# Patient Record
Sex: Male | Born: 1975 | Race: Black or African American | Hispanic: No | Marital: Single | Smoking: Former smoker
Health system: Southern US, Community
[De-identification: ages and names within clinical notes are randomized; demographics above are authoritative.]

## PROBLEM LIST (undated history)

## (undated) DIAGNOSIS — E119 Type 2 diabetes mellitus without complications: Secondary | ICD-10-CM

## (undated) DIAGNOSIS — F419 Anxiety disorder, unspecified: Secondary | ICD-10-CM

## (undated) DIAGNOSIS — I10 Essential (primary) hypertension: Secondary | ICD-10-CM

## (undated) DIAGNOSIS — G629 Polyneuropathy, unspecified: Secondary | ICD-10-CM

## (undated) DIAGNOSIS — G8929 Other chronic pain: Secondary | ICD-10-CM

## (undated) DIAGNOSIS — M1990 Unspecified osteoarthritis, unspecified site: Secondary | ICD-10-CM

## (undated) DIAGNOSIS — K219 Gastro-esophageal reflux disease without esophagitis: Secondary | ICD-10-CM

## (undated) DIAGNOSIS — C61 Malignant neoplasm of prostate: Secondary | ICD-10-CM

## (undated) DIAGNOSIS — G4730 Sleep apnea, unspecified: Secondary | ICD-10-CM

## (undated) DIAGNOSIS — G459 Transient cerebral ischemic attack, unspecified: Secondary | ICD-10-CM

## (undated) DIAGNOSIS — Z973 Presence of spectacles and contact lenses: Secondary | ICD-10-CM

## (undated) DIAGNOSIS — IMO0001 Reserved for inherently not codable concepts without codable children: Secondary | ICD-10-CM

## (undated) HISTORY — PX: OTHER SURGICAL HISTORY: SHX169

## (undated) HISTORY — DX: Type 2 diabetes mellitus without complications: E11.9

## (undated) HISTORY — PX: ELBOW FRACTURE SURGERY: SHX616

## (undated) HISTORY — PX: GASTRIC FUNDOPLICATION: SHX226

---

## 1999-12-22 ENCOUNTER — Encounter: Payer: Self-pay | Admitting: Emergency Medicine

## 1999-12-22 ENCOUNTER — Emergency Department (HOSPITAL_COMMUNITY): Admission: EM | Admit: 1999-12-22 | Discharge: 1999-12-22 | Payer: Self-pay | Admitting: *Deleted

## 2016-04-12 ENCOUNTER — Emergency Department (HOSPITAL_COMMUNITY)
Admission: EM | Admit: 2016-04-12 | Discharge: 2016-04-12 | Disposition: A | Discharge status: Home / Self Care | Payer: BLUE CROSS/BLUE SHIELD | Attending: Emergency Medicine | Admitting: Emergency Medicine

## 2016-04-12 ENCOUNTER — Encounter (HOSPITAL_COMMUNITY): Payer: Self-pay

## 2016-04-12 ENCOUNTER — Emergency Department (HOSPITAL_COMMUNITY): Payer: BLUE CROSS/BLUE SHIELD

## 2016-04-12 DIAGNOSIS — Z79899 Other long term (current) drug therapy: Secondary | ICD-10-CM | POA: Insufficient documentation

## 2016-04-12 DIAGNOSIS — M5432 Sciatica, left side: Secondary | ICD-10-CM | POA: Diagnosis not present

## 2016-04-12 DIAGNOSIS — F17200 Nicotine dependence, unspecified, uncomplicated: Secondary | ICD-10-CM | POA: Insufficient documentation

## 2016-04-12 DIAGNOSIS — M25571 Pain in right ankle and joints of right foot: Secondary | ICD-10-CM

## 2016-04-12 HISTORY — DX: Reserved for inherently not codable concepts without codable children: IMO0001

## 2016-04-12 HISTORY — DX: Gastro-esophageal reflux disease without esophagitis: K21.9

## 2016-04-12 MED ORDER — CYCLOBENZAPRINE HCL 10 MG PO TABS: 10.0000 mg | ORAL_TABLET | Freq: Two times a day (BID) | ORAL | Status: DC | PRN

## 2016-04-12 MED ORDER — TRAMADOL HCL 50 MG PO TABS: 50.0000 mg | ORAL_TABLET | Freq: Four times a day (QID) | ORAL | Status: DC | PRN

## 2016-04-12 NOTE — ED Provider Notes (Signed)
CSN: [id number]     Arrival date & time 04/12/16  1949 History   First MD Initiated Contact with Patient 04/12/16 2019     Chief Complaint  Patient presents with  . Ankle Pain     (Consider location/radiation/quality/duration/timing/severity/associated sxs/prior Treatment) HPI Mark Anthony is a 40 y.o. male who presents to the ED with right ankle pain that has been chronic for years. Patient reports that his doctor is at the [facility] hospital. Patient injured his ankle and after having an MRI reports that they dx a ruptured achilles tendon. He has had problems off and on but for the past 3 weeks his symptoms have increased. As a result he has been putting most of his weight on his left slide and now he is having lower back pain that radiates to the left buttock. He has been taking aleve without relief. Patient denies loss of control of bladder or bowels.    Past Medical History  Diagnosis Date  . Reflux    Past Surgical History  Procedure Laterality Date  . Elbow fracture surgery     History reviewed. No pertinent family history. Social History  Substance Use Topics  . Smoking status: Current Every Day Smoker  . Smokeless tobacco: None  . Alcohol Use: Yes     Comment: occasional    Review of Systems Negative except as stated in HPI   Allergies  Vicodin  Home Medications   Prior to Admission medications   Medication Sig Start Date End Date Taking? Authorizing Provider  naproxen sodium (ALEVE) 220 MG tablet Take 220 mg by mouth 2 (two) times daily as needed (pain).   Yes Historical Provider, MD  cyclobenzaprine (FLEXERIL) 10 MG tablet Take 1 tablet (10 mg total) by mouth 2 (two) times daily as needed for muscle spasms. 04/12/16   Hope Bunnie Pion, NP  traMADol (ULTRAM) 50 MG tablet Take 1 tablet (50 mg total) by mouth every 6 (six) hours as needed. 04/12/16   Hope Bunnie Pion, NP   Pulse 86  Temp(Src) 98 F (36.7 C) (Oral)  Ht 6\' 1"  (1.854 m)  Wt 124.739 kg  BMI 36.29 kg/m2  SpO2  99% Physical Exam  Constitutional: He is oriented to person, place, and time. He appears well-developed and well-nourished. No distress.  HENT:  Head: Normocephalic and atraumatic.  Eyes: EOM are normal.  Neck: Normal range of motion. Neck supple.  Cardiovascular: Normal rate and regular rhythm.   Pulmonary/Chest: Effort normal and breath sounds normal.  Abdominal: Soft. Bowel sounds are normal. There is no tenderness.  Musculoskeletal: Normal range of motion. He exhibits no edema.       Right ankle: He exhibits no swelling, no ecchymosis, no deformity, no laceration and normal pulse. Tenderness.       Lumbar back: He exhibits tenderness and spasm. He exhibits normal range of motion, no deformity and normal pulse.       Back:  Pedal pulses 2+, adequate circulation, patient can planter flex and dorsiflex without difficulty. Pain with straight leg raises on the left.   Neurological: He is alert and oriented to person, place, and time. He has normal strength. No sensory deficit. Gait normal.  Reflex Scores:      Bicep reflexes are 2+ on the right side and 2+ on the left side.      Brachioradialis reflexes are 2+ on the right side and 2+ on the left side.      Patellar reflexes are 2+ on the right side  and 2+ on the left side. Skin: Skin is warm and dry.  Psychiatric: He has a normal mood and affect. His behavior is normal.  Nursing note and vitals reviewed.   ED Course  Procedures (including critical care time) Labs Review Labs Reviewed - No data to display  Imaging Review Dg Ankle Complete Right  04/12/2016  CLINICAL DATA:  RIGHT ANKLE PAIN, PATIENT STATES " HE HAD A AN OLD INJURY TO HIS RIGHT ANKLE FROM YEARS AGO, NO RECENT INJURY BUT HIS ANKLE HAS BEEN HURTING A LOT IN THE LAST COUPLE MONTHS" EXAM: RIGHT ANKLE - COMPLETE 3+ VIEW COMPARISON:  None. FINDINGS: There is no evidence of fracture, dislocation, or joint effusion. There is no evidence of arthropathy or other focal bone  abnormality. Soft tissues are unremarkable. IMPRESSION: Negative. Electronically Signed   By: Lajean Manes M.D.   On: 04/12/2016 20:16     MDM  40 y.o. male with chronic ankle pain and new onset low back pain stable for d/c without focal neuro deficits. Will treat for muscle spasm and inflammation and patient given ortho referral. Discussed with the patient and all questioned fully answered. He will return if any problems arise.   Final diagnoses:  Sciatica, left  Ankle pain, right       [facility], NP 04/13/16 0105  Daleen Bo, MD 04/13/16 [phone]

## 2016-04-12 NOTE — ED Notes (Signed)
Pt to radiology ambulating heel to toe without change in gait- he will return to [facility] after radiology for eval

## 2016-04-12 NOTE — ED Notes (Signed)
Pt reports r ankle pain from injury in mid 2000s, followed by [facility] now with continued ankle pain as well as back pain, hip pain and bilateral pain to legs. He reports he has never been seen by an orthopaeic physician

## 2016-04-12 NOTE — Discharge Instructions (Signed)
Continue your Aleve and follow up with Dr. Aline Brochure.  Do not take the medications we give you while driving or working because they will make you sleepy.

## 2016-04-12 NOTE — ED Notes (Signed)
C/o right ankle pain X3 weeks. Patient also states left leg pain, and left lower back pain. Patient denies new injury to eight ankle, and denies injury to left leg.

## 2020-01-19 ENCOUNTER — Other Ambulatory Visit (HOSPITAL_COMMUNITY): Payer: Self-pay | Admitting: Orthopedic Surgery

## 2020-01-25 ENCOUNTER — Other Ambulatory Visit: Payer: Self-pay

## 2020-01-25 ENCOUNTER — Encounter (HOSPITAL_BASED_OUTPATIENT_CLINIC_OR_DEPARTMENT_OTHER): Payer: Self-pay | Admitting: Orthopedic Surgery

## 2020-01-30 ENCOUNTER — Other Ambulatory Visit: Payer: Self-pay

## 2020-01-30 ENCOUNTER — Other Ambulatory Visit (HOSPITAL_COMMUNITY)
Admission: RE | Admit: 2020-01-30 | Discharge: 2020-01-30 | Disposition: A | Discharge status: Home / Self Care | Payer: No Typology Code available for payment source | Source: Ambulatory Visit | Attending: Orthopedic Surgery | Admitting: Orthopedic Surgery

## 2020-01-30 DIAGNOSIS — Z20822 Contact with and (suspected) exposure to covid-19: Secondary | ICD-10-CM | POA: Insufficient documentation

## 2020-01-30 DIAGNOSIS — Z01812 Encounter for preprocedural laboratory examination: Secondary | ICD-10-CM | POA: Insufficient documentation

## 2020-01-30 LAB — SARS CORONAVIRUS 2 (TAT 6-24 HRS): SARS Coronavirus 2: NEGATIVE

## 2020-02-02 ENCOUNTER — Encounter (HOSPITAL_BASED_OUTPATIENT_CLINIC_OR_DEPARTMENT_OTHER)
Admission: RE | Disposition: A | Discharge status: Home / Self Care | Payer: Self-pay | Source: Home / Self Care | Attending: Orthopedic Surgery

## 2020-02-02 ENCOUNTER — Ambulatory Visit (HOSPITAL_BASED_OUTPATIENT_CLINIC_OR_DEPARTMENT_OTHER): Payer: No Typology Code available for payment source | Admitting: Certified Registered"

## 2020-02-02 ENCOUNTER — Ambulatory Visit (HOSPITAL_BASED_OUTPATIENT_CLINIC_OR_DEPARTMENT_OTHER)
Admission: RE | Admit: 2020-02-02 | Discharge: 2020-02-02 | Disposition: A | Discharge status: Home / Self Care | Payer: No Typology Code available for payment source | Attending: Orthopedic Surgery | Admitting: Orthopedic Surgery

## 2020-02-02 ENCOUNTER — Other Ambulatory Visit: Payer: Self-pay

## 2020-02-02 ENCOUNTER — Encounter (HOSPITAL_BASED_OUTPATIENT_CLINIC_OR_DEPARTMENT_OTHER): Payer: Self-pay | Admitting: Orthopedic Surgery

## 2020-02-02 DIAGNOSIS — E669 Obesity, unspecified: Secondary | ICD-10-CM | POA: Insufficient documentation

## 2020-02-02 DIAGNOSIS — F17200 Nicotine dependence, unspecified, uncomplicated: Secondary | ICD-10-CM | POA: Insufficient documentation

## 2020-02-02 DIAGNOSIS — F17210 Nicotine dependence, cigarettes, uncomplicated: Secondary | ICD-10-CM | POA: Diagnosis not present

## 2020-02-02 DIAGNOSIS — Z6837 Body mass index (BMI) 37.0-37.9, adult: Secondary | ICD-10-CM | POA: Diagnosis not present

## 2020-02-02 DIAGNOSIS — X58XXXA Exposure to other specified factors, initial encounter: Secondary | ICD-10-CM | POA: Diagnosis not present

## 2020-02-02 DIAGNOSIS — K219 Gastro-esophageal reflux disease without esophagitis: Secondary | ICD-10-CM | POA: Diagnosis not present

## 2020-02-02 DIAGNOSIS — I10 Essential (primary) hypertension: Secondary | ICD-10-CM | POA: Diagnosis not present

## 2020-02-02 DIAGNOSIS — M79661 Pain in right lower leg: Secondary | ICD-10-CM | POA: Diagnosis not present

## 2020-02-02 DIAGNOSIS — Z885 Allergy status to narcotic agent status: Secondary | ICD-10-CM | POA: Diagnosis not present

## 2020-02-02 DIAGNOSIS — G8929 Other chronic pain: Secondary | ICD-10-CM | POA: Insufficient documentation

## 2020-02-02 DIAGNOSIS — S86011A Strain of right Achilles tendon, initial encounter: Secondary | ICD-10-CM | POA: Insufficient documentation

## 2020-02-02 DIAGNOSIS — Y939 Activity, unspecified: Secondary | ICD-10-CM | POA: Insufficient documentation

## 2020-02-02 DIAGNOSIS — Z8673 Personal history of transient ischemic attack (TIA), and cerebral infarction without residual deficits: Secondary | ICD-10-CM | POA: Insufficient documentation

## 2020-02-02 HISTORY — PX: ACHILLES TENDON SURGERY: SHX542

## 2020-02-02 HISTORY — DX: Transient cerebral ischemic attack, unspecified: G45.9

## 2020-02-02 HISTORY — DX: Essential (primary) hypertension: I10

## 2020-02-02 SURGERY — REPAIR, TENDON, ACHILLES
Anesthesia: Regional | Site: Foot | Laterality: Right

## 2020-02-02 MED ORDER — DEXAMETHASONE SODIUM PHOSPHATE 4 MG/ML IJ SOLN
INTRAMUSCULAR | Status: DC | PRN
  Administered 2020-02-02: 5 mg via PERINEURAL
  Administered 2020-02-02: 10 mg via PERINEURAL

## 2020-02-02 MED ORDER — ACETAMINOPHEN 500 MG PO TABS
ORAL_TABLET | ORAL | Status: AC
  Filled 2020-02-02: qty 2

## 2020-02-02 MED ORDER — PROPOFOL 500 MG/50ML IV EMUL
INTRAVENOUS | Status: DC | PRN
  Administered 2020-02-02: 25 ug/kg/min via INTRAVENOUS

## 2020-02-02 MED ORDER — ACETAMINOPHEN 500 MG PO TABS
1000.0000 mg | ORAL_TABLET | Freq: Once | ORAL | Status: AC
  Administered 2020-02-02: 1000 mg via ORAL

## 2020-02-02 MED ORDER — RIVAROXABAN 10 MG PO TABS: 10.0000 mg | ORAL_TABLET | Freq: Every day | ORAL | 0 refills | Status: DC

## 2020-02-02 MED ORDER — CIPROFLOXACIN IN D5W 400 MG/200ML IV SOLN
INTRAVENOUS | Status: AC
  Filled 2020-02-02: qty 200

## 2020-02-02 MED ORDER — HYDROMORPHONE HCL 1 MG/ML IJ SOLN: 0.2500 mg | INTRAMUSCULAR | Status: DC | PRN

## 2020-02-02 MED ORDER — OXYCODONE HCL 5 MG/5ML PO SOLN: 5.0000 mg | Freq: Once | ORAL | Status: DC | PRN

## 2020-02-02 MED ORDER — FENTANYL CITRATE (PF) 100 MCG/2ML IJ SOLN
INTRAMUSCULAR | Status: AC
  Filled 2020-02-02: qty 2

## 2020-02-02 MED ORDER — OXYCODONE HCL 5 MG PO TABS: 5.0000 mg | ORAL_TABLET | Freq: Four times a day (QID) | ORAL | 0 refills | Status: AC | PRN

## 2020-02-02 MED ORDER — FENTANYL CITRATE (PF) 100 MCG/2ML IJ SOLN
50.0000 ug | INTRAMUSCULAR | Status: DC | PRN
  Administered 2020-02-02: 50 ug via INTRAVENOUS

## 2020-02-02 MED ORDER — EPHEDRINE 5 MG/ML INJ
INTRAVENOUS | Status: AC
  Filled 2020-02-02: qty 30

## 2020-02-02 MED ORDER — PROPOFOL 10 MG/ML IV BOLUS
INTRAVENOUS | Status: DC | PRN
  Administered 2020-02-02: 200 mg via INTRAVENOUS

## 2020-02-02 MED ORDER — OXYCODONE HCL 5 MG PO TABS: 5.0000 mg | ORAL_TABLET | Freq: Once | ORAL | Status: DC | PRN

## 2020-02-02 MED ORDER — SODIUM CHLORIDE 0.9 % IV SOLN: INTRAVENOUS | Status: DC

## 2020-02-02 MED ORDER — PROMETHAZINE HCL 25 MG/ML IJ SOLN: 6.2500 mg | INTRAMUSCULAR | Status: DC | PRN

## 2020-02-02 MED ORDER — DEXAMETHASONE SODIUM PHOSPHATE 10 MG/ML IJ SOLN
INTRAMUSCULAR | Status: AC
  Filled 2020-02-02: qty 1

## 2020-02-02 MED ORDER — LACTATED RINGERS IV SOLN: INTRAVENOUS | Status: DC

## 2020-02-02 MED ORDER — ONDANSETRON HCL 4 MG PO TABS: 4.0000 mg | ORAL_TABLET | Freq: Three times a day (TID) | ORAL | 1 refills | Status: DC | PRN

## 2020-02-02 MED ORDER — CEFAZOLIN SODIUM-DEXTROSE 2-4 GM/100ML-% IV SOLN
2.0000 g | INTRAVENOUS | Status: AC
  Administered 2020-02-02: 2 g via INTRAVENOUS

## 2020-02-02 MED ORDER — GLYCOPYRROLATE 0.2 MG/ML IJ SOLN
INTRAMUSCULAR | Status: DC | PRN
  Administered 2020-02-02: .1 mg via INTRAVENOUS

## 2020-02-02 MED ORDER — MIDAZOLAM HCL 2 MG/2ML IJ SOLN
INTRAMUSCULAR | Status: AC
  Filled 2020-02-02: qty 2

## 2020-02-02 MED ORDER — SUCCINYLCHOLINE CHLORIDE 20 MG/ML IJ SOLN
INTRAMUSCULAR | Status: DC | PRN
  Administered 2020-02-02: 140 mg via INTRAVENOUS

## 2020-02-02 MED ORDER — VANCOMYCIN HCL 500 MG IV SOLR
INTRAVENOUS | Status: DC | PRN
  Administered 2020-02-02: 500 mg via TOPICAL

## 2020-02-02 MED ORDER — FENTANYL CITRATE (PF) 100 MCG/2ML IJ SOLN
INTRAMUSCULAR | Status: DC | PRN
  Administered 2020-02-02: 100 ug via INTRAVENOUS

## 2020-02-02 MED ORDER — MIDAZOLAM HCL 2 MG/2ML IJ SOLN
1.0000 mg | INTRAMUSCULAR | Status: DC | PRN
  Administered 2020-02-02: 2 mg via INTRAVENOUS

## 2020-02-02 MED ORDER — KETOROLAC TROMETHAMINE 30 MG/ML IJ SOLN: 30.0000 mg | Freq: Once | INTRAMUSCULAR | Status: DC | PRN

## 2020-02-02 MED ORDER — CEFAZOLIN SODIUM-DEXTROSE 2-4 GM/100ML-% IV SOLN
INTRAVENOUS | Status: AC
  Filled 2020-02-02: qty 100

## 2020-02-02 MED ORDER — GLYCOPYRROLATE PF 0.2 MG/ML IJ SOSY
PREFILLED_SYRINGE | INTRAMUSCULAR | Status: AC
  Filled 2020-02-02: qty 1

## 2020-02-02 MED ORDER — LIDOCAINE 2% (20 MG/ML) 5 ML SYRINGE
INTRAMUSCULAR | Status: DC | PRN
  Administered 2020-02-02: 20 mg via INTRAVENOUS

## 2020-02-02 MED ORDER — ONDANSETRON HCL 4 MG/2ML IJ SOLN
INTRAMUSCULAR | Status: DC | PRN
  Administered 2020-02-02: 4 mg via INTRAVENOUS

## 2020-02-02 MED ORDER — ROPIVACAINE HCL 5 MG/ML IJ SOLN
INTRAMUSCULAR | Status: DC | PRN
  Administered 2020-02-02: 40 mL via PERINEURAL

## 2020-02-02 MED ORDER — 0.9 % SODIUM CHLORIDE (POUR BTL) OPTIME
TOPICAL | Status: DC | PRN
  Administered 2020-02-02: 120 mL

## 2020-02-02 SURGICAL SUPPLY — 99 items
APL PRP STRL LF DISP 70% ISPRP (MISCELLANEOUS) ×2
BANDAGE ESMARK 6X9 LF (GAUZE/BANDAGES/DRESSINGS) ×2 IMPLANT
BLADE AVERAGE 25MMX9MM (BLADE)
BLADE AVERAGE 25X9 (BLADE) IMPLANT
BLADE MICRO SAGITTAL (BLADE) IMPLANT
BLADE SURG 15 STRL LF DISP TIS (BLADE) ×4 IMPLANT
BLADE SURG 15 STRL SS (BLADE) ×8
BNDG CMPR 9X6 STRL LF SNTH (GAUZE/BANDAGES/DRESSINGS) ×2
BNDG COHESIVE 4X5 TAN STRL (GAUZE/BANDAGES/DRESSINGS) ×4 IMPLANT
BNDG COHESIVE 6X5 TAN STRL LF (GAUZE/BANDAGES/DRESSINGS) ×4 IMPLANT
BNDG ELASTIC 4X5.8 VLCR STR LF (GAUZE/BANDAGES/DRESSINGS) IMPLANT
BNDG ESMARK 6X9 LF (GAUZE/BANDAGES/DRESSINGS) ×4
BOOT STEPPER DURA LG (SOFTGOODS) IMPLANT
BOOT STEPPER DURA MED (SOFTGOODS) IMPLANT
BOOT STEPPER DURA XLG (SOFTGOODS) IMPLANT
CANISTER SUCT 1200ML W/VALVE (MISCELLANEOUS) ×3 IMPLANT
CHLORAPREP W/TINT 26 (MISCELLANEOUS) ×4 IMPLANT
COVER BACK TABLE 60X90IN (DRAPES) ×4 IMPLANT
COVER MAYO STAND STRL (DRAPES) ×3 IMPLANT
COVER WAND RF STERILE (DRAPES) IMPLANT
CUFF TOURN SGL QUICK 34 (TOURNIQUET CUFF)
CUFF TOURN SGL QUICK 42 (TOURNIQUET CUFF) ×3 IMPLANT
CUFF TRNQT CYL 34X4.125X (TOURNIQUET CUFF) ×1 IMPLANT
DECANTER SPIKE VIAL GLASS SM (MISCELLANEOUS) IMPLANT
DRAPE EXTREMITY T 121X128X90 (DISPOSABLE) ×4 IMPLANT
DRAPE OEC MINIVIEW 54X84 (DRAPES) IMPLANT
DRAPE U-SHAPE 47X51 STRL (DRAPES) ×4 IMPLANT
DRSG MEPITEL 4X7.2 (GAUZE/BANDAGES/DRESSINGS) ×4 IMPLANT
DRSG PAD ABDOMINAL 8X10 ST (GAUZE/BANDAGES/DRESSINGS) ×8 IMPLANT
ELECT REM PT RETURN 9FT ADLT (ELECTROSURGICAL) ×4
ELECTRODE REM PT RTRN 9FT ADLT (ELECTROSURGICAL) ×2 IMPLANT
GAUZE SPONGE 4X4 12PLY STRL (GAUZE/BANDAGES/DRESSINGS) ×4 IMPLANT
GLOVE BIO SURGEON STRL SZ8 (GLOVE) ×4 IMPLANT
GLOVE BIOGEL PI IND STRL 6.5 (GLOVE) ×3 IMPLANT
GLOVE BIOGEL PI IND STRL 7.0 (GLOVE) ×2 IMPLANT
GLOVE BIOGEL PI IND STRL 8 (GLOVE) ×4 IMPLANT
GLOVE BIOGEL PI INDICATOR 6.5 (GLOVE) ×6
GLOVE BIOGEL PI INDICATOR 7.0 (GLOVE) ×4
GLOVE BIOGEL PI INDICATOR 8 (GLOVE) ×4
GLOVE ECLIPSE 6.5 STRL STRAW (GLOVE) ×9 IMPLANT
GLOVE ECLIPSE 8.0 STRL XLNG CF (GLOVE) ×4 IMPLANT
GLOVE SURG SS PI 6.5 STRL IVOR (GLOVE) ×3 IMPLANT
GOWN STRL REUS W/ TWL LRG LVL3 (GOWN DISPOSABLE) ×4 IMPLANT
GOWN STRL REUS W/ TWL XL LVL3 (GOWN DISPOSABLE) ×4 IMPLANT
GOWN STRL REUS W/TWL LRG LVL3 (GOWN DISPOSABLE) ×12
GOWN STRL REUS W/TWL XL LVL3 (GOWN DISPOSABLE) ×8
KIT BIO-TENODESIS 3X8 DISP (MISCELLANEOUS)
KIT INSRT BABSR STRL DISP BTN (MISCELLANEOUS) IMPLANT
NDL HYPO 25X1 1.5 SAFETY (NEEDLE) IMPLANT
NDL SAFETY ECLIPSE 18X1.5 (NEEDLE) IMPLANT
NDL SUT 6 .5 CRC .975X.05 MAYO (NEEDLE) IMPLANT
NEEDLE HYPO 18GX1.5 SHARP (NEEDLE)
NEEDLE HYPO 22GX1.5 SAFETY (NEEDLE) IMPLANT
NEEDLE HYPO 25X1 1.5 SAFETY (NEEDLE) IMPLANT
NEEDLE MAYO TAPER (NEEDLE)
NS IRRIG 1000ML POUR BTL (IV SOLUTION) ×4 IMPLANT
PACK BASIN DAY SURGERY FS (CUSTOM PROCEDURE TRAY) ×4 IMPLANT
PAD CAST 4YDX4 CTTN HI CHSV (CAST SUPPLIES) ×2 IMPLANT
PADDING CAST ABS 4INX4YD NS (CAST SUPPLIES)
PADDING CAST ABS COTTON 4X4 ST (CAST SUPPLIES) IMPLANT
PADDING CAST COTTON 4X4 STRL (CAST SUPPLIES) ×4
PADDING CAST COTTON 6X4 STRL (CAST SUPPLIES) ×4 IMPLANT
PASSER SUT SWANSON 36MM LOOP (INSTRUMENTS) IMPLANT
PENCIL SMOKE EVACUATOR (MISCELLANEOUS) ×4 IMPLANT
RETRIEVER SUT HEWSON (MISCELLANEOUS) IMPLANT
SANITIZER HAND PURELL 535ML FO (MISCELLANEOUS) ×4 IMPLANT
SHEET MEDIUM DRAPE 40X70 STRL (DRAPES) ×4 IMPLANT
SLEEVE SCD COMPRESS KNEE MED (MISCELLANEOUS) ×4 IMPLANT
SPLINT FAST PLASTER 5X30 (CAST SUPPLIES) ×40
SPLINT PLASTER CAST FAST 5X30 (CAST SUPPLIES) ×40 IMPLANT
SPONGE LAP 18X18 RF (DISPOSABLE) ×4 IMPLANT
STAPLER VISISTAT 35W (STAPLE) IMPLANT
STOCKINETTE 6  STRL (DRAPES) ×4
STOCKINETTE 6 STRL (DRAPES) ×2 IMPLANT
SUCTION FRAZIER HANDLE 10FR (MISCELLANEOUS) ×4
SUCTION TUBE FRAZIER 10FR DISP (MISCELLANEOUS) ×2 IMPLANT
SUT ETHIBOND 0 MO6 C/R (SUTURE) IMPLANT
SUT ETHIBOND 2 OS 4 DA (SUTURE) IMPLANT
SUT ETHIBOND 3-0 V-5 (SUTURE) IMPLANT
SUT ETHILON 3 0 PS 1 (SUTURE) ×7 IMPLANT
SUT FIBERWIRE #2 38 T-5 BLUE (SUTURE)
SUT MERSILENE 2.0 SH NDLE (SUTURE) IMPLANT
SUT MNCRL AB 3-0 PS2 18 (SUTURE) ×7 IMPLANT
SUT VIC AB 0 CT1 27 (SUTURE)
SUT VIC AB 0 CT1 27XBRD ANBCTR (SUTURE) IMPLANT
SUT VIC AB 0 SH 27 (SUTURE) ×6 IMPLANT
SUT VIC AB 1 CT1 27 (SUTURE) ×8
SUT VIC AB 1 CT1 27XBRD ANBCTR (SUTURE) ×2 IMPLANT
SUT VIC AB 2-0 SH 18 (SUTURE) IMPLANT
SUT VIC AB 2-0 SH 27 (SUTURE)
SUT VIC AB 2-0 SH 27XBRD (SUTURE) IMPLANT
SUTURE FIBERWR #2 38 T-5 BLUE (SUTURE) IMPLANT
SYR BULB 3OZ (MISCELLANEOUS) ×4 IMPLANT
SYR CONTROL 10ML LL (SYRINGE) IMPLANT
TOWEL GREEN STERILE FF (TOWEL DISPOSABLE) ×8 IMPLANT
TUBE CONNECTING 20'X1/4 (TUBING) ×1
TUBE CONNECTING 20X1/4 (TUBING) ×3 IMPLANT
UNDERPAD 30X36 HEAVY ABSORB (UNDERPADS AND DIAPERS) ×4 IMPLANT
YANKAUER SUCT BULB TIP NO VENT (SUCTIONS) IMPLANT

## 2020-02-02 NOTE — Discharge Instructions (Addendum)
Wylene Simmer, MD [facility]  Please read the following information regarding your care after surgery.  Medications  You only need a prescription for the narcotic pain medicine (ex. oxycodone, Percocet, Norco).  All of the other medicines listed below are available over the counter. X Aleve 2 pills twice a day for the first 3 days after surgery. X acetominophen (Tylenol) 650 mg every 4-6 hours as you need for minor to moderate pain X oxycodone as prescribed for severe pain  Narcotic pain medicine (ex. oxycodone, Percocet, Vicodin) will cause constipation.  To prevent this problem, take the following medicines while you are taking any pain medicine. X docusate sodium (Colace) 100 mg twice a day X senna (Senokot) 2 tablets twice a day  X To help prevent blood clots, take Xarelto daily for 12 days, and then take a baby aspirin (81 mg) twice a day for the following month.  You should also get up every hour while you are awake to move around.    Weight Bearing ? Bear weight when you are able on your operated leg or foot. ? Bear weight only on your operated foot in the post-op shoe. X Do not bear any weight on the operated leg or foot.  Cast / Splint / Dressing X Keep your splint, cast or dressing clean and dry.  Dont put anything (coat hanger, pencil, etc) down inside of it.  If it gets damp, use a hair dryer on the cool setting to dry it.  If it gets soaked, call the office to schedule an appointment for a cast change. ? Remove your dressing 3 days after surgery and cover the incisions with dry dressings.    After your dressing, cast or splint is removed; you may shower, but do not soak or scrub the wound.  Allow the water to run over it, and then gently pat it dry.  Swelling It is normal for you to have swelling where you had surgery.  To reduce swelling and pain, keep your toes above your nose for at least 3 days after surgery.  It may be necessary to keep your foot or leg elevated for  several weeks.  If it hurts, it should be elevated.  Follow Up Call my office at [phone] when you are discharged from the hospital or surgery center to schedule an appointment to be seen two weeks after surgery.  Call my office at [phone] if you develop a fever >101.5 F, nausea, vomiting, bleeding from the surgical site or severe pain.     Post Anesthesia Home Care Instructions  Activity: Get plenty of rest for the remainder of the day. A responsible individual must stay with you for 24 hours following the procedure.  For the next 24 hours, DO NOT: -Drive a car -Paediatric nurse -Drink alcoholic beverages -Take any medication unless instructed by your physician -Make any legal decisions or sign important papers.  Meals: Start with liquid foods such as gelatin or soup. Progress to regular foods as tolerated. Avoid greasy, spicy, heavy foods. If nausea and/or vomiting occur, drink only clear liquids until the nausea and/or vomiting subsides. Call your physician if vomiting continues.  Special Instructions/Symptoms: Your throat may feel dry or sore from the anesthesia or the breathing tube placed in your throat during surgery. If this causes discomfort, gargle with warm salt water. The discomfort should disappear within 24 hours.  Call your surgeon if you experience:   1.  Fever over 101.0. 2.  Inability to urinate. 3.  Nausea and/or vomiting. 4.  Extreme swelling or bruising at the surgical site. 5.  Continued bleeding from the incision. 6.  Increased pain, redness or drainage from the incision. 7.  Problems related to your pain medication. 8.  Any problems and/or concerns   Regional Anesthesia Blocks  1. Numbness or the inability to move the "blocked" extremity may last from 3-48 hours after placement. The length of time depends on the medication injected and your individual response to the medication. If the numbness is not going away after 48 hours, call your  surgeon.  2. The extremity that is blocked will need to be protected until the numbness is gone and the  Strength has returned. Because you cannot feel it, you will need to take extra care to avoid injury. Because it may be weak, you may have difficulty moving it or using it. You may not know what position it is in without looking at it while the block is in effect.  3. For blocks in the legs and feet, returning to weight bearing and walking needs to be done carefully. You will need to wait until the numbness is entirely gone and the strength has returned. You should be able to move your leg and foot normally before you try and bear weight or walk. You will need someone to be with you when you first try to ensure you do not fall and possibly risk injury.  4. Bruising and tenderness at the needle site are common side effects and will resolve in a few days.  5. Persistent numbness or new problems with movement should be communicated to the surgeon or the [place] [phone] [place] [phone]).     *May have Tylenol at 4pm

## 2020-02-02 NOTE — Progress Notes (Signed)
Assisted Dr. Doroteo Glassman with right, ultrasound guided, popliteal, adductor canal block. Side rails up, monitors on throughout procedure. See vital signs in flow sheet. Tolerated Procedure well.

## 2020-02-02 NOTE — H&P (Signed)
Mark Anthony is an 44 y.o. male.   Chief Complaint: Right posterior ankle pain HPI: The patient is a 44 year old male with past medical history significant for smoking.  He injured his Achilles tendon on the right many years ago.  MRI shows a chronic rupture with degenerative changes in the tendon.  He has failed nonoperative treatment to date including activity modification, oral anti-inflammatories, shoewear modification and therapy.  He presents now for Achilles tendon debridement and reconstruction with possible transfer of the FHL tendon to the calcaneus.  Past Medical History:  Diagnosis Date  . Hypertension    no meds at this time.  . Reflux   . TIA (transient ischemic attack)    no meds following    Past Surgical History:  Procedure Laterality Date  . ELBOW FRACTURE SURGERY      History reviewed. No pertinent family history. Social History:  reports that he has been smoking cigarettes. He has been smoking about 0.25 packs per day. He has never used smokeless tobacco. He reports current alcohol use. He reports that he does not use drugs.  Allergies:  Allergies  Allergen Reactions  . Vicodin [Hydrocodone-Acetaminophen] Nausea And Vomiting    No medications prior to admission.    No results found for this or any previous visit (from the past 48 hour(s)). No results found.  Review of Systems no recent fever, chills, nausea, vomiting or changes in his appetite  Blood pressure 119/78, pulse 69, temperature 97.7 F (36.5 C), temperature source Tympanic, resp. rate 16, height 6\' 1"  (1.854 m), weight 128.1 kg, SpO2 100 %. Physical Exam  Well-nourished well-developed male in no apparent distress.  Alert and oriented.  Normal mood and affect.  Gait is antalgic to the right.  The right Achilles is tender to palpation in the proximal tendinous portion.  5 out of 5 strength in plantarflexion.  Heel cord is tight.  No lymphadenopathy.  Sensibility to light touch is normal throughout the  foot.  Pulses are palpable in the foot.  Assessment/Plan Chronic right Achilles tendon rupture -to the operating room today for surgical treatment.  The risks and benefits of the alternative treatment options have been discussed in detail.  The patient wishes to proceed with surgery and specifically understands risks of bleeding, infection, nerve damage, blood clots, need for additional surgery, amputation and death.   Wylene Simmer, MD 02/03/2020, 12:11 PM

## 2020-02-02 NOTE — Anesthesia Procedure Notes (Signed)
Anesthesia Regional Block: Adductor canal block   Pre-Anesthetic Checklist: ,, timeout performed, Correct Patient, Correct Site, Correct Laterality, Correct Procedure, Correct Position, site marked, Risks and benefits discussed,  Surgical consent,  Pre-op evaluation,  At surgeon's request and post-op pain management  Laterality: Right  Prep: Maximum Sterile Barrier Precautions used, chloraprep       Needles:  Injection technique: Single-shot  Needle Type: Echogenic Stimulator Needle     Needle Length: 9cm  Needle Gauge: 22     Additional Needles:   Procedures:,,,, ultrasound used (permanent image in chart),,,,  Narrative:  Start time: 02/02/2020 10:55 AM End time: 02/02/2020 11:00 AM Injection made incrementally with aspirations every 5 mL.  Performed by: Personally  Anesthesiologist: Pervis Hocking, DO  Additional Notes: Monitors applied. No increased pain on injection. No increased resistance to injection. Injection made in 5cc increments. Good needle visualization. Patient tolerated procedure well.

## 2020-02-02 NOTE — Anesthesia Postprocedure Evaluation (Signed)
Anesthesia Post Note  Patient: Mark Anthony  Procedure(s) Performed: Right Achilles tendon debridement and reconstruction (Right Foot)     Patient location during evaluation: PACU Anesthesia Type: Regional and General Level of consciousness: awake and alert, oriented and patient cooperative Pain management: pain level controlled Vital Signs Assessment: post-procedure vital signs reviewed and stable Respiratory status: spontaneous breathing, nonlabored ventilation and respiratory function stable Cardiovascular status: blood pressure returned to baseline and stable Postop Assessment: no apparent nausea or vomiting Anesthetic complications: no    Last Vitals:  Vitals:   02/02/20 1105 02/02/20 1418  BP: 119/78 (!) 140/93  Pulse: 69 92  Resp: 16 (!) 23  Temp:  36.6 C  SpO2: 100% 100%    Last Pain:  Vitals:   02/02/20 1418  TempSrc:   PainSc: [place]

## 2020-02-02 NOTE — Transfer of Care (Signed)
Immediate Anesthesia Transfer of Care Note  Patient: Mark Anthony  Procedure(s) Performed: Right Achilles tendon debridement and reconstruction (Right Foot)  Patient Location: PACU  Anesthesia Type:GA combined with regional for post-op pain  Level of Consciousness: drowsy and patient cooperative  Airway & Oxygen Therapy: Patient Spontanous Breathing and Patient connected to face mask oxygen  Post-op Assessment: Report given to RN and Post -op Vital signs reviewed and stable  Post vital signs: Reviewed and stable  Last Vitals:  Vitals Value Taken Time  BP    Temp    Pulse 92 02/02/20 1418  Resp 23 02/02/20 1418  SpO2 100 % 02/02/20 1418  Vitals shown include unvalidated device data.  Last Pain:  Vitals:   02/02/20 0953  TempSrc: Tympanic  PainSc: 0-No pain         Complications: No apparent anesthesia complications

## 2020-02-02 NOTE — Op Note (Signed)
02/02/2020  2:19 PM  PATIENT:  Mark Anthony  44 y.o. male  PRE-OPERATIVE DIAGNOSIS:  Chronic Right Achilles Tendon Rupture  POST-OPERATIVE DIAGNOSIS:  Chronic Right Achilles Tendon Rupture  Procedure(s): Right Achilles tendon debridement and reconstruction  SURGEON:  Wylene Simmer, MD  ASSISTANT: none  ANESTHESIA:   General, regional  EBL:  minimal   TOURNIQUET:   Total Tourniquet Time Documented: Thigh (Right) - 67 minutes Total: Thigh (Right) - 67 minutes  COMPLICATIONS:  None apparent  DISPOSITION:  Extubated, awake and stable to recovery.  INDICATION FOR PROCEDURE: The patient is a 44 year old man with a past medical history significant for smoking.  He injured his right ankle approximately 10 years and has had chronic pain since.  An MRI reveals a chronic rupture of the Achilles.  He has failed nonoperative treatment including activity modification, oral anti-inflammatories and therapy.  He presents now for operative treatment of this chronic Achilles tendon rupture.  The risks and benefits of the alternative treatment options have been discussed in detail.  The patient wishes to proceed with surgery and specifically understands risks of bleeding, infection, nerve damage, blood clots, need for additional surgery, amputation and death.  PROCEDURE IN DETAIL after preoperative consent was obtained and the correct operative site was identified, the patient was brought to the operating room supine on a stretcher.  General anesthesia was induced.  Preoperative antibiotics were administered.  Surgical timeout was taken.  The right lower extremity was exsanguinated and a thigh tourniquet inflated to 250 mmHg.  The patient was then turned into the prone position on the operating table with all bony prominences padded well.  Both ankles were carefully examined.  The left uninjured ankle was noted to have resting prone plantarflexion of approximately 15 degrees.  The right lower extremity was  noted to have no resting prone plantarflexion.  The right lower extremity was prepped and draped in standard sterile fashion.  A longitudinal incision was made over the palpable thickening in the Achilles tendon.  The incision was carried sharply down through the subcutaneous tissues to the tendon creating full-thickness flaps medially and laterally.  The peritenon was identified.  It was dissected free from the tendon medially and laterally.  The peritenon was noted to be quite thin and adherent to the tendon almost circumferentially.  The fascia over the deep posterior compartment was incised and released proximally and distally.  The FHL muscle belly was identified and appeared healthy.  The Achilles tendon was split longitudinally in the midline.  The area of rupture was identified.  It was oblique from proximal posterior to distal anterior.  The area of scarred tendon was sharply excised with a scalpel leaving healthy appearing tendon on either side of the rupture.  The wound was irrigated copiously.  Vancomycin powder was sprinkled in the deep layer.  The tendon was repaired with horizontal mattress sutures medially and with Bennell sutures laterally.  The tendon was shortened by approximately 15 mm.  The ankle was noted to rest in 15 degrees of plantarflexion after the repair.  3-0 Monocryl was used to place a running Silfverskiold stitch circumferentially around the repair.  The wound was irrigated.  More vancomycin powder was sprinkled over the tendon.  The peritenon and subcutaneous tissues were approximated with inverted simple sutures of 3-0 Monocryl.  The skin incision was closed with horizontal mattress sutures of 3-0 nylon.  Sterile dressings were applied followed by a well-padded short leg splint with the ankle in maximal plantarflexion.  Tourniquet  was released after application of the dressings.  The patient was awakened from anesthesia and transported to the recovery room in stable  condition.  FOLLOW UP PLAN: Nonweightbearing on the right lower extremity for 6 weeks postop.  Follow-up in the office in 2 weeks for suture removal and conversion to a plantarflexion cast.  We will slowly dorsiflex him to neutral by 6 weeks postop at which time he will go into a cam boot.  Xarelto for DVT prophylaxis given his ongoing smoking.

## 2020-02-02 NOTE — Anesthesia Preprocedure Evaluation (Addendum)
Anesthesia Evaluation  Patient identified by MRN, date of birth, ID band Patient awake    Reviewed: Allergy & Precautions, NPO status , Patient's Chart, lab work & pertinent test results  Airway Mallampati: III  TM Distance: >3 FB Neck ROM: Full    Dental no notable dental hx. (+) Teeth Intact, Dental Advisory Given   Pulmonary Current Smoker and Patient abstained from smoking.,  <1ppd x 15 years    Pulmonary exam normal breath sounds clear to auscultation       Cardiovascular hypertension, Normal cardiovascular exam Rhythm:Regular Rate:Normal  HTN- no meds   Neuro/Psych TIAnegative psych ROS   GI/Hepatic Neg liver ROS, GERD  Controlled,  Endo/Other  Obesity BMI 37  Renal/GU negative Renal ROS  negative genitourinary   Musculoskeletal Acquired short right achilles tendon   Abdominal (+) + obese,   Peds negative pediatric ROS (+)  Hematology negative hematology ROS (+)   Anesthesia Other Findings   Reproductive/Obstetrics negative OB ROS                           Anesthesia Physical Anesthesia Plan  ASA: III  Anesthesia Plan: General and Regional   Post-op Pain Management: GA combined w/ Regional for post-op pain   Induction: Intravenous  PONV Risk Score and Plan: 1 and Ondansetron, Dexamethasone, Midazolam and Treatment may vary due to age or medical condition  Airway Management Planned: LMA  Additional Equipment: None  Intra-op Plan:   Post-operative Plan: Extubation in OR  Informed Consent: I have reviewed the patients History and Physical, chart, labs and discussed the procedure including the risks, benefits and alternatives for the proposed anesthesia with the patient or authorized representative who has indicated his/her understanding and acceptance.     Dental advisory given  Plan Discussed with: CRNA  Anesthesia Plan Comments:         Anesthesia Quick  Evaluation

## 2020-02-02 NOTE — Anesthesia Procedure Notes (Signed)
Anesthesia Regional Block: Popliteal block   Pre-Anesthetic Checklist: ,, timeout performed, Correct Patient, Correct Site, Correct Laterality, Correct Procedure, Correct Position, site marked, Risks and benefits discussed,  Surgical consent,  Pre-op evaluation,  At surgeon's request and post-op pain management  Laterality: Right  Prep: Maximum Sterile Barrier Precautions used, chloraprep       Needles:  Injection technique: Single-shot  Needle Type: Echogenic Stimulator Needle     Needle Length: 9cm  Needle Gauge: 22     Additional Needles:   Procedures:,,,, ultrasound used (permanent image in chart),,,,  Narrative:  Start time: 02/02/2020 10:50 AM End time: 02/02/2020 10:55 AM Injection made incrementally with aspirations every 5 mL.  Performed by: Personally  Anesthesiologist: Pervis Hocking, DO  Additional Notes: Monitors applied. No increased pain on injection. No increased resistance to injection. Injection made in 5cc increments. Good needle visualization. Patient tolerated procedure well.

## 2020-02-02 NOTE — Anesthesia Procedure Notes (Signed)
Procedure Name: Intubation Date/Time: 02/02/2020 12:46 PM Performed by: Signe Colt, CRNA Pre-anesthesia Checklist: Patient identified, Emergency Drugs available, Suction available and Patient being monitored Patient Re-evaluated:Patient Re-evaluated prior to induction Oxygen Delivery Method: Circle system utilized Preoxygenation: Pre-oxygenation with 100% oxygen Induction Type: IV induction Ventilation: Mask ventilation without difficulty Laryngoscope Size: Glidescope and 4 Grade View: Grade I Tube type: Oral Tube size: 7.5 mm Number of attempts: 1 Airway Equipment and Method: Stylet and Oral airway Placement Confirmation: ETT inserted through vocal cords under direct vision,  positive ETCO2 and breath sounds checked- equal and bilateral Secured at: 23 cm Tube secured with: Tape Dental Injury: Teeth and Oropharynx as per pre-operative assessment  Difficulty Due To: Difficulty was anticipated

## 2020-02-03 ENCOUNTER — Encounter: Payer: Self-pay | Admitting: *Deleted

## 2020-05-30 ENCOUNTER — Encounter (HOSPITAL_COMMUNITY): Payer: Self-pay | Admitting: Physical Therapy

## 2020-05-30 ENCOUNTER — Other Ambulatory Visit: Payer: Self-pay

## 2020-05-30 ENCOUNTER — Ambulatory Visit (HOSPITAL_COMMUNITY): Payer: No Typology Code available for payment source | Attending: Student | Admitting: Physical Therapy

## 2020-05-30 DIAGNOSIS — M6281 Muscle weakness (generalized): Secondary | ICD-10-CM | POA: Insufficient documentation

## 2020-05-30 DIAGNOSIS — R2689 Other abnormalities of gait and mobility: Secondary | ICD-10-CM | POA: Diagnosis present

## 2020-05-30 DIAGNOSIS — R29898 Other symptoms and signs involving the musculoskeletal system: Secondary | ICD-10-CM | POA: Insufficient documentation

## 2020-05-30 DIAGNOSIS — M25571 Pain in right ankle and joints of right foot: Secondary | ICD-10-CM | POA: Diagnosis present

## 2020-05-30 NOTE — Therapy (Signed)
[facility] [place], [place], [address] Phone: [phone]   Fax:  [phone]  Physical Therapy Evaluation  Patient Details  Name: Mark Anthony MRN: [id number] Date of Birth: 04/07/1976 Referring Provider (PT): Mechele Claude PA-C   Encounter Date: 05/30/2020   PT End of Session - 05/30/20 1039    Visit Number 1    Number of Visits 12    Date for PT Re-Evaluation 07/11/20    Authorization Type [facility] (15 visits approved- auth required)    Authorization - Visit Number 1    Authorization - Number of Visits 15    Progress Note Due on Visit 10    PT Start Time 1030    PT Stop Time 1114    PT Time Calculation (min) 44 min    Activity Tolerance Patient tolerated treatment well    Behavior During Therapy [facility] for tasks assessed/performed           Past Medical History:  Diagnosis Date  . Hypertension    no meds at this time.  . Reflux   . TIA (transient ischemic attack)    no meds following    Past Surgical History:  Procedure Laterality Date  . ACHILLES TENDON SURGERY Right 02/02/2020   Procedure: Right Achilles tendon debridement and reconstruction;  Surgeon: Wylene Simmer, MD;  Location: [facility];  Service: Orthopedics;  Laterality: Right;  . ELBOW FRACTURE SURGERY      There were no vitals filed for this visit.    Subjective Assessment - 05/30/20 1029    Subjective Patient is a 44 y.o. male who presents to physical therapy with c/o right ankle pain following rupture of Achilles tendon and repair on 02/02/20. Patient states his MD said he was finally ready to do the rehab. The main thing that bothers him is the numbness at the toes, pain at night in foot, water feeling, pain in the arch. Patient having trouble with stairs, walking, squatting. Pain is worse with weightbearing and when off balance. Patient has not found anything that helps with the pain. Patient states his main goal is to get back to walking  normal. His job requires him to stand for 10-12 hours. He is concerned that he can not perform his work duties at this time. Patient states original rupture about 15 years ago.    Limitations Lifting;Standing;Walking;House hold activities    How Jourdan can you stand comfortably? 5-10 minutes    How Massimo can you walk comfortably? 5 minutes    Patient Stated Goals Get back to walking normal    Currently in Pain? Yes    Pain Score 5     Pain Location Ankle   foot   Pain Orientation Right              [facility] PT Assessment - 05/30/20 0001      Assessment   Medical Diagnosis Rupture of R achilles tendon    Referring Provider (PT) Mechele Claude PA-C    Onset Date/Surgical Date 02/02/20    Next MD Visit Aug 9    Prior Therapy no      Precautions   Precautions None      Restrictions   Weight Bearing Restrictions No      Balance Screen   Has the patient fallen in the past 6 months No    Has the patient had a decrease in activity level because of a fear of falling?  Yes  Is the patient reluctant to leave their home because of a fear of falling?  No      Prior Function   Level of Independence Independent    Vocation Full time employment    Vocation Requirements Standing, walking      Cognition   Overall Cognitive Status Within Functional Limits for tasks assessed      Observation/Other Assessments   Observations Ambulates with antalgic gait on RLE with restricted ankle movements without AD, slight edema at anterior and lateral joint line, decreased medial arch bilateral in seated    Focus on Therapeutic Outcomes (FOTO)  72% limited      ROM / Strength   AROM / PROM / Strength AROM;Strength      AROM   Overall AROM Comments restricted on R    AROM Assessment Site Ankle    Right/Left Ankle Right;Left    Right Ankle Dorsiflexion 10   lacking   Right Ankle Plantar Flexion 27    Right Ankle Inversion 15    Right Ankle Eversion 1    Left Ankle Dorsiflexion 11    Left Ankle  Plantar Flexion 40    Left Ankle Inversion 30    Left Ankle Eversion 30      Strength   Strength Assessment Site Hip;Knee;Ankle    Right/Left Hip Right;Left    Right Hip Flexion 5/5    Left Hip Flexion 5/5    Right/Left Knee Right;Left    Right Knee Flexion 5/5    Right Knee Extension 5/5    Left Knee Flexion 5/5    Left Knee Extension 5/5    Right/Left Ankle Right;Left    Right Ankle Dorsiflexion 4/5    Right Ankle Inversion 2+/5    Right Ankle Eversion 2+/5    Left Ankle Dorsiflexion 5/5    Left Ankle Inversion 5/5    Left Ankle Eversion 5/5      Palpation   Palpation comment TTP R calf muscle belly, heel, plantar surface of foot throughout fascia and metatarsals; scar tissue under surgical incision posterior R ankle      Ambulation/Gait   Ambulation/Gait Yes    Ambulation/Gait Assistance 7: Independent    Ambulation Distance (Feet) 240 Feet    Assistive device None    Gait Pattern Step-through pattern;Right foot flat;Antalgic;Wide base of support   foot in neutral with no ankle movement on R throughout gait    Ambulation Surface Level;Indoor    Gait velocity decreased    Gait Comments 2 MWT                      Objective measurements completed on examination: See above findings.       [facility] Adult PT Treatment/Exercise - 05/30/20 0001      Exercises   Exercises Ankle      Ankle Exercises: Stretches   Gastroc Stretch 2 reps;20 seconds    Gastroc Stretch Limitations with towel                  PT Education - 05/30/20 1029    Education Details Patient educated on exam findings, POC, scope of PT, initial HEP    Person(s) Educated Patient    Methods Explanation;Demonstration;Handout    Comprehension Verbalized understanding;Returned demonstration            PT Short Term Goals - 05/30/20 1240      PT SHORT TERM GOAL #1   Title Patient will be independent with HEP  in order to improve functional outcomes.    Time 3    Period Weeks     Status New    Target Date 06/20/20      PT SHORT TERM GOAL #2   Title Patient will report at least 25% improvement in symptoms for improved quality of life.    Time 3    Period Weeks    Status New    Target Date 06/20/20             PT [place] Term Goals - 05/30/20 1241      PT [place] TERM GOAL #1   Title Patient will report at least 75% improvement in symptoms for improved quality of life.    Time 6    Period Weeks    Status New    Target Date 07/11/20      PT Velasquez TERM GOAL #2   Title Patient will improve FOTO score by at least 10 points in order to indicate improved tolerance to activity.    Time 6    Period Weeks    Status New    Target Date 07/11/20      PT [place] TERM GOAL #3   Title Patient will be able to navigate stairs with reciprocal pattern without compensation in order to demonstrate improved LE strength.    Time 6    Period Weeks    Status New    Target Date 07/11/20      PT [place] TERM GOAL #4   Title Patient will be able to ambulate for at least 24minutes with pain no greater than 1/10 in order to demonstrate improved ability to ambulate in the community.    Time 6    Period Weeks    Status New    Target Date 07/11/20                  Plan - 05/30/20 1207    Clinical Impression Statement Patient is a 44 y.o. male who presents to physical therapy with c/o right ankle pain following rupture of Achilles tendon and repair on 02/02/20.  He presents with pain limited deficits in R ankle strength, ROM, gait, transfers, edema, hyperactive/tender musculature, balance, and functional mobility with ADL. He is having to modify and restrict ADL as indicated by FOTO score as well as subjective information and objective measures which is affecting overall participation. Patient will benefit from skilled physical therapy in order to improve function and reduce impairment.    Personal Factors and Comorbidities Comorbidity 3+;Time since onset of  injury/illness/exacerbation;Past/Current Experience;Profession    Comorbidities obesity, hx TIA, HTN    Examination-Activity Limitations Lift;Stand;Stairs;Squat;Locomotion Level;Transfers    Examination-Participation Restrictions Lawyer;Shop;Occupation;Meal Prep    Stability/Clinical Decision Making Stable/Uncomplicated    Clinical Decision Making Low    Rehab Potential Fair    PT Frequency 2x / week    PT Duration 6 weeks    PT Treatment/Interventions ADLs/Self Care Home Management;Aquatic Therapy;Biofeedback;Canalith Repostioning;Cryotherapy;Electrical Stimulation;Iontophoresis 4mg /ml Dexamethasone;Moist Heat;Traction;Ultrasound;Parrafin;Fluidtherapy;Contrast Bath;DME Instruction;Gait training;Stair training;Functional mobility training;Therapeutic activities;Therapeutic exercise;Balance training;Orthotic Fit/Training;Patient/family education;Neuromuscular re-education;Manual lymph drainage;Manual techniques;Compression bandaging;Scar mobilization;Passive range of motion;Dry needling;Energy conservation;Splinting;Taping;Vasopneumatic Device;Vestibular;Spinal Manipulations;Joint Manipulations    PT Next Visit Plan assess response to initial HEP, further assess balance, Progress ankle ROM/mobility and strengthing as able. possibly begin manual for mobility/pain and scar mobility, begin with stretches, AROM and resistance with band    PT Home Exercise Plan 05/30/20 calf stretch    Consulted and Agree with Plan of Care Patient  Patient will benefit from skilled therapeutic intervention in order to improve the following deficits and impairments:  Abnormal gait, Decreased activity tolerance, Decreased balance, Decreased mobility, Decreased range of motion, Decreased scar mobility, Decreased endurance, Decreased knowledge of precautions, Increased edema, Hypomobility, Difficulty walking, Pain, Increased muscle spasms, Improper body mechanics, Impaired flexibility  Visit  Diagnosis: Pain in right ankle and joints of right foot  Other abnormalities of gait and mobility  Muscle weakness (generalized)  Other symptoms and signs involving the musculoskeletal system     Problem List There are no problems to display for this patient.   12:45 PM, 05/30/20 Mearl Latin PT, DPT Physical Therapist at [facility] [place], [place], [address] Phone: [phone]   Fax:  [phone]  Name: Wendel Homeyer MRN: [id number] Date of Birth: 08-12-1976

## 2020-06-01 ENCOUNTER — Ambulatory Visit (HOSPITAL_COMMUNITY): Payer: No Typology Code available for payment source | Admitting: Physical Therapy

## 2020-06-01 ENCOUNTER — Other Ambulatory Visit: Payer: Self-pay

## 2020-06-01 ENCOUNTER — Encounter (HOSPITAL_COMMUNITY): Payer: Self-pay | Admitting: Physical Therapy

## 2020-06-01 DIAGNOSIS — M6281 Muscle weakness (generalized): Secondary | ICD-10-CM

## 2020-06-01 DIAGNOSIS — R2689 Other abnormalities of gait and mobility: Secondary | ICD-10-CM

## 2020-06-01 DIAGNOSIS — M25571 Pain in right ankle and joints of right foot: Secondary | ICD-10-CM | POA: Diagnosis not present

## 2020-06-01 NOTE — Therapy (Signed)
[facility] [place], [place], [address] Phone: [phone]   Fax:  [phone]  Physical Therapy Treatment  Patient Details  Name: Mark Anthony MRN: [id number] Date of Birth: 05/16/1976 Referring Provider (PT): Mechele Claude PA-C   Encounter Date: 06/01/2020   PT End of Session - 06/01/20 1617    Visit Number 2    Number of Visits 12    Date for PT Re-Evaluation 07/11/20    Authorization Type [facility] (15 visits approved- auth required)    Authorization - Visit Number 2    Authorization - Number of Visits 15    Progress Note Due on Visit 10    PT Start Time 1522    PT Stop Time 1602    PT Time Calculation (min) 40 min    Activity Tolerance Patient tolerated treatment well    Behavior During Therapy [facility] for tasks assessed/performed           Past Medical History:  Diagnosis Date  . Hypertension    no meds at this time.  . Reflux   . TIA (transient ischemic attack)    no meds following    Past Surgical History:  Procedure Laterality Date  . ACHILLES TENDON SURGERY Right 02/02/2020   Procedure: Right Achilles tendon debridement and reconstruction;  Surgeon: Wylene Simmer, MD;  Location: [place];  Service: Orthopedics;  Laterality: Right;  . ELBOW FRACTURE SURGERY      There were no vitals filed for this visit.   Subjective Assessment - 06/01/20 1524    Subjective Patient reported he is having stiffness, but denied any pain.    Limitations Lifting;Standing;Walking;House hold activities    How Goetze can you stand comfortably? 5-10 minutes    How Froberg can you walk comfortably? 5 minutes    Patient Stated Goals Get back to walking normal    Currently in Pain? No/denies              [facility] PT Assessment - 06/01/20 0001      Balance   Balance Assessed Yes      Static Standing Balance   Static Standing - Balance Support No upper extremity supported    Static Standing Balance -  Activities  Single  Leg Stance - Right Leg;Single Leg Stance - Left Leg    Static Standing - Comment/# of Minutes RT LE 2 seconds. Unable to attain tandem position due to decreased DF ROM.                          [facility] Adult PT Treatment/Exercise - 06/01/20 0001      Ambulation/Gait   Ambulation/Gait Yes    Ambulation/Gait Assistance 7: Independent    Ambulation Distance (Feet) 226 Feet    Assistive device None    Gait Pattern Step-through pattern;Right foot flat;Antalgic;Wide base of support    Ambulation Surface Level;Indoor    Gait Comments Cues for heel to toe roll through foot. Decreased DF limiting stance time      Ankle Exercises: Stretches   Gastroc Stretch 20 seconds;3 reps    Gastroc Stretch Limitations With blue strap      Ankle Exercises: Seated   ABC's 1 rep    Other Seated Ankle Exercises Theraband DF, INV, EV, and PF x10 each RTB      Ankle Exercises: Standing   Other Standing Ankle Exercises Gentle DF stretch at stair x 1 minute  PT Short Term Goals - 06/01/20 1524      PT SHORT TERM GOAL #1   Title Patient will be independent with HEP in order to improve functional outcomes.    Time 3    Period Weeks    Status On-going    Target Date 06/20/20      PT SHORT TERM GOAL #2   Title Patient will report at least 25% improvement in symptoms for improved quality of life.    Time 3    Period Weeks    Status On-going    Target Date 06/20/20             PT Negro Term Goals - 06/01/20 1528      PT [place] TERM GOAL #1   Title Patient will report at least 75% improvement in symptoms for improved quality of life.    Time 6    Period Weeks    Status On-going      PT [place] TERM GOAL #2   Title Patient will improve FOTO score by at least 10 points in order to indicate improved tolerance to activity.    Time 6    Period Weeks    Status On-going      PT Pet[place] TERM GOAL #3   Title Patient will be able to navigate stairs with reciprocal  pattern without compensation in order to demonstrate improved LE strength.    Time 6    Period Weeks    Status On-going      PT [place] TERM GOAL #4   Title Patient will be able to ambulate for at least 64minutes with pain no greater than 1/10 in order to demonstrate improved ability to ambulate in the community.    Time 6    Period Weeks    Status On-going                 Plan - 06/01/20 1620    Clinical Impression Statement Began by reviewing patient's goals and HEP. Introduced resistance band exercises this session to focus on targeted strengthening. Patient with minimal increase in pain, but pain resolved following moments of rest. Performed ambulation with noted decreased stance time on the RT LE as patient does not have enough DF. Performed standing gentle DF stretch due to this. Patient would benefit from continued skilled physical therapy in order to improve patient's strength, ROM and overall mobility.    Personal Factors and Comorbidities Comorbidity 3+;Time since onset of injury/illness/exacerbation;Past/Current Experience;Profession    Comorbidities obesity, hx TIA, HTN    Examination-Activity Limitations Lift;Stand;Stairs;Squat;Locomotion Level;Transfers    Examination-Participation Restrictions Lawyer;Shop;Occupation;Meal Prep    Stability/Clinical Decision Making Stable/Uncomplicated    Rehab Potential Fair    PT Frequency 2x / week    PT Duration 6 weeks    PT Treatment/Interventions ADLs/Self Care Home Management;Aquatic Therapy;Biofeedback;Canalith Repostioning;Cryotherapy;Electrical Stimulation;Iontophoresis 4mg /ml Dexamethasone;Moist Heat;Traction;Ultrasound;Parrafin;Fluidtherapy;Contrast Bath;DME Instruction;Gait training;Stair training;Functional mobility training;Therapeutic activities;Therapeutic exercise;Balance training;Orthotic Fit/Training;Patient/family education;Neuromuscular re-education;Manual lymph drainage;Manual techniques;Compression  bandaging;Scar mobilization;Passive range of motion;Dry needling;Energy conservation;Splinting;Taping;Vasopneumatic Device;Vestibular;Spinal Manipulations;Joint Manipulations    PT Next Visit Plan Add resistance bands to HEP as able.  Progress ankle ROM/mobility and strengthing as able. possibly begin manual for mobility/pain and scar mobility, begin with stretches, AROM and resistance with band    PT Home Exercise Plan 05/30/20 calf stretch    Consulted and Agree with Plan of Care Patient           Patient will benefit from skilled therapeutic intervention in order to improve the following  deficits and impairments:  Abnormal gait, Decreased activity tolerance, Decreased balance, Decreased mobility, Decreased range of motion, Decreased scar mobility, Decreased endurance, Decreased knowledge of precautions, Increased edema, Hypomobility, Difficulty walking, Pain, Increased muscle spasms, Improper body mechanics, Impaired flexibility  Visit Diagnosis: Pain in right ankle and joints of right foot  Other abnormalities of gait and mobility  Muscle weakness (generalized)     Problem List There are no problems to display for this patient.  Clarene Critchley PT, DPT 4:23 PM, 06/01/20 [facility] [place], [place], [address] Phone: [phone]   Fax:  [phone]  Name: Mark Anthony MRN: [id number] Date of Birth: Oct 22, 1976

## 2020-06-05 ENCOUNTER — Ambulatory Visit (HOSPITAL_COMMUNITY): Payer: No Typology Code available for payment source

## 2020-06-05 ENCOUNTER — Other Ambulatory Visit: Payer: Self-pay

## 2020-06-05 ENCOUNTER — Encounter (HOSPITAL_COMMUNITY): Payer: Self-pay

## 2020-06-05 DIAGNOSIS — R2689 Other abnormalities of gait and mobility: Secondary | ICD-10-CM

## 2020-06-05 DIAGNOSIS — M25571 Pain in right ankle and joints of right foot: Secondary | ICD-10-CM | POA: Diagnosis not present

## 2020-06-05 DIAGNOSIS — R29898 Other symptoms and signs involving the musculoskeletal system: Secondary | ICD-10-CM

## 2020-06-05 DIAGNOSIS — M6281 Muscle weakness (generalized): Secondary | ICD-10-CM

## 2020-06-05 NOTE — Therapy (Signed)
[facility] [place], [place], [address] Phone: [phone]   Fax:  [phone]  Physical Therapy Treatment  Patient Details  Name: Mark Anthony MRN: [id number] Date of Birth: [birth year] Referring Provider (PT): Mechele Claude PA-C   Encounter Date: 06/05/2020   PT End of Session - 06/05/20 1222    Visit Number 3    Number of Visits 12    Date for PT Re-Evaluation 07/11/20    Authorization Type [facility] (15 visits approved- auth required)    Authorization - Visit Number 3    Authorization - Number of Visits 15    Progress Note Due on Visit 10    PT Start Time 1140    PT Stop Time 1220    PT Time Calculation (min) 40 min    Activity Tolerance Patient tolerated treatment well    Behavior During Therapy [facility] for tasks assessed/performed           Past Medical History:  Diagnosis Date  . Hypertension    no meds at this time.  . Reflux   . TIA (transient ischemic attack)    no meds following    Past Surgical History:  Procedure Laterality Date  . ACHILLES TENDON SURGERY Right 02/02/2020   Procedure: Right Achilles tendon debridement and reconstruction;  Surgeon: Wylene Simmer, MD;  Location: [facility];  Service: Orthopedics;  Laterality: Right;  . ELBOW FRACTURE SURGERY      There were no vitals filed for this visit.   Subjective Assessment - 06/05/20 1146    Subjective Pt stated he's been on his feet a lot, reports of increased pain today.    Patient Stated Goals Get back to walking normal    Currently in Pain? Yes    Pain Score 6     Pain Location Ankle   posterior ankle and arch   Pain Orientation Right    Pain Descriptors / Indicators Sharp    Pain Type Surgical pain    Pain Onset More than a month ago    Pain Frequency Intermittent    Aggravating Factors  weight bearing    Pain Relieving Factors sitting, aleve                             [facility] Adult PT Treatment/Exercise -  06/05/20 0001      Ambulation/Gait   Ambulation/Gait Yes    Ambulation/Gait Assistance 7: Independent    Ambulation Distance (Feet) 226 Feet    Assistive device None    Gait Pattern Step-through pattern;Right foot flat;Antalgic;Wide base of support    Ambulation Surface Level;Indoor    Gait Comments cueing for equal stance phase and heel and toe mechanics      Exercises   Exercises Ankle      Ankle Exercises: Stretches   Slant Board Stretch 3 reps;30 seconds      Ankle Exercises: Standing   Rocker Board 2 minutes    Rocker Board Limitations lateral      Ankle Exercises: Supine   T-Band Df, Pf, Inv, Ev                    PT Short Term Goals - 06/01/20 1524      PT SHORT TERM GOAL #1   Title Patient will be independent with HEP in order to improve functional outcomes.    Time 3    Period Weeks  Status On-going    Target Date 06/20/20      PT SHORT TERM GOAL #2   Title Patient will report at least 25% improvement in symptoms for improved quality of life.    Time 3    Period Weeks    Status On-going    Target Date 06/20/20             PT [place] Term Goals - 06/01/20 1528      PT Thib[place] TERM GOAL #1   Title Patient will report at least 75% improvement in symptoms for improved quality of life.    Time 6    Period Weeks    Status On-going      PT Ka[place] TERM GOAL #2   Title Patient will improve FOTO score by at least 10 points in order to indicate improved tolerance to activity.    Time 6    Period Weeks    Status On-going      PT [place] TERM GOAL #3   Title Patient will be able to navigate stairs with reciprocal pattern without compensation in order to demonstrate improved LE strength.    Time 6    Period Weeks    Status On-going      PT B[place] TERM GOAL #4   Title Patient will be able to ambulate for at least 67minutes with pain no greater than 1/10 in order to demonstrate improved ability to ambulate in the community.    Time 6    Period Weeks     Status On-going                 Plan - 06/05/20 1224    Clinical Impression Statement Pt arrived with antalgic gait mechanics, decreased ankle mobility and decreased stance phase due to pain.  Added rockerboard to improve stance phase during gait, stretches to address ankle mobility and added theraband strengthening to HEP.  Pt limited by pain and reports of "pulling" through session, reviewed techniques for pain and edema control.    Personal Factors and Comorbidities Comorbidity 3+;Time since onset of injury/illness/exacerbation;Past/Current Experience;Profession    Comorbidities obesity, hx TIA, HTN    Examination-Activity Limitations Lift;Stand;Stairs;Squat;Locomotion Level;Transfers    Examination-Participation Restrictions Lawyer;Shop;Occupation;Meal Prep    Stability/Clinical Decision Making Stable/Uncomplicated    Clinical Decision Making Low    Rehab Potential Fair    PT Frequency 2x / week    PT Duration 6 weeks    PT Treatment/Interventions ADLs/Self Care Home Management;Aquatic Therapy;Biofeedback;Canalith Repostioning;Cryotherapy;Electrical Stimulation;Iontophoresis 4mg /ml Dexamethasone;Moist Heat;Traction;Ultrasound;Parrafin;Fluidtherapy;Contrast Bath;DME Instruction;Gait training;Stair training;Functional mobility training;Therapeutic activities;Therapeutic exercise;Balance training;Orthotic Fit/Training;Patient/family education;Neuromuscular re-education;Manual lymph drainage;Manual techniques;Compression bandaging;Scar mobilization;Passive range of motion;Dry needling;Energy conservation;Splinting;Taping;Vasopneumatic Device;Vestibular;Spinal Manipulations;Joint Manipulations    PT Next Visit Plan Review mechanics with HEP theraband.  Progress ankle ROM/mobility and strengthing as able. possibly begin manual for mobility/pain and scar mobility, begin with stretches, AROM and resistance with band    PT Home Exercise Plan 05/30/20 calf stretch; 06/05/20: RTB with  printout for 4 directions.           Patient will benefit from skilled therapeutic intervention in order to improve the following deficits and impairments:  Abnormal gait, Decreased activity tolerance, Decreased balance, Decreased mobility, Decreased range of motion, Decreased scar mobility, Decreased endurance, Decreased knowledge of precautions, Increased edema, Hypomobility, Difficulty walking, Pain, Increased muscle spasms, Improper body mechanics, Impaired flexibility  Visit Diagnosis: Pain in right ankle and joints of right foot  Other abnormalities of gait and mobility  Muscle weakness (generalized)  Other  symptoms and signs involving the musculoskeletal system     Problem List There are no problems to display for this patient.  Ihor Austin, LPTA/CLT; CBIS [phone]  Aldona Lento 06/05/2020, 1:02 PM  [place] [address] [place], [place], [address] Phone: [phone]   Fax:  [phone]  Name: Mark Anthony MRN: [id number] Date of Birth: 08/07/1976

## 2020-06-05 NOTE — Patient Instructions (Signed)
Inversion: Resisted   Cross legs with right leg underneath, foot in tubing loop. Hold tubing around other foot to resist and turn foot in. Repeat 10 times per set. Do 2 sets per session. Do 4 sessions per week.  http://orth.exer.us/12   Copyright  VHI. All rights reserved.  Eversion: Resisted   With right foot in tubing loop, hold tubing around other foot to resist and turn foot out. Repeat 10 times per set. Do 2 sets per session. Do 4 sessions per week.  http://orth.exer.us/14   Copyright  VHI. All rights reserved.  Plantar Flexion: Resisted   Anchor behind, tubing around left foot, press down. Repeat 10 times per set. Do 2 sets per session. Do 4 sessions per week.  http://orth.exer.us/10   Copyright  VHI. All rights reserved.  Dorsiflexion: Resisted   Facing anchor, tubing around left foot, pull toward face.  Repeat 10 times per set. Do 2 sets per session. Do 4 sessions per week.  http://orth.exer.us/8   Copyright  VHI. All rights reserved.

## 2020-06-08 ENCOUNTER — Other Ambulatory Visit: Payer: Self-pay

## 2020-06-08 ENCOUNTER — Ambulatory Visit (HOSPITAL_COMMUNITY): Payer: No Typology Code available for payment source

## 2020-06-08 ENCOUNTER — Encounter (HOSPITAL_COMMUNITY): Payer: Self-pay

## 2020-06-08 DIAGNOSIS — M6281 Muscle weakness (generalized): Secondary | ICD-10-CM

## 2020-06-08 DIAGNOSIS — R29898 Other symptoms and signs involving the musculoskeletal system: Secondary | ICD-10-CM

## 2020-06-08 DIAGNOSIS — R2689 Other abnormalities of gait and mobility: Secondary | ICD-10-CM

## 2020-06-08 DIAGNOSIS — M25571 Pain in right ankle and joints of right foot: Secondary | ICD-10-CM

## 2020-06-08 NOTE — Therapy (Signed)
[place], [place], [address] Phone: [phone]   Fax:  [phone]  Physical Therapy Treatment  Patient Details  Name: Mark Anthony MRN: [id number] Date of Birth: 12-Jan-1976 Referring Provider (PT): Mechele Claude PA-C   Encounter Date: 06/08/2020   PT End of Session - 06/08/20 0942    Visit Number 4    Number of Visits 12    Date for PT Re-Evaluation 07/11/20    Authorization Type [facility] (15 visits approved- auth required)    Authorization - Visit Number 4    Authorization - Number of Visits 15    Progress Note Due on Visit 10    PT Start Time 0921    PT Stop Time 1005    PT Time Calculation (min) 44 min    Activity Tolerance Patient tolerated treatment well    Behavior During Therapy [facility] for tasks assessed/performed           Past Medical History:  Diagnosis Date  . Hypertension    no meds at this time.  . Reflux   . TIA (transient ischemic attack)    no meds following    Past Surgical History:  Procedure Laterality Date  . ACHILLES TENDON SURGERY Right 02/02/2020   Procedure: Right Achilles tendon debridement and reconstruction;  Surgeon: Wylene Simmer, MD;  Location: [facility];  Service: Orthopedics;  Laterality: Right;  . ELBOW FRACTURE SURGERY      There were no vitals filed for this visit.   Subjective Assessment - 06/08/20 0940    Subjective Pt stated he has intermittent sharp pain, pain scale 4/10    Patient Stated Goals Get back to walking normal    Currently in Pain? Yes    Pain Score 4     Pain Location Ankle    Pain Orientation Right    Pain Descriptors / Indicators Sharp    Pain Type Surgical pain    Pain Onset More than a month ago    Pain Frequency Intermittent    Aggravating Factors  weight bearing    Pain Relieving Factors sitting, aleve                             [facility] Adult PT Treatment/Exercise - 06/08/20 0001      Ambulation/Gait    Ambulation/Gait Yes    Ambulation/Gait Assistance 7: Independent    Ambulation Distance (Feet) 226 Feet    Assistive device None    Gait Pattern Step-through pattern;Right foot flat;Antalgic;Wide base of support    Ambulation Surface Level;Indoor    Gait Comments Cueing to reduce hip abd, improve heel to toe mechanics and equalize stride lenth      Exercises   Exercises Ankle      Manual Therapy   Manual Therapy Soft tissue mobilization    Manual therapy comments Manual complete separate than rest of tx    Soft tissue mobilization prone: gastroc/soleus complex      Ankle Exercises: Stretches   Slant Board Stretch 3 reps;30 seconds    Other Stretch knee drive on 62HU step 5x 10"      Ankle Exercises: Standing   Rocker Board 2 minutes    Rocker Board Limitations DF/PF; lateral      Ankle Exercises: Supine   T-Band Df, Pf, Inv, Ev RTB 10x  PT Short Term Goals - 06/01/20 1524      PT SHORT TERM GOAL #1   Title Patient will be independent with HEP in order to improve functional outcomes.    Time 3    Period Weeks    Status On-going    Target Date 06/20/20      PT SHORT TERM GOAL #2   Title Patient will report at least 25% improvement in symptoms for improved quality of life.    Time 3    Period Weeks    Status On-going    Target Date 06/20/20             PT Averitt Term Goals - 06/01/20 1528      PT Frisinger TERM GOAL #1   Title Patient will report at least 75% improvement in symptoms for improved quality of life.    Time 6    Period Weeks    Status On-going      PT Loureiro TERM GOAL #2   Title Patient will improve FOTO score by at least 10 points in order to indicate improved tolerance to activity.    Time 6    Period Weeks    Status On-going      PT Drenning TERM GOAL #3   Title Patient will be able to navigate stairs with reciprocal pattern without compensation in order to demonstrate improved LE strength.    Time 6    Period Weeks     Status On-going      PT Mersman TERM GOAL #4   Title Patient will be able to ambulate for at least 34minutes with pain no greater than 1/10 in order to demonstrate improved ability to ambulate in the community.    Time 6    Period Weeks    Status On-going                 Plan - 06/08/20 1118    Clinical Impression Statement Pt arrived with antalgic gait mechanics, wide BOS and tendency to abduct hip rather than appropriate ankle mechanics.  Cueing to improve mechanics with some difficulty due to ankle mobility and pain.  Added knee drives to improve dorsiflexion as well as DF/PF wiht rockerbaord to improve ankle mobility.  Pt able to demonstrate appropriate mechanics with theraband with good ankle control control.  Added gastroc stretches to HEP, pt able to demonstrate appropriate form following cueing to reduce ER during stretch.  EOS with manual STM to address tightness in gastroc/soleus complex for pain control.  Noted musculature atrophy visual and palpation, plan to address gastroc strengthening in standing next session.    Personal Factors and Comorbidities Comorbidity 3+;Time since onset of injury/illness/exacerbation;Past/Current Experience;Profession    Comorbidities obesity, hx TIA, HTN    Examination-Activity Limitations Lift;Stand;Stairs;Squat;Locomotion Level;Transfers    Examination-Participation Restrictions Lawyer;Shop;Occupation;Meal Prep    Stability/Clinical Decision Making Stable/Uncomplicated    Clinical Decision Making Low    Rehab Potential Fair    PT Frequency 2x / week    PT Duration 6 weeks    PT Treatment/Interventions Aquatic Therapy;Biofeedback;Canalith Repostioning;Cryotherapy;Electrical Stimulation;Moist Heat;Traction;Ultrasound;Parrafin;Fluidtherapy;Contrast Bath;DME Instruction;Gait training;Stair training;Functional mobility training;Therapeutic activities;Therapeutic exercise;Balance training;Orthotic Fit/Training;Patient/family  education;Neuromuscular re-education;Manual lymph drainage;Manual techniques;Compression bandaging;Scar mobilization;Passive range of motion;Dry needling;Energy conservation;Splinting;Taping;Vasopneumatic Device;Vestibular;Spinal Manipulations;Joint Manipulations;ADLs/Self Care Home Management;Iontophoresis 4mg /ml Dexamethasone    PT Next Visit Plan Begin heel raises next session.  Progress ankle ROM/mobility and strengthing as able. possibly begin manual for mobility/pain and scar mobility, begin with stretches, AROM and resistance with band  PT Home Exercise Plan 05/30/20 calf stretch; 06/05/20: RTB with printout for 4 directions.; 06/08/20: standing gastroc stretch.           Patient will benefit from skilled therapeutic intervention in order to improve the following deficits and impairments:  Abnormal gait, Decreased activity tolerance, Decreased balance, Decreased mobility, Decreased range of motion, Decreased scar mobility, Decreased endurance, Decreased knowledge of precautions, Increased edema, Hypomobility, Difficulty walking, Pain, Increased muscle spasms, Improper body mechanics, Impaired flexibility  Visit Diagnosis: Muscle weakness (generalized)  Other symptoms and signs involving the musculoskeletal system  Other abnormalities of gait and mobility  Pain in right ankle and joints of right foot     Problem List There are no problems to display for this patient.  Ihor Austin, LPTA/CLT; CBIS [phone]  Aldona Lento 06/08/2020, 11:24 AM  [place] [address] [place], [place], [address] Phone: [phone]   Fax:  [phone]  Name: Marquett Bertoli MRN: [id number] Date of Birth: May 20, 1976

## 2020-06-08 NOTE — Patient Instructions (Signed)
Gastroc, Standing    Stand, right foot behind, heel on floor and turned slightly out, leg straight, forward leg bent. Keeping arms straight, push pelvis forward until stretch is felt in calf. Hold 30 seconds. Repeat 3 times per session. Do 2 sessions per day.  Copyright  VHI. All rights reserved.

## 2020-06-13 ENCOUNTER — Other Ambulatory Visit: Payer: Self-pay

## 2020-06-13 ENCOUNTER — Encounter (HOSPITAL_COMMUNITY): Payer: Self-pay

## 2020-06-13 ENCOUNTER — Ambulatory Visit (HOSPITAL_COMMUNITY): Payer: No Typology Code available for payment source

## 2020-06-13 DIAGNOSIS — R29898 Other symptoms and signs involving the musculoskeletal system: Secondary | ICD-10-CM

## 2020-06-13 DIAGNOSIS — M6281 Muscle weakness (generalized): Secondary | ICD-10-CM

## 2020-06-13 DIAGNOSIS — M25571 Pain in right ankle and joints of right foot: Secondary | ICD-10-CM | POA: Diagnosis not present

## 2020-06-13 DIAGNOSIS — R2689 Other abnormalities of gait and mobility: Secondary | ICD-10-CM

## 2020-06-13 NOTE — Patient Instructions (Signed)
Toe / Heel Raise (Standing)    Standing with support raise heels and hold for 3". Standing tall lift up toes. Repeat 10 times.  Copyright  VHI. All rights reserved.   Plantar Fascia, Standing    Stand on stairs or curb. Lower heel. Hold 30 seconds.  Repeat 3 times per session. Do 1-2 sessions per day.  Copyright  VHI. All rights reserved.

## 2020-06-13 NOTE — Therapy (Signed)
[place], [place], [address] Phone: [phone]   Fax:  [phone]  Physical Therapy Treatment  Patient Details  Name: Mark Anthony MRN: [id number] Date of Birth: July 10, 1976 Referring Provider (PT): Mechele Claude PA-C   Encounter Date: 06/13/2020   PT End of Session - 06/13/20 1537    Visit Number 5    Number of Visits 12    Date for PT Re-Evaluation 07/11/20    Authorization Type [facility] (15 visits approved- auth required)    Authorization - Visit Number 5    Authorization - Number of Visits 15    Progress Note Due on Visit 10    PT Start Time 7353    PT Stop Time 1615    PT Time Calculation (min) 42 min    Activity Tolerance Patient tolerated treatment well    Behavior During Therapy [facility] for tasks assessed/performed           Past Medical History:  Diagnosis Date   Hypertension    no meds at this time.   Reflux    TIA (transient ischemic attack)    no meds following    Past Surgical History:  Procedure Laterality Date   ACHILLES TENDON SURGERY Right 02/02/2020   Procedure: Right Achilles tendon debridement and reconstruction;  Surgeon: Wylene Simmer, MD;  Location: [place];  Service: Orthopedics;  Laterality: Right;   ELBOW FRACTURE SURGERY      There were no vitals filed for this visit.   Subjective Assessment - 06/13/20 1537    Subjective Pt stated he can tell improvements, reports ankle and heel are getting a lot looser.    Patient Stated Goals Get back to walking normal    Currently in Pain? No/denies                             [facility] Adult PT Treatment/Exercise - 06/13/20 0001      Ambulation/Gait   Ambulation/Gait Yes    Ambulation/Gait Assistance 7: Independent    Ambulation Distance (Feet) 226 Feet    Assistive device None    Gait Pattern Step-through pattern;Right foot flat;Antalgic;Wide base of support    Ambulation Surface Level;Indoor     Gait Comments Cueing to reduce heel to toe mechanics      Exercises   Exercises Ankle      Manual Therapy   Manual Therapy Soft tissue mobilization    Manual therapy comments Manual complete separate than rest of tx    Soft tissue mobilization prone: gastroc/soleus complex      Ankle Exercises: Stretches   Plantar Fascia Stretch 3 reps;30 seconds    Plantar Fascia Stretch Limitations on step    Slant Board Stretch 3 reps;30 seconds    Other Stretch knee drive on 29JM step 5x 10"      Ankle Exercises: Standing   Rocker Board 2 minutes    Rocker Board Limitations DF/PF; lateral    Heel Raises 10 reps;Both;3 seconds;5 seconds    Heel Raises Limitations partial lift due to weakness    Toe Raise 10 reps;3 seconds    Toe Raise Limitations incline slope                    PT Short Term Goals - 06/01/20 1524      PT SHORT TERM GOAL #1   Title Patient will be independent with HEP in order  to improve functional outcomes.    Time 3    Period Weeks    Status On-going    Target Date 06/20/20      PT SHORT TERM GOAL #2   Title Patient will report at least 25% improvement in symptoms for improved quality of life.    Time 3    Period Weeks    Status On-going    Target Date 06/20/20             PT Oscar Term Goals - 06/01/20 1528      PT Scribner TERM GOAL #1   Title Patient will report at least 75% improvement in symptoms for improved quality of life.    Time 6    Period Weeks    Status On-going      PT Maione TERM GOAL #2   Title Patient will improve FOTO score by at least 10 points in order to indicate improved tolerance to activity.    Time 6    Period Weeks    Status On-going      PT Mcclafferty TERM GOAL #3   Title Patient will be able to navigate stairs with reciprocal pattern without compensation in order to demonstrate improved LE strength.    Time 6    Period Weeks    Status On-going      PT Holderness TERM GOAL #4   Title Patient will be able to ambulate for at  least 32minutes with pain no greater than 1/10 in order to demonstrate improved ability to ambulate in the community.    Time 6    Period Weeks    Status On-going                 Plan - 06/13/20 1618    Clinical Impression Statement Added standing heel and toe raises for strenghtening.  Pt presents with weak gastroc noted with visible fatigue and inability to acheive full range.  Added heel/toe raises to HEP for strengthening.  Pt presents with improve stance phase during gait, cueing required for heel to toe mechanics and appropriate arm swing.    Personal Factors and Comorbidities Comorbidity 3+;Time since onset of injury/illness/exacerbation;Past/Current Experience;Profession    Comorbidities obesity, hx TIA, HTN    Examination-Activity Limitations Lift;Stand;Stairs;Squat;Locomotion Level;Transfers    Examination-Participation Restrictions Lawyer;Shop;Occupation;Meal Prep    Stability/Clinical Decision Making Stable/Uncomplicated    Clinical Decision Making Low    Rehab Potential Fair    PT Frequency 2x / week    PT Duration 6 weeks    PT Treatment/Interventions Aquatic Therapy;Biofeedback;Canalith Repostioning;Cryotherapy;Electrical Stimulation;Moist Heat;Traction;Ultrasound;Parrafin;Fluidtherapy;Contrast Bath;DME Instruction;Gait training;Stair training;Functional mobility training;Therapeutic activities;Therapeutic exercise;Balance training;Orthotic Fit/Training;Patient/family education;Neuromuscular re-education;Manual lymph drainage;Manual techniques;Compression bandaging;Scar mobilization;Passive range of motion;Dry needling;Energy conservation;Splinting;Taping;Vasopneumatic Device;Vestibular;Spinal Manipulations;Joint Manipulations;ADLs/Self Care Home Management;Iontophoresis 4mg /ml Dexamethasone    PT Next Visit Plan Continue gastroc strengthening and appropriate gait training.  Progressed ankle ROM/mobility and strengthening as able.  Manual for mobility/pain and  scar mobility.    PT Home Exercise Plan 05/30/20 calf stretch; 06/05/20: RTB with printout for 4 directions.; 06/08/20: standing gastroc stretch. 06/13/20: heel/toe raises and plantar fascia stretch           Patient will benefit from skilled therapeutic intervention in order to improve the following deficits and impairments:  Abnormal gait, Decreased activity tolerance, Decreased balance, Decreased mobility, Decreased range of motion, Decreased scar mobility, Decreased endurance, Decreased knowledge of precautions, Increased edema, Hypomobility, Difficulty walking, Pain, Increased muscle spasms, Improper body mechanics, Impaired flexibility  Visit Diagnosis: Muscle weakness (generalized)  Other symptoms and signs involving the musculoskeletal system  Other abnormalities of gait and mobility  Pain in right ankle and joints of right foot     Problem List There are no problems to display for this patient.  Mark Anthony, LPTA/CLT; CBIS [phone]  Mark Anthony 06/13/2020, 4:40 PM  [place], [place], [address] Phone: [phone]   Fax:  [phone]  Name: Mark Anthony MRN: [id number] Date of Birth: 01-11-1976

## 2020-06-15 ENCOUNTER — Ambulatory Visit (HOSPITAL_COMMUNITY): Payer: No Typology Code available for payment source | Admitting: Physical Therapy

## 2020-06-15 ENCOUNTER — Other Ambulatory Visit: Payer: Self-pay

## 2020-06-15 DIAGNOSIS — M6281 Muscle weakness (generalized): Secondary | ICD-10-CM

## 2020-06-15 DIAGNOSIS — M25571 Pain in right ankle and joints of right foot: Secondary | ICD-10-CM | POA: Diagnosis not present

## 2020-06-15 DIAGNOSIS — R29898 Other symptoms and signs involving the musculoskeletal system: Secondary | ICD-10-CM

## 2020-06-15 DIAGNOSIS — R2689 Other abnormalities of gait and mobility: Secondary | ICD-10-CM

## 2020-06-15 NOTE — Therapy (Signed)
[place], [place], [address] Phone: [phone]   Fax:  [phone]  Physical Therapy Treatment  Patient Details  Name: Mark Anthony MRN: [id number] Date of Birth: 1975-12-04 Referring Provider (PT): Mechele Claude PA-C   Encounter Date: 06/15/2020   PT End of Session - 06/15/20 0956    Visit Number 6    Number of Visits 12    Date for PT Re-Evaluation 07/11/20    Authorization Type [facility] (15 visits approved- auth required)    Authorization - Visit Number 6    Authorization - Number of Visits 15    Progress Note Due on Visit 10    PT Start Time 0915    PT Stop Time 0954    PT Time Calculation (min) 39 min    Activity Tolerance Patient tolerated treatment well    Behavior During Therapy [facility] for tasks assessed/performed           Past Medical History:  Diagnosis Date  . Hypertension    no meds at this time.  . Reflux   . TIA (transient ischemic attack)    no meds following    Past Surgical History:  Procedure Laterality Date  . ACHILLES TENDON SURGERY Right 02/02/2020   Procedure: Right Achilles tendon debridement and reconstruction;  Surgeon: Wylene Simmer, MD;  Location: [place];  Service: Orthopedics;  Laterality: Right;  . ELBOW FRACTURE SURGERY      There were no vitals filed for this visit.   Subjective Assessment - 06/15/20 0926    Subjective PTstates that he had sharp jabbing throughout the night where he could not sleep    Limitations Lifting;Standing;Walking;House hold activities    How Alesi can you stand comfortably? 5-10 minutes    How Ostrosky can you walk comfortably? 5 minutes    Patient Stated Goals Get back to walking normal    Currently in Pain? Yes    Pain Score 2     Pain Location Heel    Pain Orientation Right    Pain Descriptors / Indicators Jabbing    Pain Type Acute pain    Pain Onset More than a month ago    Pain Frequency Intermittent    Aggravating Factors   wt bearing    Pain Relieving Factors rest    Effect of Pain on Daily Activities limits                             [facility] Adult PT Treatment/Exercise - 06/15/20 0001      Exercises   Exercises Ankle      Ankle Exercises: Stretches   Plantar Fascia Stretch 3 reps;30 seconds    Gastroc Stretch 3 reps;30 seconds    Slant Board Stretch 3 reps;30 seconds    Other Stretch total gym level 31 heelraises  with 3 breaths, heel strech 3 breaths x 10       Ankle Exercises: Standing   Vector Stance Right;Left;3 reps;10 seconds   2 finger hold when on RT LE    Rocker Board 2 minutes    Rocker Board Limitations DF/PF only    Toe Raise 15 reps      Ankle Exercises: Seated   BAPS Level 3;10 reps   10 12-6; 3-9; 5 clock rotation                    PT Short Term Goals -  06/01/20 1524      PT SHORT TERM GOAL #1   Title Patient will be independent with HEP in order to improve functional outcomes.    Time 3    Period Weeks    Status On-going    Target Date 06/20/20      PT SHORT TERM GOAL #2   Title Patient will report at least 25% improvement in symptoms for improved quality of life.    Time 3    Period Weeks    Status On-going    Target Date 06/20/20             PT Cr[place]cher Term Goals - 06/01/20 1528      PT Beckner TERM GOAL #1   Title Patient will report at least 75% improvement in symptoms for improved quality of life.    Time 6    Period Weeks    Status On-going      PT Poppe TERM GOAL #2   Title Patient will improve FOTO score by at least 10 points in order to indicate improved tolerance to activity.    Time 6    Period Weeks    Status On-going      PT Arneson TERM GOAL #3   Title Patient will be able to navigate stairs with reciprocal pattern without compensation in order to demonstrate improved LE strength.    Time 6    Period Weeks    Status On-going      PT Stur[place] TERM GOAL #4   Title Patient will be able to ambulate for at least 87minutes with  pain no greater than 1/10 in order to demonstrate improved ability to ambulate in the community.    Time 6    Period Weeks    Status On-going                 Plan - 06/15/20 1000    Clinical Impression Statement Progressed pt to vector stance as well as sitting Baps exercises.  Standing heel raises proved to difficult for pt standing therefore therapist changed this exercise to the total gym where there would be less stress.  Gave pt green tband for home use with tband exercises.    Personal Factors and Comorbidities Comorbidity 3+;Time since onset of injury/illness/exacerbation;Past/Current Experience;Profession    Comorbidities obesity, hx TIA, HTN    Examination-Activity Limitations Lift;Stand;Stairs;Squat;Locomotion Level;Transfers    Examination-Participation Restrictions Lawyer;Shop;Occupation;Meal Prep    Stability/Clinical Decision Making Stable/Uncomplicated    Rehab Potential Fair    PT Frequency 2x / week    PT Duration 6 weeks    PT Treatment/Interventions Aquatic Therapy;Biofeedback;Canalith Repostioning;Cryotherapy;Electrical Stimulation;Moist Heat;Traction;Ultrasound;Parrafin;Fluidtherapy;Contrast Bath;DME Instruction;Gait training;Stair training;Functional mobility training;Therapeutic activities;Therapeutic exercise;Balance training;Orthotic Fit/Training;Patient/family education;Neuromuscular re-education;Manual lymph drainage;Manual techniques;Compression bandaging;Scar mobilization;Passive range of motion;Dry needling;Energy conservation;Splinting;Taping;Vasopneumatic Device;Vestibular;Spinal Manipulations;Joint Manipulations;ADLs/Self Care Home Management;Iontophoresis 4mg /ml Dexamethasone    PT Next Visit Plan Continue gastroc strengthening and appropriate gait training.  Progressed ankle ROM/mobility and strengthening as able.  Manual for mobility/pain and scar mobility.    PT Home Exercise Plan 05/30/20 calf stretch; 06/05/20: RTB with printout for 4  directions.; 06/08/20: standing gastroc stretch. 06/13/20: heel/toe raises and plantar fascia stretch           Patient will benefit from skilled therapeutic intervention in order to improve the following deficits and impairments:  Abnormal gait, Decreased activity tolerance, Decreased balance, Decreased mobility, Decreased range of motion, Decreased scar mobility, Decreased endurance, Decreased knowledge of precautions, Increased edema, Hypomobility, Difficulty walking, Pain, Increased muscle spasms,  Improper body mechanics, Impaired flexibility  Visit Diagnosis: Muscle weakness (generalized)  Other symptoms and signs involving the musculoskeletal system  Other abnormalities of gait and mobility  Pain in right ankle and joints of right foot     Problem List There are no problems to display for this patient.   Rayetta Humphrey, PT CLT [phone] 06/15/2020, 10:03 AM  [place], [place], [address] Phone: [phone]   Fax:  [phone]  Name: Mark Anthony MRN: [id number] Date of Birth: 08-Jun-1976

## 2020-06-19 ENCOUNTER — Other Ambulatory Visit: Payer: Self-pay

## 2020-06-19 ENCOUNTER — Encounter (HOSPITAL_COMMUNITY): Payer: Self-pay | Admitting: Physical Therapy

## 2020-06-19 ENCOUNTER — Ambulatory Visit (HOSPITAL_COMMUNITY): Payer: No Typology Code available for payment source | Admitting: Physical Therapy

## 2020-06-19 DIAGNOSIS — M25571 Pain in right ankle and joints of right foot: Secondary | ICD-10-CM

## 2020-06-19 DIAGNOSIS — R2689 Other abnormalities of gait and mobility: Secondary | ICD-10-CM

## 2020-06-19 DIAGNOSIS — M6281 Muscle weakness (generalized): Secondary | ICD-10-CM

## 2020-06-19 DIAGNOSIS — R29898 Other symptoms and signs involving the musculoskeletal system: Secondary | ICD-10-CM

## 2020-06-19 NOTE — Therapy (Signed)
[name], [place], [address] Phone: [phone]   Fax:  [phone]  Physical Therapy Treatment  Patient Details  Name: Mark Anthony MRN: [id number] Date of Birth: [birth year] Referring Provider (PT): Mechele Claude PA-C   Encounter Date: 06/19/2020   PT End of Session - 06/19/20 1039    Visit Number 7    Number of Visits 12    Date for PT Re-Evaluation 07/11/20    Authorization Type [facility] (15 visits approved- auth required)    Authorization - Visit Number 7    Authorization - Number of Visits 15    Progress Note Due on Visit 10    PT Start Time 1039   delayed check in   PT Stop Time 1124    PT Time Calculation (min) 45 min    Activity Tolerance Patient tolerated treatment well    Behavior During Therapy [facility] for tasks assessed/performed           Past Medical History:  Diagnosis Date  . Hypertension    no meds at this time.  . Reflux   . TIA (transient ischemic attack)    no meds following    Past Surgical History:  Procedure Laterality Date  . ACHILLES TENDON SURGERY Right 02/02/2020   Procedure: Right Achilles tendon debridement and reconstruction;  Surgeon: Wylene Simmer, MD;  Location: [place];  Service: Orthopedics;  Laterality: Right;  . ELBOW FRACTURE SURGERY      There were no vitals filed for this visit.   Subjective Assessment - 06/19/20 1039    Subjective Patient states Friday after session he was alright. Friday night he was having a lot of pain and took 6 Alieve. Patient states that Saturday and Sunday it began to calm down. He tried doing some of the exercises and it did not help. It was sharp pain.    Limitations Lifting;Standing;Walking;House hold activities    How Aleman can you stand comfortably? 5-10 minutes    How Hoeschen can you walk comfortably? 5 minutes    Patient Stated Goals Get back to walking normal    Currently in Pain? Yes    Pain Score 3     Pain Location Heel    arch and toes   Pain Onset More than a month ago                             [facility] Adult PT Treatment/Exercise - 06/19/20 0001      Ankle Exercises: Stretches   Plantar Fascia Stretch 3 reps;30 seconds    Plantar Fascia Stretch Limitations on step    Gastroc Stretch 3 reps;30 seconds    Gastroc Stretch Limitations incline board    Slant Board Stretch 3 reps;30 seconds    Other Stretch knee drive on 18 inch step     Other Stretch total gym level 31 heelraises  3x10; squats 3x10 level 31      Ankle Exercises: Standing   Vector Stance 5 reps;5 seconds    Rocker Board 2 minutes    Rocker Board Limitations DF/PF and A/P    Toe Raise 15 reps    Toe Raise Limitations incline slope                  PT Education - 06/19/20 1039    Education Details Patient educated on HEP, mechanics of exercise    Person(s) Educated Patient  Methods Explanation;Demonstration    Comprehension Verbalized understanding;Returned demonstration            PT Short Term Goals - 06/01/20 1524      PT SHORT TERM GOAL #1   Title Patient will be independent with HEP in order to improve functional outcomes.    Time 3    Period Weeks    Status On-going    Target Date 06/20/20      PT SHORT TERM GOAL #2   Title Patient will report at least 25% improvement in symptoms for improved quality of life.    Time 3    Period Weeks    Status On-going    Target Date 06/20/20             PT Kantz Term Goals - 06/01/20 1528      PT Fiorini TERM GOAL #1   Title Patient will report at least 75% improvement in symptoms for improved quality of life.    Time 6    Period Weeks    Status On-going      PT Betsill TERM GOAL #2   Title Patient will improve FOTO score by at least 10 points in order to indicate improved tolerance to activity.    Time 6    Period Weeks    Status On-going      PT Lichter TERM GOAL #3   Title Patient will be able to navigate stairs with reciprocal pattern  without compensation in order to demonstrate improved LE strength.    Time 6    Period Weeks    Status On-going      PT C[place]ston TERM GOAL #4   Title Patient will be able to ambulate for at least 32minutes with pain no greater than 1/10 in order to demonstrate improved ability to ambulate in the community.    Time 6    Period Weeks    Status On-going                 Plan - 06/19/20 1040    Clinical Impression Statement Patient tolerates stretches well no increase in symptoms during or following. Patient able to complete heel raise to very limited height so continued heel raises on total gym which improves ROM but it is still restricted. He is ambulating with improving gait pattern but continues to be limited secondary to impaired DF ROM. He completes leg press on total gym with cueing for depth until heels start to rise off platform. He denies pain while completing. Patient requires unilateral UE support to complete SLS with vectors secondary to impaired balance and LE strength. Patient requires bilateral UE support with rockerboard exercises. Patient will continue to benefit from skilled physical therapy in order to reduce impairment and improve function.    Personal Factors and Comorbidities Comorbidity 3+;Time since onset of injury/illness/exacerbation;Past/Current Experience;Profession    Comorbidities obesity, hx TIA, HTN    Examination-Activity Limitations Lift;Stand;Stairs;Squat;Locomotion Level;Transfers    Examination-Participation Restrictions Lawyer;Shop;Occupation;Meal Prep    Stability/Clinical Decision Making Stable/Uncomplicated    Rehab Potential Fair    PT Frequency 2x / week    PT Duration 6 weeks    PT Treatment/Interventions Aquatic Therapy;Biofeedback;Canalith Repostioning;Cryotherapy;Electrical Stimulation;Moist Heat;Traction;Ultrasound;Parrafin;Fluidtherapy;Contrast Bath;DME Instruction;Gait training;Stair training;Functional mobility training;Therapeutic  activities;Therapeutic exercise;Balance training;Orthotic Fit/Training;Patient/family education;Neuromuscular re-education;Manual lymph drainage;Manual techniques;Compression bandaging;Scar mobilization;Passive range of motion;Dry needling;Energy conservation;Splinting;Taping;Vasopneumatic Device;Vestibular;Spinal Manipulations;Joint Manipulations;ADLs/Self Care Home Management;Iontophoresis 4mg /ml Dexamethasone    PT Next Visit Plan Continue gastroc strengthening and appropriate gait training.  Progressed ankle ROM/mobility and  strengthening as able.  Manual for mobility/pain and scar mobility.    PT Home Exercise Plan 05/30/20 calf stretch; 06/05/20: RTB with printout for 4 directions.; 06/08/20: standing gastroc stretch. 06/13/20: heel/toe raises and plantar fascia stretch           Patient will benefit from skilled therapeutic intervention in order to improve the following deficits and impairments:  Abnormal gait, Decreased activity tolerance, Decreased balance, Decreased mobility, Decreased range of motion, Decreased scar mobility, Decreased endurance, Decreased knowledge of precautions, Increased edema, Hypomobility, Difficulty walking, Pain, Increased muscle spasms, Improper body mechanics, Impaired flexibility  Visit Diagnosis: Pain in right ankle and joints of right foot  Other abnormalities of gait and mobility  Other symptoms and signs involving the musculoskeletal system  Muscle weakness (generalized)     Problem List There are no problems to display for this patient.   11:27 AM, 06/19/20 Mearl Latin PT, DPT Physical Therapist at [place], [place], [address] Phone: [phone]   Fax:  [phone]  Name: Camerin Ladouceur MRN: [id number] Date of Birth: [birth year]

## 2020-06-21 ENCOUNTER — Ambulatory Visit (HOSPITAL_COMMUNITY): Payer: No Typology Code available for payment source

## 2020-06-21 ENCOUNTER — Other Ambulatory Visit: Payer: Self-pay

## 2020-06-21 ENCOUNTER — Encounter (HOSPITAL_COMMUNITY): Payer: Self-pay

## 2020-06-21 DIAGNOSIS — M25571 Pain in right ankle and joints of right foot: Secondary | ICD-10-CM

## 2020-06-21 DIAGNOSIS — R2689 Other abnormalities of gait and mobility: Secondary | ICD-10-CM

## 2020-06-21 DIAGNOSIS — M6281 Muscle weakness (generalized): Secondary | ICD-10-CM

## 2020-06-21 DIAGNOSIS — R29898 Other symptoms and signs involving the musculoskeletal system: Secondary | ICD-10-CM

## 2020-06-21 NOTE — Therapy (Signed)
[place], [place], [address] Phone: [phone]   Fax:  [phone]  Physical Therapy Treatment  Patient Details  Name: Mark Anthony MRN: [id number] Date of Birth: 1976/09/23 Referring Provider (PT): Mechele Claude PA-C   Encounter Date: 06/21/2020   PT End of Session - 06/21/20 1302    Visit Number 8    Number of Visits 12    Date for PT Re-Evaluation 07/11/20    Authorization Type [facility] (15 visits approved- auth required)    Authorization - Visit Number 8    Authorization - Number of Visits 15    Progress Note Due on Visit 10    PT Start Time 1302    PT Stop Time 1343    PT Time Calculation (min) 41 min    Activity Tolerance Patient tolerated treatment well    Behavior During Therapy [facility] for tasks assessed/performed           Past Medical History:  Diagnosis Date   Hypertension    no meds at this time.   Reflux    TIA (transient ischemic attack)    no meds following    Past Surgical History:  Procedure Laterality Date   ACHILLES TENDON SURGERY Right 02/02/2020   Procedure: Right Achilles tendon debridement and reconstruction;  Surgeon: Wylene Simmer, MD;  Location: [place];  Service: Orthopedics;  Laterality: Right;   ELBOW FRACTURE SURGERY      There were no vitals filed for this visit.   Subjective Assessment - 06/21/20 1303    Subjective Pt reports last session wasn't as sore after. Pt reports noticing improvement. Pt repoerts able to stand for ~15 minutes at grocery store before needs to sit due to pain.    Limitations Lifting;Standing;Walking;House hold activities    How Gervasi can you stand comfortably? 5-10 minutes    How Leiva can you walk comfortably? 5 minutes    Patient Stated Goals Get back to walking normal    Currently in Pain? Yes    Pain Score 3     Pain Location --   arch, plantar surface   Pain Orientation Right    Pain Descriptors / Indicators  Jabbing;Sore;Sharp    Pain Onset More than a month ago    Pain Frequency Intermittent                   [facility] Adult PT Treatment/Exercise - 06/21/20 0001      Ankle Exercises: Stretches   Plantar Fascia Stretch 3 reps;30 seconds    Plantar Fascia Stretch Limitations on step, bil    Gastroc Stretch 3 reps;30 seconds    Gastroc Stretch Limitations incline board, bil    Slant Board Stretch 3 reps;30 seconds    Other Stretch knee drive on 18 inch step     Other Stretch total gym level 32 heelraises  3x10; squats 3x10 level 32      Ankle Exercises: Standing   Vector Stance Right;5 reps;5 seconds    Heel Raises Both;5 reps    Heel Raises Limitations partial R lift due to weakness    Other Standing Ankle Exercises standing hip abduction and extension, bil 10 reps each      Ankle Exercises: Seated   Towel Crunch 3 reps   R, 20 seconds   Heel Raises 15 reps    Toe Raise 15 reps    Other Seated Ankle Exercises arch doming, R foot, x20 reps  PT Education - 06/21/20 1344    Education Details Exercise technique, updated HEP    Person(s) Educated Patient    Methods Explanation;Demonstration    Comprehension Verbalized understanding;Returned demonstration            PT Short Term Goals - 06/01/20 1524      PT SHORT TERM GOAL #1   Title Patient will be independent with HEP in order to improve functional outcomes.    Time 3    Period Weeks    Status On-going    Target Date 06/20/20      PT SHORT TERM GOAL #2   Title Patient will report at least 25% improvement in symptoms for improved quality of life.    Time 3    Period Weeks    Status On-going    Target Date 06/20/20             PT Yeske Term Goals - 06/01/20 1528      PT Orne TERM GOAL #1   Title Patient will report at least 75% improvement in symptoms for improved quality of life.    Time 6    Period Weeks    Status On-going      PT [place] TERM GOAL #2   Title Patient will improve  FOTO score by at least 10 points in order to indicate improved tolerance to activity.    Time 6    Period Weeks    Status On-going      PT S[place]ker TERM GOAL #3   Title Patient will be able to navigate stairs with reciprocal pattern without compensation in order to demonstrate improved LE strength.    Time 6    Period Weeks    Status On-going      PT Fitch TERM GOAL #4   Title Patient will be able to ambulate for at least 76minutes with pain no greater than 1/10 in order to demonstrate improved ability to ambulate in the community.    Time 6    Period Weeks    Status On-going                 Plan - 06/21/20 1344    Clinical Impression Statement Added intrinsic foot exercises to strengthen foot and improve plantar foot pain. Added standing hip abduction and extension for SLS strengthening and hip strengthening. Pt tolerates exercises with intermittent cues for form. Pt continues to fatigue with SLS and demonstrates R ankle weakness requiring single or bil UE support on // bars for support. Pt ambulates around clinic appearing to compensate with eversion/external rotation due to lack of dorsiflexion. Updated HEP with intrinsic foot exercises. Continue to progress as able.    Personal Factors and Comorbidities Comorbidity 3+;Time since onset of injury/illness/exacerbation;Past/Current Experience;Profession    Comorbidities obesity, hx TIA, HTN    Examination-Activity Limitations Lift;Stand;Stairs;Squat;Locomotion Level;Transfers    Examination-Participation Restrictions Lawyer;Shop;Occupation;Meal Prep    Stability/Clinical Decision Making Stable/Uncomplicated    Rehab Potential Fair    PT Frequency 2x / week    PT Duration 6 weeks    PT Treatment/Interventions Aquatic Therapy;Biofeedback;Canalith Repostioning;Cryotherapy;Electrical Stimulation;Moist Heat;Traction;Ultrasound;Parrafin;Fluidtherapy;Contrast Bath;DME Instruction;Gait training;Stair training;Functional mobility  training;Therapeutic activities;Therapeutic exercise;Balance training;Orthotic Fit/Training;Patient/family education;Neuromuscular re-education;Manual lymph drainage;Manual techniques;Compression bandaging;Scar mobilization;Passive range of motion;Dry needling;Energy conservation;Splinting;Taping;Vasopneumatic Device;Vestibular;Spinal Manipulations;Joint Manipulations;ADLs/Self Care Home Management;Iontophoresis 4mg /ml Dexamethasone    PT Next Visit Plan Continue gastroc strengthening and appropriate gait training.  Progressed ankle ROM/mobility and strengthening as able.  Manual for mobility/pain and scar mobility.    PT Home  Exercise Plan 05/30/20 calf stretch; 06/05/20: RTB with printout for 4 directions.; 06/08/20: standing gastroc stretch. 06/13/20: heel/toe raises and plantar fascia stretch; 8/26: seated heel raises, seated toe raises, arch doming, towel crunches    Consulted and Agree with Plan of Care Patient           Patient will benefit from skilled therapeutic intervention in order to improve the following deficits and impairments:  Abnormal gait, Decreased activity tolerance, Decreased balance, Decreased mobility, Decreased range of motion, Decreased scar mobility, Decreased endurance, Decreased knowledge of precautions, Increased edema, Hypomobility, Difficulty walking, Pain, Increased muscle spasms, Improper body mechanics, Impaired flexibility  Visit Diagnosis: Pain in right ankle and joints of right foot  Other abnormalities of gait and mobility  Other symptoms and signs involving the musculoskeletal system  Muscle weakness (generalized)     Problem List There are no problems to display for this patient.    Talbot Grumbling PT, DPT 06/21/20, 1:48 PM [place] [address] [place], [place], [address] Phone: [phone]   Fax:  [phone]  Name: Mark Anthony MRN: [id number] Date of Birth: 29-May-1976

## 2020-06-26 ENCOUNTER — Other Ambulatory Visit: Payer: Self-pay

## 2020-06-26 ENCOUNTER — Ambulatory Visit (HOSPITAL_COMMUNITY): Payer: No Typology Code available for payment source | Admitting: Physical Therapy

## 2020-06-26 ENCOUNTER — Encounter (HOSPITAL_COMMUNITY): Payer: Self-pay | Admitting: Physical Therapy

## 2020-06-26 DIAGNOSIS — M6281 Muscle weakness (generalized): Secondary | ICD-10-CM

## 2020-06-26 DIAGNOSIS — M25571 Pain in right ankle and joints of right foot: Secondary | ICD-10-CM

## 2020-06-26 DIAGNOSIS — R29898 Other symptoms and signs involving the musculoskeletal system: Secondary | ICD-10-CM

## 2020-06-26 DIAGNOSIS — R2689 Other abnormalities of gait and mobility: Secondary | ICD-10-CM

## 2020-06-26 NOTE — Therapy (Signed)
[place], [place], [address] Phone: [phone]   Fax:  [phone]  Physical Therapy Treatment/Progress Note  Patient Details  Name: Mark Anthony MRN: [id number] Date of Birth: 01-24-1976 Referring Provider (PT): Mechele Claude PA-C   Encounter Date: 06/26/2020   Progress Note   Reporting Period 05/30/20 to 06/26/20   See note below for Objective Data and Assessment of Progress/Goals    PT End of Session - 06/26/20 1035    Visit Number 9    Number of Visits 12    Date for PT Re-Evaluation 07/11/20    Authorization Type [facility] (15 visits approved- auth required)    Authorization - Visit Number 9    Authorization - Number of Visits 15    Progress Note Due on Visit 19    PT Start Time 1035    PT Stop Time 1115    PT Time Calculation (min) 40 min    Activity Tolerance Patient tolerated treatment well    Behavior During Therapy [facility] for tasks assessed/performed           Past Medical History:  Diagnosis Date  . Hypertension    no meds at this time.  . Reflux   . TIA (transient ischemic attack)    no meds following    Past Surgical History:  Procedure Laterality Date  . ACHILLES TENDON SURGERY Right 02/02/2020   Procedure: Right Achilles tendon debridement and reconstruction;  Surgeon: Wylene Simmer, MD;  Location: [place];  Service: Orthopedics;  Laterality: Right;  . ELBOW FRACTURE SURGERY      There were no vitals filed for this visit.   Subjective Assessment - 06/26/20 1034    Subjective Patient states he has been doing his HEP. He tried walking in tandem and his calf was on fire. Patient states about 50% improvement with physical therapy intervention. He is still having problems standing, balance, walking. He is able to walk for about 10 minutes currently.    Limitations Lifting;Standing;Walking;House hold activities    How Mark Anthony can you stand comfortably? 5-10 minutes    How Mark Anthony can you  walk comfortably? 10 minutes    Patient Stated Goals Get back to walking normal    Currently in Pain? Yes    Pain Score 4     Pain Location Ankle    Pain Orientation Right    Pain Onset More than a month ago              Mark Anthony PT Assessment - 06/26/20 0001      Assessment   Medical Diagnosis Rupture of R achilles tendon    Referring Provider (PT) Mechele Claude PA-C    Onset Date/Surgical Date 02/02/20    Next MD Visit Sept 20    Prior Therapy no      Precautions   Precautions None      Restrictions   Weight Bearing Restrictions No      Balance Screen   Has the patient fallen in the past 6 months No    Has the patient had a decrease in activity level because of a fear of falling?  No    Is the patient reluctant to leave their home because of a fear of falling?  No      Prior Function   Level of Independence Independent    Vocation Full time employment    [employer], walking      Cognition  Overall Cognitive Status Within Functional Limits for tasks assessed      Observation/Other Assessments   Observations Ambulates with antalgic gait on RLE with restricted ankle movements without AD, slight edema at anterior and lateral joint line, decreased medial arch bilateral in seated    Focus on Therapeutic Outcomes (FOTO)  69% limited      AROM   Right Ankle Dorsiflexion 2    Right Ankle Inversion 20    Right Ankle Eversion 13      Strength   Right Ankle Dorsiflexion 5/5    Right Ankle Inversion 4/5    Right Ankle Eversion 4/5                         [facility] Adult PT Treatment/Exercise - 06/26/20 0001      Manual Therapy   Manual Therapy Joint mobilization    Manual therapy comments Manual complete separate than rest of tx    Joint Mobilization Grade III AP of talocrual joint with dorsiflexion overpressure on RLE      Ankle Exercises: Stretches   Gastroc Stretch 3 reps;30 seconds    Gastroc Stretch Limitations incline board, bil     Slant Board Stretch 3 reps;30 seconds    Other Stretch knee drive on 18 inch step     Other Stretch total gym level 32 heelraises  3x10; squats 3x10 level 32                  PT Education - 06/26/20 1035    Education Details Patient educated on HEP, mechanics of exercise, progress made    Person(s) Educated Patient    Methods Explanation;Demonstration    Comprehension Verbalized understanding;Returned demonstration            PT Short Term Goals - 06/26/20 1042      PT SHORT TERM GOAL #1   Title Patient will be independent with HEP in order to improve functional outcomes.    Time 3    Period Weeks    Status Achieved    Target Date 06/20/20      PT SHORT TERM GOAL #2   Title Patient will report at least 25% improvement in symptoms for improved quality of life.    Time 3    Period Weeks    Status Achieved    Target Date 06/20/20             PT Neyra Term Goals - 06/26/20 1042      PT Mark Anthony TERM GOAL #1   Title Patient will report at least 75% improvement in symptoms for improved quality of life.    Time 6    Period Weeks    Status On-going      PT Mark Anthony TERM GOAL #2   Title Patient will improve FOTO score by at least 10 points in order to indicate improved tolerance to activity.    Time 6    Period Weeks    Status On-going      PT Mark Anthony TERM GOAL #3   Title Patient will be able to navigate stairs with reciprocal pattern without compensation in order to demonstrate improved LE strength.    Time 6    Period Weeks    Status On-going      PT Mark Anthony TERM GOAL #4   Title Patient will be able to ambulate for at least 40mnutes with pain no greater than 1/10 in order to demonstrate improved ability to ambulate  in the community.    Time 6    Period Weeks    Status On-going                 Plan - 06/26/20 1036    Clinical Impression Statement Patient has met 2/2 short term goals with ability to complete HEP and improvement in symptoms. Patient has met  0/4 Mark Anthony term goals at this time due to continued symptoms, impaired activity tolerance, difficulty with stairs, and decreased ambulation time. Patient showing improved strength, ROM, gait, activity tolerance, and functional mobility today. Patients R ankle dorsiflexion ROM improves from 2 degrees of DF to 7 following mobilizations. Patient showing improving ROM with total gym exercises today. Patient will continue to benefit from skilled physical therapy in order to reduce impairment and improve function.    Personal Factors and Comorbidities Comorbidity 3+;Time since onset of injury/illness/exacerbation;Past/Current Experience;Profession    Comorbidities obesity, hx TIA, HTN    Examination-Activity Limitations Lift;Stand;Stairs;Squat;Locomotion Level;Transfers    Examination-Participation Restrictions Lawyer;Shop;Occupation;Meal Prep    Stability/Clinical Decision Making Stable/Uncomplicated    Rehab Potential Fair    PT Frequency 2x / week    PT Duration 6 weeks    PT Treatment/Interventions Aquatic Therapy;Biofeedback;Canalith Repostioning;Cryotherapy;Electrical Stimulation;Moist Heat;Traction;Ultrasound;Parrafin;Fluidtherapy;Contrast Bath;DME Instruction;Gait training;Stair training;Functional mobility training;Therapeutic activities;Therapeutic exercise;Balance training;Orthotic Fit/Training;Patient/family education;Neuromuscular re-education;Manual lymph drainage;Manual techniques;Compression bandaging;Scar mobilization;Passive range of motion;Dry needling;Energy conservation;Splinting;Taping;Vasopneumatic Device;Vestibular;Spinal Manipulations;Joint Manipulations;ADLs/Self Care Home Management;Iontophoresis 20m/ml Dexamethasone    PT Next Visit Plan Continue gastroc strengthening and appropriate gait training.  Progressed ankle ROM/mobility and strengthening as able.  Manual for mobility/pain and scar mobility.    PT Home Exercise Plan 05/30/20 calf stretch; 06/05/20: RTB with printout  for 4 directions.; 06/08/20: standing gastroc stretch. 06/13/20: heel/toe raises and plantar fascia stretch; 8/26: seated heel raises, seated toe raises, arch doming, towel crunches    Consulted and Agree with Plan of Care Patient           Patient will benefit from skilled therapeutic intervention in order to improve the following deficits and impairments:  Abnormal gait, Decreased activity tolerance, Decreased balance, Decreased mobility, Decreased range of motion, Decreased scar mobility, Decreased endurance, Decreased knowledge of precautions, Increased edema, Hypomobility, Difficulty walking, Pain, Increased muscle spasms, Improper body mechanics, Impaired flexibility  Visit Diagnosis: Pain in right ankle and joints of right foot  Other abnormalities of gait and mobility  Other symptoms and signs involving the musculoskeletal system  Muscle weakness (generalized)     Problem List There are no problems to display for this patient.   11:18 AM, 06/26/20 AMearl LatinPT, DPT Physical [place] [address]Phone: [phone]  Fax:  [phone] Name: AKalib BhagatMRN: [id number] of Birth: [birth year]

## 2020-06-28 ENCOUNTER — Other Ambulatory Visit: Payer: Self-pay

## 2020-06-28 ENCOUNTER — Ambulatory Visit (HOSPITAL_COMMUNITY): Payer: No Typology Code available for payment source | Attending: Student

## 2020-06-28 ENCOUNTER — Encounter (HOSPITAL_COMMUNITY): Payer: Self-pay

## 2020-06-28 DIAGNOSIS — R29898 Other symptoms and signs involving the musculoskeletal system: Secondary | ICD-10-CM | POA: Diagnosis present

## 2020-06-28 DIAGNOSIS — M6281 Muscle weakness (generalized): Secondary | ICD-10-CM | POA: Diagnosis present

## 2020-06-28 DIAGNOSIS — M25571 Pain in right ankle and joints of right foot: Secondary | ICD-10-CM | POA: Diagnosis not present

## 2020-06-28 DIAGNOSIS — R2689 Other abnormalities of gait and mobility: Secondary | ICD-10-CM | POA: Insufficient documentation

## 2020-06-28 NOTE — Therapy (Signed)
[place], [place], [address] Phone: [phone]   Fax:  [phone]  Physical Therapy Treatment  Patient Details  Name: Mark Anthony MRN: [id number] Date of Birth: 1976/02/01 Referring Provider (PT): Mechele Claude PA-C   Encounter Date: 06/28/2020   PT End of Session - 06/28/20 1444    Visit Number 10    Number of Visits 12    Date for PT Re-Evaluation 07/11/20    Authorization Type [facility] (15 visits approved- auth required)    Authorization - Visit Number 10    Authorization - Number of Visits 15    Progress Note Due on Visit 19    PT Start Time 1355    PT Stop Time 1438    PT Time Calculation (min) 43 min    Activity Tolerance Patient tolerated treatment well    Behavior During Therapy [facility] for tasks assessed/performed           Past Medical History:  Diagnosis Date  . Hypertension    no meds at this time.  . Reflux   . TIA (transient ischemic attack)    no meds following    Past Surgical History:  Procedure Laterality Date  . ACHILLES TENDON SURGERY Right 02/02/2020   Procedure: Right Achilles tendon debridement and reconstruction;  Surgeon: Wylene Simmer, MD;  Location: [place];  Service: Orthopedics;  Laterality: Right;  . ELBOW FRACTURE SURGERY      There were no vitals filed for this visit.   Subjective Assessment - 06/28/20 1357    Subjective Pt stated he is doing good.  Has intermittent sharp pain with certain movements    Patient Stated Goals Get back to walking normal    Currently in Pain? Yes    Pain Score 4     Pain Location Ankle    Pain Orientation Right    Pain Descriptors / Indicators Sharp    Pain Type Acute pain    Pain Onset More than a month ago    Pain Frequency Intermittent    Aggravating Factors  weight bearing    Pain Relieving Factors rest    Effect of Pain on Daily Activities limits                             [facility] Adult PT  Treatment/Exercise - 06/28/20 0001      Ambulation/Gait   Ambulation/Gait Yes    Ambulation/Gait Assistance 7: Independent    Ambulation Distance (Feet) 226 Feet   2 sets   Assistive device None    Gait Pattern Step-through pattern;Right foot flat;Antalgic;Wide base of support    Ambulation Surface Level;Indoor    Gait velocity decreased    Gait Comments Cueing to reduce ER; heel to toe mechancs      Exercises   Exercises Ankle      Manual Therapy   Manual Therapy Soft tissue mobilization    Manual therapy comments Manual complete separate than rest of tx    Soft tissue mobilization Trigger point release to posterior tib with active inversion/eversion      Ankle Exercises: Stretches   Slant Board Stretch 3 reps;30 seconds    Other Stretch knee drive on 18 inch step     Other Stretch total gym level 32 heelraises  3x10; squats 3x10 level 32      Ankle Exercises: Standing   Heel Raises Both;5 reps  Heel Raises Limitations 4 sets; partial lift due to weakness; 1 HHA    Other Standing Ankle Exercises step down 4in step BUE 10x; step up 4in 1 UE 10x    Other Standing Ankle Exercises power tower DF stretch 10x 20"      Ankle Exercises: Seated   Other Seated Ankle Exercises Inversion/eversion with trigger point release to posterior tib                  PT Education - 06/28/20 1450    Education Details Pt educated on importance of reducing ER during gait to improve ankle mobility to assist with pain control.  Educated on benefits of ice massage to posterior tib for pain control.    Person(s) Educated Patient    Methods Explanation;Demonstration;Other (comment)   shown picture of posterior tib location   Comprehension Verbalized understanding            PT Short Term Goals - 06/26/20 1042      PT SHORT TERM GOAL #1   Title Patient will be independent with HEP in order to improve functional outcomes.    Time 3    Period Weeks    Status Achieved    Target Date  06/20/20      PT SHORT TERM GOAL #2   Title Patient will report at least 25% improvement in symptoms for improved quality of life.    Time 3    Period Weeks    Status Achieved    Target Date 06/20/20             PT [place] Term Goals - 06/26/20 1042      PT [place] TERM GOAL #1   Title Patient will report at least 75% improvement in symptoms for improved quality of life.    Time 6    Period Weeks    Status On-going      PT C[place] TERM GOAL #2   Title Patient will improve FOTO score by at least 10 points in order to indicate improved tolerance to activity.    Time 6    Period Weeks    Status On-going      PT [place] TERM GOAL #3   Title Patient will be able to navigate stairs with reciprocal pattern without compensation in order to demonstrate improved LE strength.    Time 6    Period Weeks    Status On-going      PT [place] TERM GOAL #4   Title Patient will be able to ambulate for at least 48minutes with pain no greater than 1/10 in order to demonstrate improved ability to ambulate in the community.    Time 6    Period Weeks    Status On-going                 Plan - 06/28/20 1445    Clinical Impression Statement Session focus on ankle mobility and progressed strengthening.  Added stretches for dorsiflexion on power tower as well as step down training for dorsiflexion with functional strengthening.  Pt c/o tightness/pain on medial aspect of ankle, upon palpation tenderness with posterior tib.  Educated on ice massage in this area to address any inflamation/pain in area.  Pt educated on importance of reducing ER during gait to address LBP and improve ankle mechanics.    Personal Factors and Comorbidities Comorbidity 3+;Time since onset of injury/illness/exacerbation;Past/Current Experience;Profession    Comorbidities obesity, hx TIA, HTN    Examination-Activity Limitations Lift;Stand;Stairs;Squat;Locomotion Level;Transfers  Examination-Participation Restrictions [employer];Shop;Occupation;Meal Prep    Stability/Clinical Decision Making Stable/Uncomplicated    Clinical Decision Making Low    Rehab Potential Fair    PT Frequency 2x / week    PT Duration 6 weeks    PT Treatment/Interventions Aquatic Therapy;Biofeedback;Canalith Repostioning;Cryotherapy;Electrical Stimulation;Moist Heat;Traction;Ultrasound;Parrafin;Fluidtherapy;Contrast Bath;DME Instruction;Gait training;Stair training;Functional mobility training;Therapeutic activities;Therapeutic exercise;Balance training;Orthotic Fit/Training;Patient/family education;Neuromuscular re-education;Manual lymph drainage;Manual techniques;Compression bandaging;Scar mobilization;Passive range of motion;Dry needling;Energy conservation;Splinting;Taping;Vasopneumatic Device;Vestibular;Spinal Manipulations;Joint Manipulations;ADLs/Self Care Home Management;Iontophoresis 4mg /ml Dexamethasone    PT Next Visit Plan Continue gastroc strengthening and appropriate gait training.  Progressed ankle ROM/mobility and strengthening as able.  Manual for mobility/pain and scar mobility.    PT Home Exercise Plan 05/30/20 calf stretch; 06/05/20: RTB with printout for 4 directions.; 06/08/20: standing gastroc stretch. 06/13/20: heel/toe raises and plantar fascia stretch; 8/26: seated heel raises, seated toe raises, arch doming, towel crunches           Patient will benefit from skilled therapeutic intervention in order to improve the following deficits and impairments:  Abnormal gait, Decreased activity tolerance, Decreased balance, Decreased mobility, Decreased range of motion, Decreased scar mobility, Decreased endurance, Decreased knowledge of precautions, Increased edema, Hypomobility, Difficulty walking, Pain, Increased muscle spasms, Improper body mechanics, Impaired flexibility  Visit Diagnosis: Pain in right ankle and joints of right foot  Other abnormalities of gait and mobility  Other symptoms and signs involving the  musculoskeletal system  Muscle weakness (generalized)     Problem List There are no problems to display for this patient.  Ihor Austin, LPTA/CLT; CBIS [phone]  Aldona Lento 06/28/2020, 2:52 PM  [place] [address] [place], [place], [address] Phone: [phone]   Fax:  [phone]  Name: Mark Anthony MRN: [id number] Date of Birth: [birth year]

## 2020-07-03 ENCOUNTER — Encounter (HOSPITAL_COMMUNITY): Payer: Self-pay

## 2020-07-03 ENCOUNTER — Ambulatory Visit (HOSPITAL_COMMUNITY): Payer: No Typology Code available for payment source

## 2020-07-03 ENCOUNTER — Other Ambulatory Visit: Payer: Self-pay

## 2020-07-03 DIAGNOSIS — R29898 Other symptoms and signs involving the musculoskeletal system: Secondary | ICD-10-CM

## 2020-07-03 DIAGNOSIS — M25571 Pain in right ankle and joints of right foot: Secondary | ICD-10-CM

## 2020-07-03 DIAGNOSIS — M6281 Muscle weakness (generalized): Secondary | ICD-10-CM

## 2020-07-03 DIAGNOSIS — R2689 Other abnormalities of gait and mobility: Secondary | ICD-10-CM

## 2020-07-03 NOTE — Therapy (Signed)
[place], [place], [address] Phone: [phone]   Fax:  [phone]  Physical Therapy Treatment  Patient Details  Name: Mark Anthony MRN: [id number] Date of Birth: Mar 17, 1976 Referring Provider (PT): Mechele Claude PA-C   Encounter Date: 07/03/2020   PT End of Session - 07/03/20 1327    Visit Number 11    Number of Visits 12    Date for PT Re-Evaluation 07/11/20    Authorization Type VA (15 visits approved- auth required)    Authorization - Visit Number 11    Authorization - Number of Visits 15    Progress Note Due on Visit 19    PT Start Time 1314    PT Stop Time 1355    PT Time Calculation (min) 41 min    Activity Tolerance Patient tolerated treatment well    Behavior During Therapy [facility] for tasks assessed/performed           Past Medical History:  Diagnosis Date  . Hypertension    no meds at this time.  . Reflux   . TIA (transient ischemic attack)    no meds following    Past Surgical History:  Procedure Laterality Date  . ACHILLES TENDON SURGERY Right 02/02/2020   Procedure: Right Achilles tendon debridement and reconstruction;  Surgeon: Wylene Simmer, MD;  Location: [place];  Service: Orthopedics;  Laterality: Right;  . ELBOW FRACTURE SURGERY      There were no vitals filed for this visit.   Subjective Assessment - 07/03/20 1319    Subjective Pt stated he has been working on his pretty walk, reports pain is reducing and feels he is making good progress towards goals.              [facility] PT Assessment - 07/03/20 0001      Assessment   Medical Diagnosis Rupture of R achilles tendon    Referring Provider (PT) Mechele Claude PA-C    Onset Date/Surgical Date 02/02/20    Next MD Visit Sept 20    Prior Therapy no      Precautions   Precautions None                         [facility] Adult PT Treatment/Exercise - 07/03/20 0001      Ankle Exercises: Standing   Heel  Raises Both    Heel Raises Limitations 4 sets: toes pointed in, neutral, incline slope    Other Standing Ankle Exercises step down 4in step BUE 10x; reciprocal pattern 4in x 1RT; posterior lunges 5x 2 sets.    Other Standing Ankle Exercises power tower DF stretch 10x 20"      Ankle Exercises: Stretches   Slant Board Stretch 3 reps;30 seconds    Other Stretch knee drive on 18 inch step     Other Stretch total gym level 32 heelraises  3x10; squats 3x10 level 32                    PT Short Term Goals - 06/26/20 1042      PT SHORT TERM GOAL #1   Title Patient will be independent with HEP in order to improve functional outcomes.    Time 3    Period Weeks    Status Achieved    Target Date 06/20/20      PT SHORT TERM GOAL #2   Title Patient will report at least 25%  improvement in symptoms for improved quality of life.    Time 3    Period Weeks    Status Achieved    Target Date 06/20/20             PT [place] Term Goals - 06/26/20 1042      PT [place] TERM GOAL #1   Title Patient will report at least 75% improvement in symptoms for improved quality of life.    Time 6    Period Weeks    Status On-going      PT [place] TERM GOAL #2   Title Patient will improve FOTO score by at least 10 points in order to indicate improved tolerance to activity.    Time 6    Period Weeks    Status On-going      PT [place] TERM GOAL #3   Title Patient will be able to navigate stairs with reciprocal pattern without compensation in order to demonstrate improved LE strength.    Time 6    Period Weeks    Status On-going      PT Schri[place] TERM GOAL #4   Title Patient will be able to ambulate for at least 5minutes with pain no greater than 1/10 in order to demonstrate improved ability to ambulate in the community.    Time 6    Period Weeks    Status On-going                 Plan - 07/03/20 1359    Clinical Impression Statement Continued session focus with ankle mobility and progressed  strength.  Added gait with IR to reducer ER during gait with reports of LBP reduced, encouraged to increase focus on neutral alignment during gait, verbalized understanding.  Began posterior lunges with some difficulty, continue to progress for mobilty and strengthening.    Personal Factors and Comorbidities Comorbidity 3+;Time since onset of injury/illness/exacerbation;Past/Current Experience;Profession    Comorbidities obesity, hx TIA, HTN    Examination-Activity Limitations Lift;Stand;Stairs;Squat;Locomotion Level;Transfers    Examination-Participation Restrictions Lawyer;Shop;Occupation;Meal Prep    Stability/Clinical Decision Making Stable/Uncomplicated    Clinical Decision Making Low    Rehab Potential Fair    PT Frequency 2x / week    PT Duration 6 weeks    PT Treatment/Interventions Aquatic Therapy;Biofeedback;Canalith Repostioning;Cryotherapy;Electrical Stimulation;Moist Heat;Traction;Ultrasound;Parrafin;Fluidtherapy;Contrast Bath;DME Instruction;Gait training;Stair training;Functional mobility training;Therapeutic activities;Therapeutic exercise;Balance training;Orthotic Fit/Training;Patient/family education;Neuromuscular re-education;Manual lymph drainage;Manual techniques;Compression bandaging;Scar mobilization;Passive range of motion;Dry needling;Energy conservation;Splinting;Taping;Vasopneumatic Device;Vestibular;Spinal Manipulations;Joint Manipulations;ADLs/Self Care Home Management;Iontophoresis 4mg /ml Dexamethasone    PT Next Visit Plan Recert/[facility] next session.  Continue gastroc strengthening and appropriate gait training.  Progressed ankle ROM/mobility and strengthening as able.  Manual for mobility/pain and scar mobility.    PT Home Exercise Plan 05/30/20 calf stretch; 06/05/20: RTB with printout for 4 directions.; 06/08/20: standing gastroc stretch. 06/13/20: heel/toe raises and plantar fascia stretch; 8/26: seated heel raises, seated toe raises, arch doming, towel crunches            Patient will benefit from skilled therapeutic intervention in order to improve the following deficits and impairments:  Abnormal gait, Decreased activity tolerance, Decreased balance, Decreased mobility, Decreased range of motion, Decreased scar mobility, Decreased endurance, Decreased knowledge of precautions, Increased edema, Hypomobility, Difficulty walking, Pain, Increased muscle spasms, Improper body mechanics, Impaired flexibility  Visit Diagnosis: Pain in right ankle and joints of right foot  Other abnormalities of gait and mobility  Other symptoms and signs involving the musculoskeletal system  Muscle weakness (generalized)     Problem List  There are no problems to display for this patient.  Ihor Austin, LPTA/CLT; CBIS [phone]  Aldona Lento 07/03/2020, 2:02 PM  [place] [address] [place], [place], [address] Phone: [phone]   Fax:  [phone]  Name: Mark Anthony MRN: [id number] Date of Birth: [birth year]

## 2020-07-05 ENCOUNTER — Encounter (HOSPITAL_COMMUNITY): Payer: Self-pay | Admitting: Physical Therapy

## 2020-07-05 ENCOUNTER — Other Ambulatory Visit: Payer: Self-pay

## 2020-07-05 ENCOUNTER — Ambulatory Visit (HOSPITAL_COMMUNITY): Payer: No Typology Code available for payment source | Admitting: Physical Therapy

## 2020-07-05 DIAGNOSIS — R2689 Other abnormalities of gait and mobility: Secondary | ICD-10-CM

## 2020-07-05 DIAGNOSIS — M25571 Pain in right ankle and joints of right foot: Secondary | ICD-10-CM | POA: Diagnosis not present

## 2020-07-05 DIAGNOSIS — M6281 Muscle weakness (generalized): Secondary | ICD-10-CM

## 2020-07-05 DIAGNOSIS — R29898 Other symptoms and signs involving the musculoskeletal system: Secondary | ICD-10-CM

## 2020-07-05 NOTE — Therapy (Signed)
[place], [place], [address] Phone: [phone]   Fax:  [phone]  Physical Therapy Treatment/Progress note/ Recert  Patient Details  Name: Mark Anthony MRN: [id number] Date of Birth: [birth year] Referring Provider (PT): Mechele Claude PA-C   Encounter Date: 07/05/2020   Progress Note   Reporting Period 06/26/20 to 07/05/20   See note below for Objective Data and Assessment of Progress/Goals    PT End of Session - 07/05/20 1257    Visit Number 12    Number of Visits 20    Date for PT Re-Evaluation 08/02/20    Authorization Type [facility] (requesting more visits)    Authorization - Visit Number 12    Authorization - Number of Visits 15    Progress Note Due on Visit 22    PT Start Time 1300    PT Stop Time 1340    PT Time Calculation (min) 40 min    Activity Tolerance Patient tolerated treatment well    Behavior During Therapy [facility] for tasks assessed/performed           Past Medical History:  Diagnosis Date  . Hypertension    no meds at this time.  . Reflux   . TIA (transient ischemic attack)    no meds following    Past Surgical History:  Procedure Laterality Date  . ACHILLES TENDON SURGERY Right 02/02/2020   Procedure: Right Achilles tendon debridement and reconstruction;  Surgeon: Wylene Simmer, MD;  Location: [place];  Service: Orthopedics;  Laterality: Right;  . ELBOW FRACTURE SURGERY      There were no vitals filed for this visit.   Subjective Assessment - 07/05/20 1258    Subjective Patient states 30% improvement since beginning physical therapy. His home exercises are going alright. He is feeling a lot better than he was before.    Limitations Lifting;Standing;Walking;House hold activities    How Council[place] can you walk comfortably? 10-15 minutes    Patient Stated Goals Get back to walking normal    Pain Score 3     Pain Location Ankle              [facility] PT Assessment - 07/05/20  0001      Assessment   Medical Diagnosis Rupture of R achilles tendon    Referring Provider (PT) Mechele Claude PA-C    Onset Date/Surgical Date 02/02/20    Next MD Visit Sept 20    Prior Therapy no      Precautions   Precautions None      Restrictions   Weight Bearing Restrictions No      Balance Screen   Has the patient fallen in the past 6 months No    Has the patient had a decrease in activity level because of a fear of falling?  No    Is the patient reluctant to leave their home because of a fear of falling?  No      Prior Function   Level of Independence Independent    Vocation Full time employment    Vocation Requirements Standing, walking      Cognition   Overall Cognitive Status Within Functional Limits for tasks assessed      Observation/Other Assessments   Observations Ambulates with antalgic gait on RLE with restricted ankle movements without AD, slight edema at anterior and lateral joint line, decreased medial arch bilateral in seated    Focus on Therapeutic Outcomes (FOTO)  69% limited  AROM   Right Ankle Dorsiflexion 2    Right Ankle Inversion 20    Right Ankle Eversion 13      Strength   Right Ankle Dorsiflexion 5/5    Right Ankle Inversion 4/5    Right Ankle Eversion 4/5                         [facility] Adult PT Treatment/Exercise - 07/05/20 0001      Ankle Exercises: Standing   Rocker Board 2 minutes    Rocker Board Limitations DF/PF and A/P    Heel Raises Both;10 reps;5 seconds    Heel Raises Limitations on incline slope    Other Standing Ankle Exercises step down 4in step BUE 10x; reciprocal pattern 4in x 5RT; posterior lunges 5x 2 sets bilateral     Other Standing Ankle Exercises power tower DF stretch 3x 20"      Ankle Exercises: Stretches   Soleus Stretch 30 seconds;3 reps   slant board   Slant Board Stretch 3 reps;30 seconds    Other Stretch knee drive on 18 inch step 10x 10 second holds    Other Stretch total gym level 32  heelraises  3x10; squats 3x10 level 32                  PT Education - 07/05/20 1257    Education Details Patient educated on HEP, mechanics of exercise, progress made, POC    Person(s) Educated Patient    Methods Explanation;Demonstration    Comprehension Verbalized understanding;Returned demonstration            PT Short Term Goals - 06/26/20 1042      PT SHORT TERM GOAL #1   Title Patient will be independent with HEP in order to improve functional outcomes.    Time 3    Period Weeks    Status Achieved    Target Date 06/20/20      PT SHORT TERM GOAL #2   Title Patient will report at least 25% improvement in symptoms for improved quality of life.    Time 3    Period Weeks    Status Achieved    Target Date 06/20/20             PT [place] Term Goals - 06/26/20 1042      PT [place] TERM GOAL #1   Title Patient will report at least 75% improvement in symptoms for improved quality of life.    Time 6    Period Weeks    Status On-going      PT Gerg[place] TERM GOAL #2   Title Patient will improve FOTO score by at least 10 points in order to indicate improved tolerance to activity.    Time 6    Period Weeks    Status On-going      PT [place] TERM GOAL #3   Title Patient will be able to navigate stairs with reciprocal pattern without compensation in order to demonstrate improved LE strength.    Time 6    Period Weeks    Status On-going      PT [place] TERM GOAL #4   Title Patient will be able to ambulate for at least 55mnutes with pain no greater than 1/10 in order to demonstrate improved ability to ambulate in the community.    Time 6    Period Weeks    Status On-going  Plan - 07/05/20 1258    Clinical Impression Statement Patient has met 2/2 short term goals with ability to complete HEP and improvement in symptoms. Patient has met 0/4 [place] term goals at this time due to continued symptoms, impaired activity tolerance, difficulty with stairs, and  decreased ambulation time. Patient showing improved strength, ROM, gait, activity tolerance, and functional mobility but continues to remain limited in all aspects. Extending POC to continue to focus on patient's limitations. Patient requires heavy bilateral UE support with forward step downs due to impaired DF ROM despite performing mobility exercises prior to completing. Patient improving balance and is able to perform several reps on rocker board without UE support and requires some verbal cueing for controlled movements. Patient will continue to benefit from skilled physical therapy in order to reduce impairment and improve function.    Personal Factors and Comorbidities Comorbidity 3+;Time since onset of injury/illness/exacerbation;Past/Current Experience;Profession    Comorbidities obesity, hx TIA, HTN    Examination-Activity Limitations Lift;Stand;Stairs;Squat;Locomotion Level;Transfers    Examination-Participation Restrictions Lawyer;Shop;Occupation;Meal Prep    Stability/Clinical Decision Making Stable/Uncomplicated    Rehab Potential Fair    PT Frequency 2x / week    PT Duration 4 weeks    PT Treatment/Interventions Aquatic Therapy;Biofeedback;Canalith Repostioning;Cryotherapy;Electrical Stimulation;Moist Heat;Traction;Ultrasound;Parrafin;Fluidtherapy;Contrast Bath;DME Instruction;Gait training;Stair training;Functional mobility training;Therapeutic activities;Therapeutic exercise;Balance training;Orthotic Fit/Training;Patient/family education;Neuromuscular re-education;Manual lymph drainage;Manual techniques;Compression bandaging;Scar mobilization;Passive range of motion;Dry needling;Energy conservation;Splinting;Taping;Vasopneumatic Device;Vestibular;Spinal Manipulations;Joint Manipulations;ADLs/Self Care Home Management;Iontophoresis 43m/ml Dexamethasone    PT Next Visit Plan Continue gastroc strengthening and appropriate gait training.  Progressed ankle ROM/mobility and  strengthening as able.  Manual for mobility/pain and scar mobility.    PT Home Exercise Plan 05/30/20 calf stretch; 06/05/20: RTB with printout for 4 directions.; 06/08/20: standing gastroc stretch. 06/13/20: heel/toe raises and plantar fascia stretch; 8/26: seated heel raises, seated toe raises, arch doming, towel crunches           Patient will benefit from skilled therapeutic intervention in order to improve the following deficits and impairments:  Abnormal gait, Decreased activity tolerance, Decreased balance, Decreased mobility, Decreased range of motion, Decreased scar mobility, Decreased endurance, Decreased knowledge of precautions, Increased edema, Hypomobility, Difficulty walking, Pain, Increased muscle spasms, Improper body mechanics, Impaired flexibility  Visit Diagnosis: Pain in right ankle and joints of right foot  Other abnormalities of gait and mobility  Other symptoms and signs involving the musculoskeletal system  Muscle weakness (generalized)     Problem List There are no problems to display for this patient.   1:43 PM, 07/05/20 AMearl LatinPT, DPT Physical Therap[place] [id number]: [phone]  Fax:  [phone] Name: Mark SchumMRN: [id number] of Birth: [birth year]

## 2020-07-09 ENCOUNTER — Other Ambulatory Visit: Payer: Self-pay

## 2020-07-09 ENCOUNTER — Ambulatory Visit (HOSPITAL_COMMUNITY): Payer: No Typology Code available for payment source | Admitting: Physical Therapy

## 2020-07-09 ENCOUNTER — Encounter (HOSPITAL_COMMUNITY): Payer: Self-pay | Admitting: Physical Therapy

## 2020-07-09 DIAGNOSIS — M6281 Muscle weakness (generalized): Secondary | ICD-10-CM

## 2020-07-09 DIAGNOSIS — R29898 Other symptoms and signs involving the musculoskeletal system: Secondary | ICD-10-CM

## 2020-07-09 DIAGNOSIS — M25571 Pain in right ankle and joints of right foot: Secondary | ICD-10-CM | POA: Diagnosis not present

## 2020-07-09 DIAGNOSIS — R2689 Other abnormalities of gait and mobility: Secondary | ICD-10-CM

## 2020-07-09 NOTE — Therapy (Signed)
[place], [place], [address] Phone: [phone]   Fax:  [phone]  Physical Therapy Treatment  Patient Details  Name: Mark Anthony MRN: [id number] Date of Birth: [birth year] Referring Provider (PT): Mechele Claude PA-C   Encounter Date: 07/09/2020   PT End of Session - 07/09/20 1654    Visit Number 13    Number of Visits 20    Date for PT Re-Evaluation 08/02/20    Authorization Type [facility] (requesting more visits)    Authorization - Visit Number 13    Authorization - Number of Visits 15    Progress Note Due on Visit 22    PT Start Time 1648    PT Stop Time 1730    PT Time Calculation (min) 42 min    Activity Tolerance Patient tolerated treatment well    Behavior During Therapy [facility] for tasks assessed/performed           Past Medical History:  Diagnosis Date  . Hypertension    no meds at this time.  . Reflux   . TIA (transient ischemic attack)    no meds following    Past Surgical History:  Procedure Laterality Date  . ACHILLES TENDON SURGERY Right 02/02/2020   Procedure: Right Achilles tendon debridement and reconstruction;  Surgeon: Wylene Simmer, MD;  Location: [place];  Service: Orthopedics;  Laterality: Right;  . ELBOW FRACTURE SURGERY      There were no vitals filed for this visit.   Subjective Assessment - 07/09/20 1652    Subjective Patient says his ankle locked up on him over the weekend. Says it wouldn't move for a while but he did some calf stretching and seemed to help it move better. Says pain is not too bad right now about a 3 or 4.    Limitations Lifting;Standing;Walking;House hold activities    How Greenwell can you walk comfortably? 10-15 minutes    Patient Stated Goals Get back to walking normal    Currently in Pain? Yes    Pain Score 3     Pain Location Ankle    Pain Orientation Right    Pain Descriptors / Indicators Sharp    Pain Type Acute pain                              [facility] Adult PT Treatment/Exercise - 07/09/20 0001      Exercises   Exercises Knee/Hip      Knee/Hip Exercises: Standing   Stairs 4 inch, reciprocal, no rail, 5RT     SLS 5 x 10" hold on foam       Ankle Exercises: Seated   BAPS Level 3   20 reps PF/DF, INV/EV      Ankle Exercises: Stretches   Slant Board Stretch 3 reps;30 seconds    Other Stretch knee drive on 18 inch step 5 x 10 second holds    Other Stretch total gym level 32 heelraises  2x10 (eccentric control); squats 2x10 level 32, DF MWM blue band on 18 inch step       Ankle Exercises: Standing   Rocker Board 2 minutes    Rocker Board Limitations DF/PF INV/EV     Heel Raises --    Heel Raises Limitations --                    PT Short Term Goals - 06/26/20 1042  PT SHORT TERM GOAL #1   Title Patient will be independent with HEP in order to improve functional outcomes.    Time 3    Period Weeks    Status Achieved    Target Date 06/20/20      PT SHORT TERM GOAL #2   Title Patient will report at least 25% improvement in symptoms for improved quality of life.    Time 3    Period Weeks    Status Achieved    Target Date 06/20/20             PT Miyamoto Term Goals - 06/26/20 1042      PT Nez TERM GOAL #1   Title Patient will report at least 75% improvement in symptoms for improved quality of life.    Time 6    Period Weeks    Status On-going      PT Menees TERM GOAL #2   Title Patient will improve FOTO score by at least 10 points in order to indicate improved tolerance to activity.    Time 6    Period Weeks    Status On-going      PT Bollman TERM GOAL #3   Title Patient will be able to navigate stairs with reciprocal pattern without compensation in order to demonstrate improved LE strength.    Time 6    Period Weeks    Status On-going      PT Hovsepian TERM GOAL #4   Title Patient will be able to ambulate for at least 21minutes with pain no greater than 1/10 in  order to demonstrate improved ability to ambulate in the community.    Time 6    Period Weeks    Status On-going                 Plan - 07/09/20 1732    Clinical Impression Statement Patient tolerated session well overall today. Continues to be limited by functional RT ankle DF, limiting ability to descend stairs. Added ankle DF MWM. Patient noted some improvement in descending 4 inch steps with this, but continues to have difficulty on 7 inch stairs. Added single leg standing on foam for increased ankle stabilization. Patient educated on purpose and function of all added exercises and cued on proper foot placement with power tower heel raises.    Personal Factors and Comorbidities Comorbidity 3+;Time since onset of injury/illness/exacerbation;Past/Current Experience;Profession    Comorbidities obesity, hx TIA, HTN    Examination-Activity Limitations Lift;Stand;Stairs;Squat;Locomotion Level;Transfers    Examination-Participation Restrictions Lawyer;Shop;Occupation;Meal Prep    Stability/Clinical Decision Making Stable/Uncomplicated    Rehab Potential Fair    PT Frequency 2x / week    PT Duration 4 weeks    PT Treatment/Interventions Aquatic Therapy;Biofeedback;Canalith Repostioning;Cryotherapy;Electrical Stimulation;Moist Heat;Traction;Ultrasound;Parrafin;Fluidtherapy;Contrast Bath;DME Instruction;Gait training;Stair training;Functional mobility training;Therapeutic activities;Therapeutic exercise;Balance training;Orthotic Fit/Training;Patient/family education;Neuromuscular re-education;Manual lymph drainage;Manual techniques;Compression bandaging;Scar mobilization;Passive range of motion;Dry needling;Energy conservation;Splinting;Taping;Vasopneumatic Device;Vestibular;Spinal Manipulations;Joint Manipulations;ADLs/Self Care Home Management;Iontophoresis 4mg /ml Dexamethasone    PT Next Visit Plan Continue gastroc strengthening and appropriate gait training.  Progressed ankle  ROM/mobility and strengthening as able.  Manual for mobility/pain and scar mobility.    PT Home Exercise Plan 05/30/20 calf stretch; 06/05/20: RTB with printout for 4 directions.; 06/08/20: standing gastroc stretch. 06/13/20: heel/toe raises and plantar fascia stretch; 8/26: seated heel raises, seated toe raises, arch doming, towel crunches    Consulted and Agree with Plan of Care Patient           Patient will benefit from skilled  therapeutic intervention in order to improve the following deficits and impairments:  Abnormal gait, Decreased activity tolerance, Decreased balance, Decreased mobility, Decreased range of motion, Decreased scar mobility, Decreased endurance, Decreased knowledge of precautions, Increased edema, Hypomobility, Difficulty walking, Pain, Increased muscle spasms, Improper body mechanics, Impaired flexibility  Visit Diagnosis: Pain in right ankle and joints of right foot  Other abnormalities of gait and mobility  Other symptoms and signs involving the musculoskeletal system  Muscle weakness (generalized)     Problem List There are no problems to display for this patient.  5:38 PM, 07/09/20 Josue Hector PT DPT  Physical Therapist with [facility]  [phone] [place] [address] [place], [place], [address] Phone: [phone]   Fax:  [phone]  Name: Jayln Madeira MRN: [id number] Date of Birth: 07-25-1976

## 2020-07-17 ENCOUNTER — Other Ambulatory Visit: Payer: Self-pay

## 2020-07-17 ENCOUNTER — Ambulatory Visit (HOSPITAL_COMMUNITY): Payer: No Typology Code available for payment source | Admitting: Physical Therapy

## 2020-07-17 DIAGNOSIS — M6281 Muscle weakness (generalized): Secondary | ICD-10-CM

## 2020-07-17 DIAGNOSIS — M25571 Pain in right ankle and joints of right foot: Secondary | ICD-10-CM | POA: Diagnosis not present

## 2020-07-17 DIAGNOSIS — R29898 Other symptoms and signs involving the musculoskeletal system: Secondary | ICD-10-CM

## 2020-07-17 DIAGNOSIS — R2689 Other abnormalities of gait and mobility: Secondary | ICD-10-CM

## 2020-07-17 NOTE — Therapy (Signed)
[place], [place], [address] Phone: [phone]   Fax:  [phone]  Physical Therapy Treatment  Patient Details  Name: Mark Anthony MRN: [id number] Date of Birth: 1976/05/10 Referring Provider (PT): Mechele Claude PA-C   Encounter Date: 07/17/2020   PT End of Session - 07/17/20 1119    Visit Number 14    Number of Visits 20    Date for PT Re-Evaluation 08/02/20    Authorization Type [facility] (requesting more visits)    Authorization - Visit Number 14    Authorization - Number of Visits 15    Progress Note Due on Visit 22    PT Start Time 1045    PT Stop Time 1125    PT Time Calculation (min) 40 min    Activity Tolerance Patient tolerated treatment well    Behavior During Therapy [facility] for tasks assessed/performed           Past Medical History:  Diagnosis Date  . Hypertension    no meds at this time.  . Reflux   . TIA (transient ischemic attack)    no meds following    Past Surgical History:  Procedure Laterality Date  . ACHILLES TENDON SURGERY Right 02/02/2020   Procedure: Right Achilles tendon debridement and reconstruction;  Surgeon: Wylene Simmer, MD;  Location: [place];  Service: Orthopedics;  Laterality: Right;  . ELBOW FRACTURE SURGERY      There were no vitals filed for this visit.   Subjective Assessment - 07/17/20 1049    Subjective Pt states that he had to slam on the brakes last week, he heard a "pop" and has had increased pain ever since.  Pt states that he went the the MD he felt that he broke up some scar tissue and pulled the gastroc but they wanted him to continue with therapy.    Limitations Lifting;Standing;Walking;House hold activities    How [place] can you stand comfortably? 5-10 minutes    How Goldberger can you walk comfortably? 10-15 minutes    Patient Stated Goals Get back to walking normal    Currently in Pain? Yes    Pain Score 4     Pain Location Calf    Pain  Orientation Right    Pain Descriptors / Indicators Sore;Dull    Pain Type Acute pain    Pain Onset More than a month ago    Pain Frequency Intermittent    Aggravating Factors  Pushing on the brakes    Pain Relieving Factors rest                             [facility] Adult PT Treatment/Exercise - 07/17/20 0001      Exercises   Exercises Knee/Hip      Knee/Hip Exercises: Stretches   Gastroc Stretch Right;Limitations    Gastroc Stretch Limitations PROM 3 x30       Knee/Hip Exercises: Standing   Stairs --    SLS x 5       Manual Therapy   Manual Therapy Soft tissue mobilization    Manual therapy comments Manual complete separate than rest of tx    Soft tissue mobilization to decrease spasm, and pain       Ankle Exercises: Stretches   Slant Board Stretch --    Other Stretch --    Other Stretch --      Ankle Exercises: Standing  Rocker Board --    Diplomatic Services operational officer Limitations --      Ankle Exercises: Seated   Ankle Circles/Pumps 15 reps    BAPS Level 3   20 reps PF/DF, INV/EV      Ankle Exercises: Supine   Other Supine Ankle Exercises prone plantarflexion x 10" x 10 into therapist thigh                    PT Short Term Goals - 06/26/20 1042      PT SHORT TERM GOAL #1   Title Patient will be independent with HEP in order to improve functional outcomes.    Time 3    Period Weeks    Status Achieved    Target Date 06/20/20      PT SHORT TERM GOAL #2   Title Patient will report at least 25% improvement in symptoms for improved quality of life.    Time 3    Period Weeks    Status Achieved    Target Date 06/20/20             PT Hattery Term Goals - 06/26/20 1042      PT Meals TERM GOAL #1   Title Patient will report at least 75% improvement in symptoms for improved quality of life.    Time 6    Period Weeks    Status On-going      PT Kibbe TERM GOAL #2   Title Patient will improve FOTO score by at least 10 points in order to indicate  improved tolerance to activity.    Time 6    Period Weeks    Status On-going      PT Capo TERM GOAL #3   Title Patient will be able to navigate stairs with reciprocal pattern without compensation in order to demonstrate improved LE strength.    Time 6    Period Weeks    Status On-going      PT Ilic TERM GOAL #4   Title Patient will be able to ambulate for at least 54minutes with pain no greater than 1/10 in order to demonstrate improved ability to ambulate in the community.    Time 6    Period Weeks    Status On-going                 Plan - 07/17/20 1120    Clinical Impression Statement decreased exercise level due to increased pain following injury of gastroc last week.  Added manual back into program to decrease pain and mm spasms.  PT tolerated treatment well    Personal Factors and Comorbidities Comorbidity 3+;Time since onset of injury/illness/exacerbation;Past/Current Experience;Profession    Comorbidities obesity, hx TIA, HTN    Examination-Activity Limitations Lift;Stand;Stairs;Squat;Locomotion Level;Transfers    Examination-Participation Restrictions Lawyer;Shop;Occupation;Meal Prep    Stability/Clinical Decision Making Stable/Uncomplicated    Rehab Potential Fair    PT Frequency 2x / week    PT Duration 4 weeks    PT Treatment/Interventions Aquatic Therapy;Biofeedback;Canalith Repostioning;Cryotherapy;Electrical Stimulation;Moist Heat;Traction;Ultrasound;Parrafin;Fluidtherapy;Contrast Bath;DME Instruction;Gait training;Stair training;Functional mobility training;Therapeutic activities;Therapeutic exercise;Balance training;Orthotic Fit/Training;Patient/family education;Neuromuscular re-education;Manual lymph drainage;Manual techniques;Compression bandaging;Scar mobilization;Passive range of motion;Dry needling;Energy conservation;Splinting;Taping;Vasopneumatic Device;Vestibular;Spinal Manipulations;Joint Manipulations;ADLs/Self Care Home  Management;Iontophoresis 4mg /ml Dexamethasone    PT Next Visit Plan Gradually increase program to returnto level prior funcioning level . Continue gastroc strengthening and appropriate gait training.  Progressed ankle ROM/mobility and strengthening as able.  Manual for mobility/pain and scar mobility.    PT Home Exercise Plan 05/30/20 calf stretch;  06/05/20: RTB with printout for 4 directions.; 06/08/20: standing gastroc stretch. 06/13/20: heel/toe raises and plantar fascia stretch; 8/26: seated heel raises, seated toe raises, arch doming, towel crunches    Consulted and Agree with Plan of Care Patient           Patient will benefit from skilled therapeutic intervention in order to improve the following deficits and impairments:  Abnormal gait, Decreased activity tolerance, Decreased balance, Decreased mobility, Decreased range of motion, Decreased scar mobility, Decreased endurance, Decreased knowledge of precautions, Increased edema, Hypomobility, Difficulty walking, Pain, Increased muscle spasms, Improper body mechanics, Impaired flexibility  Visit Diagnosis: Pain in right ankle and joints of right foot  Other abnormalities of gait and mobility  Other symptoms and signs involving the musculoskeletal system  Muscle weakness (generalized)     Problem List There are no problems to display for this patient.  Rayetta Humphrey, PT CLT [phone] 07/17/2020, 11:27 AM  [place], [place], [address] Phone: [phone]   Fax:  [phone]  Name: Mark Anthony MRN: [id number] Date of Birth: [birth year]

## 2020-07-20 ENCOUNTER — Encounter (HOSPITAL_COMMUNITY): Payer: Self-pay | Admitting: Physical Therapy

## 2020-07-20 ENCOUNTER — Other Ambulatory Visit: Payer: Self-pay

## 2020-07-20 ENCOUNTER — Ambulatory Visit (HOSPITAL_COMMUNITY): Payer: No Typology Code available for payment source | Admitting: Physical Therapy

## 2020-07-20 DIAGNOSIS — M25571 Pain in right ankle and joints of right foot: Secondary | ICD-10-CM

## 2020-07-20 DIAGNOSIS — M6281 Muscle weakness (generalized): Secondary | ICD-10-CM

## 2020-07-20 DIAGNOSIS — R2689 Other abnormalities of gait and mobility: Secondary | ICD-10-CM

## 2020-07-20 DIAGNOSIS — R29898 Other symptoms and signs involving the musculoskeletal system: Secondary | ICD-10-CM

## 2020-07-20 NOTE — Therapy (Signed)
[place], [place], [address] Phone: [phone]   Fax:  [phone]  Physical Therapy Treatment  Patient Details  Name: Mark Mark MRN: [id number] Date of Birth: 02-Mar-1976 Referring Provider (PT): Mechele Claude PA-C   Encounter Date: 07/20/2020   PT End of Session - 07/20/20 1016    Visit Number 15    Number of Visits 20    Date for PT Re-Evaluation 08/02/20    Authorization Type [facility] (requesting more visits)    Authorization - Visit Number 15    Authorization - Number of Visits 15    Progress Note Due on Visit 22    PT Start Time 1010    PT Stop Time 1050    PT Time Calculation (min) 40 min    Activity Tolerance Patient tolerated treatment well    Behavior During Therapy [facility] for tasks assessed/performed           Past Medical History:  Diagnosis Date  . Hypertension    no meds at this time.  . Reflux   . TIA (transient ischemic attack)    no meds following    Past Surgical History:  Procedure Laterality Date  . ACHILLES TENDON SURGERY Right 02/02/2020   Procedure: Right Achilles tendon debridement and reconstruction;  Surgeon: Wylene Simmer, MD;  Location: [place];  Service: Orthopedics;  Laterality: Right;  . ELBOW FRACTURE SURGERY      There were no vitals filed for this visit.   Subjective Assessment - 07/20/20 1011    Subjective Pt states that his calf is feeling much better, states that he can tell that he is walking better as well    Limitations Lifting;Standing;Walking;House hold activities    How Asper can you stand comfortably? 5-10 minutes    How Fister can you walk comfortably? 10-15 minutes    Patient Stated Goals Get back to walking normal    Currently in Pain? Yes    Pain Score 4     Pain Location Calf    Pain Orientation Right    Pain Descriptors / Indicators Aching    Pain Type Acute pain    Pain Onset More than a month ago    Pain Frequency Intermittent     Aggravating Factors  using to much    Pain Relieving Factors rest                             [facility] Adult PT Treatment/Exercise - 07/20/20 0001      Exercises   Exercises Ankle      Knee/Hip Exercises: Stretches   Gastroc Stretch Right;3 reps;30 seconds   on 4" box   Gastroc Stretch Limitations slant board 3 x 30"      Knee/Hip Exercises: Standing   Heel Raises Limitations toe raises no heel raise x 10     Forward Step Up Right;10 reps;Hand Hold: 0;Step Height: 4"    SLS x 5       Manual Therapy   Manual Therapy Soft tissue mobilization    Manual therapy comments Manual complete separate than rest of tx    Soft tissue mobilization to decrease spasm, and pain       Ankle Exercises: Stretches   Other Stretch total gym level 28 heelraises  2x10 (eccentric control); squats 2x10 level 32, DF MWM blue band on 18 inch step  PT Short Term Goals - 06/26/20 1042      PT SHORT TERM GOAL #1   Title Patient will be independent with HEP in order to improve functional outcomes.    Time 3    Period Weeks    Status Achieved    Target Date 06/20/20      PT SHORT TERM GOAL #2   Title Patient will report at least 25% improvement in symptoms for improved quality of life.    Time 3    Period Weeks    Status Achieved    Target Date 06/20/20             PT Briere Term Goals - 06/26/20 1042      PT Bouley TERM GOAL #1   Title Patient will report at least 75% improvement in symptoms for improved quality of life.    Time 6    Period Weeks    Status On-going      PT Cozza TERM GOAL #2   Title Patient will improve FOTO score by at least 10 points in order to indicate improved tolerance to activity.    Time 6    Period Weeks    Status On-going      PT Carras TERM GOAL #3   Title Patient will be able to navigate stairs with reciprocal pattern without compensation in order to demonstrate improved LE strength.    Time 6    Period Weeks     Status On-going      PT Parodi TERM GOAL #4   Title Patient will be able to ambulate for at least 81minutes with pain no greater than 1/10 in order to demonstrate improved ability to ambulate in the community.    Time 6    Period Weeks    Status On-going                 Plan - 07/20/20 1019    Clinical Impression Statement Pt progressed into strengthening and stretching exercises again since he is now feeling much better. Decreased total gym to 28 due to recent reinjury with no complaint of pain.  Decreased mm spasm noted in mm belly    Personal Factors and Comorbidities Comorbidity 3+;Time since onset of injury/illness/exacerbation;Past/Current Experience;Profession    Comorbidities obesity, hx TIA, HTN    Examination-Activity Limitations Lift;Stand;Stairs;Squat;Locomotion Level;Transfers    Examination-Participation Restrictions Lawyer;Shop;Occupation;Meal Prep    Stability/Clinical Decision Making Stable/Uncomplicated    Clinical Decision Making Low    Rehab Potential Fair    PT Frequency 2x / week    PT Duration 4 weeks    PT Treatment/Interventions Aquatic Therapy;Biofeedback;Canalith Repostioning;Cryotherapy;Electrical Stimulation;Moist Heat;Traction;Ultrasound;Parrafin;Fluidtherapy;Contrast Bath;DME Instruction;Gait training;Stair training;Functional mobility training;Therapeutic activities;Therapeutic exercise;Balance training;Orthotic Fit/Training;Patient/family education;Neuromuscular re-education;Manual lymph drainage;Manual techniques;Compression bandaging;Scar mobilization;Passive range of motion;Dry needling;Energy conservation;Splinting;Taping;Vasopneumatic Device;Vestibular;Spinal Manipulations;Joint Manipulations;ADLs/Self Care Home Management;Iontophoresis 4mg /ml Dexamethasone    PT Next Visit Plan increase program to returnto level prior  level increasing total gym to 30. Marland Kitchen Continue gastroc strengthening and appropriate gait training.  Progressed ankle  ROM/mobility and strengthening as able.  Manual for mobility/pain and scar mobility.    PT Home Exercise Plan 05/30/20 calf stretch; 06/05/20: RTB with printout for 4 directions.; 06/08/20: standing gastroc stretch. 06/13/20: heel/toe raises and plantar fascia stretch; 8/26: seated heel raises, seated toe raises, arch doming, towel crunches; 9/24: vector stace step ups           Patient will benefit from skilled therapeutic intervention in order to improve the following deficits and impairments:  Abnormal gait, Decreased activity tolerance, Decreased balance, Decreased mobility, Decreased range of motion, Decreased scar mobility, Decreased endurance, Decreased knowledge of precautions, Increased edema, Hypomobility, Difficulty walking, Pain, Increased muscle spasms, Improper body mechanics, Impaired flexibility  Visit Diagnosis: Other abnormalities of gait and mobility  Pain in right ankle and joints of right foot  Other symptoms and signs involving the musculoskeletal system  Muscle weakness (generalized)     Problem List There are no problems to display for this patient.  Rayetta Humphrey, PT CLT [phone] 07/20/2020, 12:43 PM  [place] [address] [place], [place], [address] Phone: [phone]   Fax:  [phone]  Name: Mark Mark MRN: [id number] Date of Birth: June 27, 1976

## 2020-07-24 ENCOUNTER — Telehealth (HOSPITAL_COMMUNITY): Payer: Self-pay | Admitting: Physical Therapy

## 2020-07-24 ENCOUNTER — Ambulatory Visit (HOSPITAL_COMMUNITY): Payer: No Typology Code available for payment source | Admitting: Physical Therapy

## 2020-07-24 NOTE — Telephone Encounter (Signed)
pt called to cx today's appt due to he has another appt and will be running late.

## 2020-07-26 ENCOUNTER — Encounter (HOSPITAL_COMMUNITY): Payer: Self-pay | Admitting: Physical Therapy

## 2020-07-26 ENCOUNTER — Other Ambulatory Visit: Payer: Self-pay

## 2020-07-26 ENCOUNTER — Ambulatory Visit (HOSPITAL_COMMUNITY): Payer: No Typology Code available for payment source | Admitting: Physical Therapy

## 2020-07-26 DIAGNOSIS — M6281 Muscle weakness (generalized): Secondary | ICD-10-CM

## 2020-07-26 DIAGNOSIS — R29898 Other symptoms and signs involving the musculoskeletal system: Secondary | ICD-10-CM

## 2020-07-26 DIAGNOSIS — M25571 Pain in right ankle and joints of right foot: Secondary | ICD-10-CM | POA: Diagnosis not present

## 2020-07-26 DIAGNOSIS — R2689 Other abnormalities of gait and mobility: Secondary | ICD-10-CM

## 2020-07-26 NOTE — Therapy (Signed)
[facility], [place], [address] Phone: [phone]   Fax:  [phone]  Physical Therapy Treatment  Patient Details  Name: Mark Anthony MRN: [id number] Date of Birth: September 04, 1976 Referring Provider (PT): Mechele Claude PA-C   Encounter Date: 07/26/2020   PT End of Session - 07/26/20 1041    Visit Number 16    Number of Visits 20    Date for PT Re-Evaluation 08/02/20    Authorization Type [facility]    Authorization Time Period 15 visits approved (06/22/20-12/1920)    Authorization - Visit Number 8    Authorization - Number of Visits 15    Progress Note Due on Visit 22    PT Start Time 1035    PT Stop Time 1115    PT Time Calculation (min) 40 min    Activity Tolerance Patient tolerated treatment well    Behavior During Therapy [facility] for tasks assessed/performed           Past Medical History:  Diagnosis Date   Hypertension    no meds at this time.   Reflux    TIA (transient ischemic attack)    no meds following    Past Surgical History:  Procedure Laterality Date   ACHILLES TENDON SURGERY Right 02/02/2020   Procedure: Right Achilles tendon debridement and reconstruction;  Surgeon: Wylene Simmer, MD;  Location: [facility];  Service: Orthopedics;  Laterality: Right;   ELBOW FRACTURE SURGERY      There were no vitals filed for this visit.   Subjective Assessment - 07/26/20 1041    Subjective Patient says he is feeling good today. Says he was not able to come on Tuesday but he was able to do his exercises at home. Says he is doing well with this.    Limitations Lifting;Standing;Walking;House hold activities    How [place] can you stand comfortably? 5-10 minutes    How [place] can you walk comfortably? 10-15 minutes    Patient Stated Goals Get back to walking normal    Currently in Pain? No/denies    Pain Onset More than a month ago                             [facility] Adult PT  Treatment/Exercise - 07/26/20 0001      Knee/Hip Exercises: Machines for Strengthening   Cybex Leg Press 2 x 10 50#       Knee/Hip Exercises: Standing   Forward Lunges Right;1 set;15 reps   RLE on 8 inch box    Forward Step Up Right;15 reps;Hand Hold: 1;Step Height: 4"    Step Down Right;1 set;15 reps;Hand Hold: 1;Step Height: 4"    SLS 3 x 15" solid floor each leg   attempted SLS on foam, too difficult on RLE    SLS with Vectors 3 x 5" holds 3 ways on RLE       Ankle Exercises: Seated   BAPS Level 3;Sitting;15 reps   PF/DF/INV/EV  RT foot      Ankle Exercises: Stretches   Gastroc Stretch 3 reps;30 seconds    Gastroc Stretch Limitations slant board       Ankle Exercises: Standing   Rocker Board 2 minutes    Rocker Board Limitations DF/PF     Heel Raises Both;20 reps    Heel Raises Limitations on incline slope  PT Short Term Goals - 06/26/20 1042      PT SHORT TERM GOAL #1   Title Patient will be independent with HEP in order to improve functional outcomes.    Time 3    Period Weeks    Status Achieved    Target Date 06/20/20      PT SHORT TERM GOAL #2   Title Patient will report at least 25% improvement in symptoms for improved quality of life.    Time 3    Period Weeks    Status Achieved    Target Date 06/20/20             PT [place] Term Goals - 06/26/20 1042      PT [place] TERM GOAL #1   Title Patient will report at least 75% improvement in symptoms for improved quality of life.    Time 6    Period Weeks    Status On-going      PT [place] TERM GOAL #2   Title Patient will improve FOTO score by at least 10 points in order to indicate improved tolerance to activity.    Time 6    Period Weeks    Status On-going      PT [place] TERM GOAL #3   Title Patient will be able to navigate stairs with reciprocal pattern without compensation in order to demonstrate improved LE strength.    Time 6    Period Weeks    Status On-going      PT [place]  TERM GOAL #4   Title Patient will be able to ambulate for at least 59minutes with pain no greater than 1/10 in order to demonstrate improved ability to ambulate in the community.    Time 6    Period Weeks    Status On-going                 Plan - 07/26/20 1530    Clinical Impression Statement Patient tolerated ther ex progressions well today. Patient shows improving ankle DF ROM, but continues to be limited by reduced balance and stability on RLE. Patient was well challenged with attempted single leg stance on foam pad, but performs well on solid floor. Patient required verbal cues and demo for proper form with added lunges on 8 inch box. Added leg press today for increased strengthening and RT ankle mobility.    Personal Factors and Comorbidities Comorbidity 3+;Time since onset of injury/illness/exacerbation;Past/Current Experience;Profession    Comorbidities obesity, hx TIA, HTN    Examination-Activity Limitations Lift;Stand;Stairs;Squat;Locomotion Level;Transfers    Examination-Participation Restrictions Lawyer;Shop;Occupation;Meal Prep    Stability/Clinical Decision Making Stable/Uncomplicated    Rehab Potential Fair    PT Frequency 2x / week    PT Duration 4 weeks    PT Treatment/Interventions Aquatic Therapy;Biofeedback;Canalith Repostioning;Cryotherapy;Electrical Stimulation;Moist Heat;Traction;Ultrasound;Parrafin;Fluidtherapy;Contrast Bath;DME Instruction;Gait training;Stair training;Functional mobility training;Therapeutic activities;Therapeutic exercise;Balance training;Orthotic Fit/Training;Patient/family education;Neuromuscular re-education;Manual lymph drainage;Manual techniques;Compression bandaging;Scar mobilization;Passive range of motion;Dry needling;Energy conservation;Splinting;Taping;Vasopneumatic Device;Vestibular;Spinal Manipulations;Joint Manipulations;ADLs/Self Care Home Management;Iontophoresis 4mg /ml Dexamethasone    PT Next Visit Plan increase program  to returnto level prior  level increasing total gym to 30. Marland Kitchen Continue gastroc strengthening and appropriate gait training.  Progressed ankle ROM/mobility and strengthening as able.  Manual for mobility/pain and scar mobility.    PT Home Exercise Plan 05/30/20 calf stretch; 06/05/20: RTB with printout for 4 directions.; 06/08/20: standing gastroc stretch. 06/13/20: heel/toe raises and plantar fascia stretch; 8/26: seated heel raises, seated toe raises, arch doming, towel crunches; 9/24: vector stace step ups  Consulted and Agree with Plan of Care Patient           Patient will benefit from skilled therapeutic intervention in order to improve the following deficits and impairments:  Abnormal gait, Decreased activity tolerance, Decreased balance, Decreased mobility, Decreased range of motion, Decreased scar mobility, Decreased endurance, Decreased knowledge of precautions, Increased edema, Hypomobility, Difficulty walking, Pain, Increased muscle spasms, Improper body mechanics, Impaired flexibility  Visit Diagnosis: Other abnormalities of gait and mobility  Pain in right ankle and joints of right foot  Other symptoms and signs involving the musculoskeletal system  Muscle weakness (generalized)     Problem List There are no problems to display for this patient.   3:35 PM, 07/26/20 Josue Hector PT DPT  Physical Therapist with [facility]  [phone] [place] [address] [place], [place], [address] Phone: [phone]   Fax:  [phone]  Name: Mark Anthony MRN: [id number] Date of Birth: [birth year]

## 2020-08-09 ENCOUNTER — Ambulatory Visit (HOSPITAL_COMMUNITY): Payer: No Typology Code available for payment source | Attending: Student | Admitting: Physical Therapy

## 2020-08-09 ENCOUNTER — Other Ambulatory Visit: Payer: Self-pay

## 2020-08-09 ENCOUNTER — Encounter (HOSPITAL_COMMUNITY): Payer: Self-pay | Admitting: Physical Therapy

## 2020-08-09 DIAGNOSIS — M6281 Muscle weakness (generalized): Secondary | ICD-10-CM | POA: Diagnosis present

## 2020-08-09 DIAGNOSIS — M25571 Pain in right ankle and joints of right foot: Secondary | ICD-10-CM | POA: Insufficient documentation

## 2020-08-09 DIAGNOSIS — R29898 Other symptoms and signs involving the musculoskeletal system: Secondary | ICD-10-CM | POA: Insufficient documentation

## 2020-08-09 DIAGNOSIS — R2689 Other abnormalities of gait and mobility: Secondary | ICD-10-CM | POA: Diagnosis present

## 2020-08-09 NOTE — Therapy (Signed)
[facility] [address] [place], [place], [address] Phone: [phone]   Fax:  [phone]  Physical Therapy Treatment  Patient Details  Name: Mark Anthony MRN: [id number] Date of Birth: 15-Jul-1976 Referring Provider (PT): Mechele Claude PA-C  Progress Note Reporting Period 06/26/20 to 08/09/20  See note below for Objective Data and Assessment of Progress/Goals.       Encounter Date: 08/09/2020   PT End of Session - 08/09/20 1007    Visit Number 17    Number of Visits 23    Date for PT Re-Evaluation 08/31/20    Authorization Type [facility]    Authorization Time Period 15 visits approved (06/22/20-12/1920)    Authorization - Visit Number 9    Authorization - Number of Visits 15    Progress Note Due on Visit 23    PT Start Time 0955    PT Stop Time 1030    PT Time Calculation (min) 35 min    Activity Tolerance Patient tolerated treatment well    Behavior During Therapy [facility] for tasks assessed/performed           Past Medical History:  Diagnosis Date   Hypertension    no meds at this time.   Reflux    TIA (transient ischemic attack)    no meds following    Past Surgical History:  Procedure Laterality Date   ACHILLES TENDON SURGERY Right 02/02/2020   Procedure: Right Achilles tendon debridement and reconstruction;  Surgeon: Wylene Simmer, MD;  Location: [facility];  Service: Orthopedics;  Laterality: Right;   ELBOW FRACTURE SURGERY      There were no vitals filed for this visit.   Subjective Assessment - 08/09/20 1005    Subjective Patient says his foot is still very stiff. Says he is still having bad cramps. Walking is difficult. Patient reports 40% improvement in function since starting therapy. Says she still has trouble walking and ambulating stairs.    Limitations Lifting;Standing;Walking;House hold activities    How [place] can you stand comfortably? 5-10 minutes    How [place] can you walk comfortably? 10-15  minutes    Patient Stated Goals Get back to walking normal    Currently in Pain? Yes    Pain Score 3     Pain Location Calf    Pain Orientation Right    Pain Descriptors / Indicators Tightness;Cramping    Pain Type Acute pain    Pain Onset More than a month ago    Pain Frequency Intermittent    Aggravating Factors  walking, stairs    Pain Relieving Factors Rest, non WB    Effect of Pain on Daily Activities Limiting              [facility] PT Assessment - 08/09/20 0001      Assessment   Medical Diagnosis Rupture of R achilles tendon    Referring Provider (PT) Mechele Claude PA-C    Onset Date/Surgical Date 02/02/20    Next MD Visit 08/15/20    Prior Therapy no      Precautions   Precautions None      Restrictions   Weight Bearing Restrictions No      Balance Screen   Has the patient fallen in the past 6 months No      Prior Function   Level of Independence Independent    Vocation Full time employment    Vocation Requirements Standing, walking      Cognition  Overall Cognitive Status Within Functional Limits for tasks assessed      Observation/Other Assessments   Focus on Therapeutic Outcomes (FOTO)  62% limited    was 69% limited                                PT Education - 08/09/20 1007    Education Details on reassessment findings and POC    Person(s) Educated Patient    Methods Explanation    Comprehension Verbalized understanding            PT Short Term Goals - 06/26/20 1042      PT SHORT TERM GOAL #1   Title Patient will be independent with HEP in order to improve functional outcomes.    Time 3    Period Weeks    Status Achieved    Target Date 06/20/20      PT SHORT TERM GOAL #2   Title Patient will report at least 25% improvement in symptoms for improved quality of life.    Time 3    Period Weeks    Status Achieved    Target Date 06/20/20             PT [place] Term Goals - 08/09/20 1017      PT [place] TERM GOAL #1    Title Patient will report at least 75% improvement in symptoms for improved quality of life.    Baseline Currently reports 40%    Time 6    Period Weeks    Status On-going      PT [place] TERM GOAL #2   Title Patient will improve FOTO score by at least 10 points in order to indicate improved tolerance to activity.    Baseline Current 10% improvement reported    Time 6    Period Weeks    Status Achieved      PT [place] TERM GOAL #3   Title Patient will be able to navigate stairs with reciprocal pattern without compensation in order to demonstrate improved LE strength.    Baseline Can ascend reciprocal but descends with step to pattern    Time 6    Period Weeks    Status On-going      PT [place] TERM GOAL #4   Title Patient will be able to ambulate for at least 30 minutes with pain no greater than 1/10 in order to demonstrate improved ability to ambulate in the community.    Baseline Can walk up to 15-20 minutes but with increased pain    Time 6    Period Weeks    Status On-going                 Plan - 08/09/20 1029    Clinical Impression Statement Patient shows some improvement toward therapy goals. Patient with improved functional ability per reported FOTO score. Patient continues to have limited self-perception of capabilities and is concerned about returning to work tasks. Patient still limited by ankle stiffness, ROM, and descending stairs which continue to negatively impact functional ability. Patient will continue to benefit from skilled therapy services to address remaining deficits to reduce pain and improve LOF with ADLs and functional mobility.    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;Shop;Occupation;Meal Prep  Stability/Clinical Decision Making Stable/Uncomplicated    Rehab Potential Fair    PT Frequency 2x / week    PT Duration 3 weeks    PT Treatment/Interventions Aquatic Therapy;Biofeedback;Canalith Repostioning;Cryotherapy;Electrical Stimulation;Moist Heat;Traction;Ultrasound;Parrafin;Fluidtherapy;Contrast Bath;DME Instruction;Gait training;Stair training;Functional mobility training;Therapeutic activities;Therapeutic exercise;Balance training;Orthotic Fit/Training;Patient/family education;Neuromuscular re-education;Manual lymph drainage;Manual techniques;Compression bandaging;Scar mobilization;Passive range of motion;Dry needling;Energy conservation;Splinting;Taping;Vasopneumatic Device;Vestibular;Spinal Manipulations;Joint Manipulations;ADLs/Self Care Home Management;Iontophoresis 4mg /ml Dexamethasone    PT Next Visit Plan increase program to returnto level prior  level increasing total gym to 30. Marland Kitchen Continue gastroc strengthening and appropriate gait training.  Progressed ankle ROM/mobility and strengthening as able.  Manual for mobility/pain and scar mobility.    PT Home Exercise Plan 05/30/20 calf stretch; 06/05/20: RTB with printout for 4 directions.; 06/08/20: standing gastroc stretch. 06/13/20: heel/toe raises and plantar fascia stretch; 8/26: seated heel raises, seated toe raises, arch doming, towel crunches; 9/24: vector stace step ups    Consulted and Agree with Plan of Care Patient           Patient will benefit from skilled therapeutic intervention in order to improve the following deficits and impairments:  Abnormal gait, Decreased activity tolerance, Decreased balance, Decreased mobility, Decreased range of motion, Decreased scar mobility, Decreased endurance, Decreased knowledge of precautions, Increased edema, Hypomobility, Difficulty walking, Pain, Increased muscle spasms, Improper body mechanics, Impaired flexibility  Visit Diagnosis: Other  abnormalities of gait and mobility  Pain in right ankle and joints of right foot  Other symptoms and signs involving the musculoskeletal system  Muscle weakness (generalized)     Problem List There are no problems to display for this patient.   10:34 AM, 08/09/20 Mark Anthony PT DPT  Physical Therapist with [facility]  [phone] [place] [address] [place], [place], [address] Phone: [phone]   Fax:  [phone]  Name: Mark Anthony MRN: [id number] Date of Birth: 1975/12/22

## 2020-08-14 ENCOUNTER — Ambulatory Visit (HOSPITAL_COMMUNITY): Payer: No Typology Code available for payment source | Admitting: Physical Therapy

## 2020-08-14 ENCOUNTER — Other Ambulatory Visit: Payer: Self-pay

## 2020-08-14 ENCOUNTER — Encounter (HOSPITAL_COMMUNITY): Payer: Self-pay | Admitting: Physical Therapy

## 2020-08-14 DIAGNOSIS — R2689 Other abnormalities of gait and mobility: Secondary | ICD-10-CM | POA: Diagnosis not present

## 2020-08-14 DIAGNOSIS — M25571 Pain in right ankle and joints of right foot: Secondary | ICD-10-CM

## 2020-08-14 DIAGNOSIS — R29898 Other symptoms and signs involving the musculoskeletal system: Secondary | ICD-10-CM

## 2020-08-14 DIAGNOSIS — M6281 Muscle weakness (generalized): Secondary | ICD-10-CM

## 2020-08-14 NOTE — Therapy (Signed)
[facility] [place], [place], [address] Phone: [phone]   Fax:  [phone]  Physical Therapy Treatment  Patient Details  Name: Mark Anthony MRN: [id number] Date of Birth: 1975-12-06 Referring Provider (PT): Mechele Claude PA-C   Encounter Date: 08/14/2020   PT End of Session - 08/14/20 1536    Visit Number 18    Number of Visits 23    Date for PT Re-Evaluation 08/31/20    Authorization Type [facility]    Authorization Time Period 15 visits approved (06/22/20-12/1920)    Authorization - Visit Number 10    Authorization - Number of Visits 15    Progress Note Due on Visit 23    PT Start Time 1884    PT Stop Time 1615    PT Time Calculation (min) 40 min    Activity Tolerance Patient tolerated treatment well    Behavior During Therapy [facility] for tasks assessed/performed           Past Medical History:  Diagnosis Date  . Hypertension    no meds at this time.  . Reflux   . TIA (transient ischemic attack)    no meds following    Past Surgical History:  Procedure Laterality Date  . ACHILLES TENDON SURGERY Right 02/02/2020   Procedure: Right Achilles tendon debridement and reconstruction;  Surgeon: Wylene Simmer, MD;  Location: [facility];  Service: Orthopedics;  Laterality: Right;  . ELBOW FRACTURE SURGERY      There were no vitals filed for this visit.   Subjective Assessment - 08/14/20 1536    Subjective Patient states his ankle has been so so. He continues to have trouble with standing and descending stairs.    Limitations Lifting;Standing;Walking;House hold activities    How [place] can you stand comfortably? 5-10 minutes    How [place] can you walk comfortably? 10-15 minutes    Patient Stated Goals Get back to walking normal    Currently in Pain? Yes    Pain Score 3     Pain Location Ankle    Pain Orientation Right    Pain Descriptors / Indicators Tightness    Pain Type Acute pain    Pain Onset More than a  month ago                             [place] [facility] Adult PT Treatment/Exercise - 08/14/20 0001      Knee/Hip Exercises: Machines for Strengthening   Other Machine --      Knee/Hip Exercises: Standing   Step Down Right;Hand Hold: 1;Step Height: 4";10 reps;3 sets    SLS with Vectors 5 x 5" holds 3 ways on RLE       Ankle Exercises: Stretches   Other Stretch knee drive on 18 inch step 5 x 20 second holds    Other Stretch total gym level 32 heelraises  2x10 (eccentric control); squats 2x10 level 34                  PT Education - 08/14/20 1536    Education Details Patient educated on HEP, mechanics of exercise, gradually increasing activity    Person(s) Educated Patient    Methods Explanation;Demonstration    Comprehension Verbalized understanding;Returned demonstration            PT Short Term Goals - 06/26/20 1042      PT SHORT TERM GOAL #1   Title Patient  will be independent with HEP in order to improve functional outcomes.    Time 3    Period Weeks    Status Achieved    Target Date 06/20/20      PT SHORT TERM GOAL #2   Title Patient will report at least 25% improvement in symptoms for improved quality of life.    Time 3    Period Weeks    Status Achieved    Target Date 06/20/20             PT [place] Term Goals - 08/09/20 1017      PT [place] TERM GOAL #1   Title Patient will report at least 75% improvement in symptoms for improved quality of life.    Baseline Currently reports 40%    Time 6    Period Weeks    Status On-going      PT [place]t TERM GOAL #2   Title Patient will improve FOTO score by at least 10 points in order to indicate improved tolerance to activity.    Baseline Current 10% improvement reported    Time 6    Period Weeks    Status Achieved      PT [place] TERM GOAL #3   Title Patient will be able to navigate stairs with reciprocal pattern without compensation in order to demonstrate improved LE strength.    Baseline Can ascend  reciprocal but descends with step to pattern    Time 6    Period Weeks    Status On-going      PT [place] TERM GOAL #4   Title Patient will be able to ambulate for at least 30 minutes with pain no greater than 1/10 in order to demonstrate improved ability to ambulate in the community.    Baseline Can walk up to 15-20 minutes but with increased pain    Time 6    Period Weeks    Status On-going                 Plan - 08/14/20 1537    Clinical Impression Statement Patient educated on gradually increasing activity for improving activity tolerance and endurance. Patient completes knee drives for improving DF ROM at beginning of session and is able to progress to increased incline on total gym. Patient showing improving DF ROM but continues to ambulate with foot externally rotated with flat foot and wide BOS. Patient showing improving step down mechanics with c/o tightness but no pain. Patient educated on proper heel toe gait mechanics with limited carry over due to c/o R arch pain and impaired balance. Patient with impaired balance with SLS with vectors requiring frequent UE support. Patient will continue to benefit from skilled physical therapy in order to reduce impairment and improve function.    Personal Factors and Comorbidities Comorbidity 3+;Time since onset of injury/illness/exacerbation;Past/Current Experience;Profession    Comorbidities obesity, hx TIA, HTN    Examination-Activity Limitations Lift;Stand;Stairs;Squat;Locomotion Level;Transfers    Examination-Participation Restrictions Lawyer;Shop;Occupation;Meal Prep    Stability/Clinical Decision Making Stable/Uncomplicated    Rehab Potential Fair    PT Frequency 2x / week    PT Duration 3 weeks    PT Treatment/Interventions Aquatic Therapy;Biofeedback;Canalith Repostioning;Cryotherapy;Electrical Stimulation;Moist Heat;Traction;Ultrasound;Parrafin;Fluidtherapy;Contrast Bath;DME Instruction;Gait training;Stair  training;Functional mobility training;Therapeutic activities;Therapeutic exercise;Balance training;Orthotic Fit/Training;Patient/family education;Neuromuscular re-education;Manual lymph drainage;Manual techniques;Compression bandaging;Scar mobilization;Passive range of motion;Dry needling;Energy conservation;Splinting;Taping;Vasopneumatic Device;Vestibular;Spinal Manipulations;Joint Manipulations;ADLs/Self Care Home Management;Iontophoresis 4mg /ml Dexamethasone    PT Next Visit Plan increase program to returnto level prior  level Continue gastroc strengthening and  appropriate gait training.  Progressed ankle ROM/mobility and strengthening as able.  Manual for mobility/pain and scar mobility.    PT Home Exercise Plan 05/30/20 calf stretch; 06/05/20: RTB with printout for 4 directions.; 06/08/20: standing gastroc stretch. 06/13/20: heel/toe raises and plantar fascia stretch; 8/26: seated heel raises, seated toe raises, arch doming, towel crunches; 9/24: vector stace step ups    Consulted and Agree with Plan of Care Patient           Patient will benefit from skilled therapeutic intervention in order to improve the following deficits and impairments:  Abnormal gait, Decreased activity tolerance, Decreased balance, Decreased mobility, Decreased range of motion, Decreased scar mobility, Decreased endurance, Decreased knowledge of precautions, Increased edema, Hypomobility, Difficulty walking, Pain, Increased muscle spasms, Improper body mechanics, Impaired flexibility  Visit Diagnosis: Other abnormalities of gait and mobility  Pain in right ankle and joints of right foot  Other symptoms and signs involving the musculoskeletal system  Muscle weakness (generalized)     Problem List There are no problems to display for this patient.   4:17 PM, 08/14/20 Mearl Latin PT, DPT Physical Therapist at [facility] [place], [place], [address] Phone: [phone]   Fax:  [phone]  Name: Mark Anthony MRN: [id number] Date of Birth: January 01, 1976

## 2020-08-17 ENCOUNTER — Ambulatory Visit (HOSPITAL_COMMUNITY): Payer: No Typology Code available for payment source | Admitting: Physical Therapy

## 2020-08-17 ENCOUNTER — Other Ambulatory Visit: Payer: Self-pay

## 2020-08-17 DIAGNOSIS — R29898 Other symptoms and signs involving the musculoskeletal system: Secondary | ICD-10-CM

## 2020-08-17 DIAGNOSIS — R2689 Other abnormalities of gait and mobility: Secondary | ICD-10-CM

## 2020-08-17 DIAGNOSIS — M25571 Pain in right ankle and joints of right foot: Secondary | ICD-10-CM

## 2020-08-17 NOTE — Therapy (Signed)
[place], [place], [address] Phone: [phone]   Fax:  [phone]  Physical Therapy Treatment  Patient Details  Name: Mark Anthony MRN: [id number] Date of Birth: [birth year] Referring Provider (PT): Mechele Claude PA-C   Encounter Date: 08/17/2020   PT End of Session - 08/17/20 1042    Visit Number 19    Number of Visits 23    Date for PT Re-Evaluation 08/31/20    Authorization Type [facility]    Authorization Time Period 15 visits approved (06/22/20-12/1920)nancy requested more visits ; Cindy requested her to call [facility] to see if they have been approved or notl    Authorization - Visit Number 19    Authorization - Number of Visits 15    Progress Note Due on Visit 23    PT Start Time 1000    PT Stop Time 1045    PT Time Calculation (min) 45 min    Activity Tolerance Patient tolerated treatment well    Behavior During Therapy WFL for tasks assessed/performed           Past Medical History:  Diagnosis Date   Hypertension    no meds at this time.   Reflux    TIA (transient ischemic attack)    no meds following    Past Surgical History:  Procedure Laterality Date   ACHILLES TENDON SURGERY Right 02/02/2020   Procedure: Right Achilles tendon debridement and reconstruction;  Surgeon: Wylene Simmer, MD;  Location: [facility];  Service: Orthopedics;  Laterality: Right;   ELBOW FRACTURE SURGERY      There were no vitals filed for this visit.   Subjective Assessment - 08/17/20 1002    Subjective Pt states he is doing his exercise; he has pain in his calf but the achilles does not actually hurt.    Limitations Lifting;Standing;Walking;House hold activities    How Tillett can you stand comfortably? 5-10 minutes    How Hummel can you walk comfortably? 10-15 minutes    Patient Stated Goals Get back to walking normal    Currently in Pain? Yes    Pain Score 4     Pain Location Calf    Pain Orientation Right     Pain Descriptors / Indicators Sore    Pain Type Chronic pain    Pain Onset More than a month ago    Pain Frequency Intermittent    Aggravating Factors  walking    Pain Relieving Factors rest, massage    Effect of Pain on Daily Activities limit                             [facility] Adult PT Treatment/Exercise - 08/17/20 0001      Exercises   Exercises Ankle      Manual Therapy   Manual Therapy Soft tissue mobilization    Manual therapy comments Manual complete separate than rest of tx    Soft tissue mobilization to decrease spasm, and pain       Ankle Exercises: Stretches   Soleus Stretch 30 seconds;3 reps   slant board   Gastroc Stretch 3 reps;30 seconds    Slant Board Stretch 3 reps;30 seconds      Ankle Exercises: Standing   BAPS Limitations level 3 x 10 reps each     Vector Stance Right;3 reps;5 seconds    SLS 1x 20"     Toe Raise 15 reps  Other Standing Ankle Exercises gt arms swinging as fast as possible to improve gt mechanices     Other Standing Ankle Exercises power tower RT heel raise at 23 x 10                     PT Short Term Goals - 06/26/20 1042      PT SHORT TERM GOAL #1   Title Patient will be independent with HEP in order to improve functional outcomes.    Time 3    Period Weeks    Status Achieved    Target Date 06/20/20      PT SHORT TERM GOAL #2   Title Patient will report at least 25% improvement in symptoms for improved quality of life.    Time 3    Period Weeks    Status Achieved    Target Date 06/20/20             PT Krasowski Term Goals - 08/09/20 1017      PT Noviello TERM GOAL #1   Title Patient will report at least 75% improvement in symptoms for improved quality of life.    Baseline Currently reports 40%    Time 6    Period Weeks    Status On-going      PT Laduke TERM GOAL #2   Title Patient will improve FOTO score by at least 10 points in order to indicate improved tolerance to activity.    Baseline Current  10% improvement reported    Time 6    Period Weeks    Status Achieved      PT Nicolaisen TERM GOAL #3   Title Patient will be able to navigate stairs with reciprocal pattern without compensation in order to demonstrate improved LE strength.    Baseline Can ascend reciprocal but descends with step to pattern    Time 6    Period Weeks    Status On-going      PT Mckiernan TERM GOAL #4   Title Patient will be able to ambulate for at least 30 minutes with pain no greater than 1/10 in order to demonstrate improved ability to ambulate in the community.    Baseline Can walk up to 15-20 minutes but with increased pain    Time 6    Period Weeks    Status On-going                 Plan - 08/17/20 1048    Clinical Impression Statement NOted edema in achilles tendon with manual therefore therapist measured and gave pt information for compression stocking.  Pt noted to have 75% of wt on Lt LE with heelraises therefore switched to total gym and gravity reduced level and had pt complete exercises with Rt only.   Added Baps for improved proprioception and strength.    Personal Factors and Comorbidities Comorbidity 3+;Time since onset of injury/illness/exacerbation;Past/Current Experience;Profession    Comorbidities obesity, hx TIA, HTN    Examination-Activity Limitations Lift;Stand;Stairs;Squat;Locomotion Level;Transfers    Examination-Participation Restrictions Lawyer;Shop;Occupation;Meal Prep    Stability/Clinical Decision Making Stable/Uncomplicated    Rehab Potential Fair    PT Frequency 2x / week    PT Duration 3 weeks    PT Treatment/Interventions Aquatic Therapy;Biofeedback;Canalith Repostioning;Cryotherapy;Electrical Stimulation;Moist Heat;Traction;Ultrasound;Parrafin;Fluidtherapy;Contrast Bath;DME Instruction;Gait training;Stair training;Functional mobility training;Therapeutic activities;Therapeutic exercise;Balance training;Orthotic Fit/Training;Patient/family education;Neuromuscular  re-education;Manual lymph drainage;Manual techniques;Compression bandaging;Scar mobilization;Passive range of motion;Dry needling;Energy conservation;Splinting;Taping;Vasopneumatic Device;Vestibular;Spinal Manipulations;Joint Manipulations;ADLs/Self Care Home Management;Iontophoresis 4mg /ml Dexamethasone    PT Next Visit  Plan increase program to returnto level prior  level Continue gastroc strengthening and appropriate gait training.  Progressed ankle ROM/mobility and strengthening as able.  Manual for mobility/pain and scar mobility.    PT Home Exercise Plan 05/30/20 calf stretch; 06/05/20: RTB with printout for 4 directions.; 06/08/20: standing gastroc stretch. 06/13/20: heel/toe raises and plantar fascia stretch; 8/26: seated heel raises, seated toe raises, arch doming, towel crunches; 9/24: vector stace step ups    Consulted and Agree with Plan of Care Patient           Patient will benefit from skilled therapeutic intervention in order to improve the following deficits and impairments:  Abnormal gait, Decreased activity tolerance, Decreased balance, Decreased mobility, Decreased range of motion, Decreased scar mobility, Decreased endurance, Decreased knowledge of precautions, Increased edema, Hypomobility, Difficulty walking, Pain, Increased muscle spasms, Improper body mechanics, Impaired flexibility  Visit Diagnosis: Other abnormalities of gait and mobility  Pain in right ankle and joints of right foot  Other symptoms and signs involving the musculoskeletal system     Problem List There are no problems to display for this patient.  Rayetta Humphrey, PT CLT [phone] 08/17/2020, 10:52 AM  [facility] [address] [place], [place], [address] Phone: [phone]   Fax:  [phone]  Name: Mark Anthony MRN: [id number] Date of Birth: Feb 25, 1976

## 2020-08-20 ENCOUNTER — Ambulatory Visit (HOSPITAL_COMMUNITY): Payer: No Typology Code available for payment source | Admitting: Physical Therapy

## 2020-08-20 ENCOUNTER — Other Ambulatory Visit: Payer: Self-pay

## 2020-08-20 ENCOUNTER — Encounter (HOSPITAL_COMMUNITY): Payer: Self-pay | Admitting: Physical Therapy

## 2020-08-20 DIAGNOSIS — R29898 Other symptoms and signs involving the musculoskeletal system: Secondary | ICD-10-CM

## 2020-08-20 DIAGNOSIS — R2689 Other abnormalities of gait and mobility: Secondary | ICD-10-CM

## 2020-08-20 DIAGNOSIS — M25571 Pain in right ankle and joints of right foot: Secondary | ICD-10-CM

## 2020-08-20 NOTE — Therapy (Signed)
[place], [place], [address] Phone: [phone]   Fax:  [phone]  Physical Therapy Treatment  Patient Details  Name: Mark Anthony MRN: [id number] Date of Birth: [birth year] Referring Provider (PT): Mechele Claude PA-C   Encounter Date: 08/20/2020   PT End of Session - 08/20/20 0919    Visit Number 20    Number of Visits 23    Date for PT Re-Evaluation 08/31/20    Authorization Type [facility]    Authorization Time Period 15 visits approved (06/22/20-12/1920)nancy requested more visits ; Cindy requested her to call [facility] to see if they have been approved or notl    Authorization - Visit Number 12    Authorization - Number of Visits 15    Progress Note Due on Visit 23    PT Start Time 0912    PT Stop Time 0950    PT Time Calculation (min) 38 min    Activity Tolerance Patient tolerated treatment well    Behavior During Therapy WFL for tasks assessed/performed           Past Medical History:  Diagnosis Date  . Hypertension    no meds at this time.  . Reflux   . TIA (transient ischemic attack)    no meds following    Past Surgical History:  Procedure Laterality Date  . ACHILLES TENDON SURGERY Right 02/02/2020   Procedure: Right Achilles tendon debridement and reconstruction;  Surgeon: Wylene Simmer, MD;  Location: [facility];  Service: Orthopedics;  Laterality: Right;  . ELBOW FRACTURE SURGERY      There were no vitals filed for this visit.   Subjective Assessment - 08/20/20 0920    Subjective Patient reports no new issues. Says "same old tightness".    Limitations Lifting;Standing;Walking;House hold activities    How Follmer can you stand comfortably? 5-10 minutes    How Mcree can you walk comfortably? 10-15 minutes    Patient Stated Goals Get back to walking normal    Currently in Pain? Yes    Pain Score 3     Pain Location Calf    Pain Orientation Right    Pain Descriptors / Indicators Tightness     Pain Type Chronic pain    Pain Onset More than a month ago    Pain Frequency Intermittent                             [facility] Adult PT Treatment/Exercise - 08/20/20 0001      Knee/Hip Exercises: Stretches   Gastroc Stretch 3 reps;30 seconds;Both    Gastroc Stretch Limitations slant board 3 x 30"      Knee/Hip Exercises: Standing   Forward Step Up Right;Hand Hold: 0;0 set;15 reps;Step Height: 6"    Step Down Right;1 set;15 reps;Hand Hold: 1;Step Height: 6"    Rocker Board 2 minutes   DF/PF HHA x2    SLS 3 x 15" solid floor each leg   intermittent HHA      Manual Therapy   Manual Therapy Soft tissue mobilization;Myofascial release    Manual therapy comments Manual complete separate than rest of tx    Soft tissue mobilization IASTM to RT distal calf and achilles     Myofascial Release Scar tissue message to RT achilles incision       Ankle Exercises: Standing   Other Standing Ankle Exercises power tower calf raise x 10 both  lv 27, RT claf eccentric lowering x 10 lv 27                   PT Education - 08/20/20 0954    Education Details On purpose of IASTM manual for improved tissue mobility, on self scar tissue message for improved mobility and decreased stiffness.    Person(s) Educated Patient    Methods Explanation    Comprehension Verbalized understanding            PT Short Term Goals - 06/26/20 1042      PT SHORT TERM GOAL #1   Title Patient will be independent with HEP in order to improve functional outcomes.    Time 3    Period Weeks    Status Achieved    Target Date 06/20/20      PT SHORT TERM GOAL #2   Title Patient will report at least 25% improvement in symptoms for improved quality of life.    Time 3    Period Weeks    Status Achieved    Target Date 06/20/20             PT Dingee Term Goals - 08/09/20 1017      PT Deroo TERM GOAL #1   Title Patient will report at least 75% improvement in symptoms for improved quality of  life.    Baseline Currently reports 40%    Time 6    Period Weeks    Status On-going      PT Demarcus TERM GOAL #2   Title Patient will improve FOTO score by at least 10 points in order to indicate improved tolerance to activity.    Baseline Current 10% improvement reported    Time 6    Period Weeks    Status Achieved      PT Bungert TERM GOAL #3   Title Patient will be able to navigate stairs with reciprocal pattern without compensation in order to demonstrate improved LE strength.    Baseline Can ascend reciprocal but descends with step to pattern    Time 6    Period Weeks    Status On-going      PT Duris TERM GOAL #4   Title Patient will be able to ambulate for at least 30 minutes with pain no greater than 1/10 in order to demonstrate improved ability to ambulate in the community.    Baseline Can walk up to 15-20 minutes but with increased pain    Time 6    Period Weeks    Status On-going                 Plan - 08/20/20 0973    Clinical Impression Statement Patient tolerated session fairly well. Shows improving RT ankle DF passively as observed on power tower calf raises with eccentric lowering. Patient was well challenged with this addition. Patient shows improved ability to ambulate s6 inch step but still requires single HHA for stability descending. Added manual IASTM to address complaints of restriction/ pain and stiffness in RT calf area. Patient notes improve mobility and demos improved gait mechanics afterwards.    Personal Factors and Comorbidities Comorbidity 3+;Time since onset of injury/illness/exacerbation;Past/Current Experience;Profession    Comorbidities obesity, hx TIA, HTN    Examination-Activity Limitations Lift;Stand;Stairs;Squat;Locomotion Level;Transfers    Examination-Participation Restrictions Lawyer;Shop;Occupation;Meal Prep    Stability/Clinical Decision Making Stable/Uncomplicated    Rehab Potential Fair    PT Frequency 2x / week    PT  Duration  3 weeks    PT Treatment/Interventions Aquatic Therapy;Biofeedback;Canalith Repostioning;Cryotherapy;Electrical Stimulation;Moist Heat;Traction;Ultrasound;Parrafin;Fluidtherapy;Contrast Bath;DME Instruction;Gait training;Stair training;Functional mobility training;Therapeutic activities;Therapeutic exercise;Balance training;Orthotic Fit/Training;Patient/family education;Neuromuscular re-education;Manual lymph drainage;Manual techniques;Compression bandaging;Scar mobilization;Passive range of motion;Dry needling;Energy conservation;Splinting;Taping;Vasopneumatic Device;Vestibular;Spinal Manipulations;Joint Manipulations;ADLs/Self Care Home Management;Iontophoresis 4mg /ml Dexamethasone    PT Next Visit Plan Assess response to added manual treatment. increase program to returnto level prior  level Continue gastroc strengthening and appropriate gait training.  Progressed ankle ROM/mobility and strengthening as able.  Manual for mobility/pain and scar mobility.    PT Home Exercise Plan 05/30/20 calf stretch; 06/05/20: RTB with printout for 4 directions.; 06/08/20: standing gastroc stretch. 06/13/20: heel/toe raises and plantar fascia stretch; 8/26: seated heel raises, seated toe raises, arch doming, towel crunches; 9/24: vector stace step ups    Consulted and Agree with Plan of Care Patient           Patient will benefit from skilled therapeutic intervention in order to improve the following deficits and impairments:  Abnormal gait, Decreased activity tolerance, Decreased balance, Decreased mobility, Decreased range of motion, Decreased scar mobility, Decreased endurance, Decreased knowledge of precautions, Increased edema, Hypomobility, Difficulty walking, Pain, Increased muscle spasms, Improper body mechanics, Impaired flexibility  Visit Diagnosis: Other abnormalities of gait and mobility  Pain in right ankle and joints of right foot  Other symptoms and signs involving the musculoskeletal  system     Problem List There are no problems to display for this patient.   10:41 AM, 08/20/20 Josue Hector PT DPT  Physical Therapist with [facility]  [phone] [place], [place], [address] Phone: [phone]   Fax:  [phone]  Name: Quinten Allerton MRN: [id number] Date of Birth: 01-16-1976

## 2020-08-23 ENCOUNTER — Encounter (HOSPITAL_COMMUNITY): Payer: Self-pay | Admitting: Physical Therapy

## 2020-08-23 ENCOUNTER — Ambulatory Visit (HOSPITAL_COMMUNITY): Payer: No Typology Code available for payment source | Admitting: Physical Therapy

## 2020-08-23 ENCOUNTER — Other Ambulatory Visit: Payer: Self-pay

## 2020-08-23 DIAGNOSIS — R2689 Other abnormalities of gait and mobility: Secondary | ICD-10-CM | POA: Diagnosis not present

## 2020-08-23 DIAGNOSIS — M25571 Pain in right ankle and joints of right foot: Secondary | ICD-10-CM

## 2020-08-23 DIAGNOSIS — R29898 Other symptoms and signs involving the musculoskeletal system: Secondary | ICD-10-CM

## 2020-08-23 NOTE — Therapy (Signed)
[place], [place], [address] Phone: [phone]   Fax:  [phone]  Physical Therapy Treatment  Patient Details  Name: Mark Anthony MRN: [id number] Date of Birth: 03-26-1976 Referring Provider (PT): Mechele Claude PA-C   Encounter Date: 08/23/2020   PT End of Session - 08/23/20 1606    Visit Number 21    Number of Visits 23    Date for PT Re-Evaluation 08/31/20    Authorization Type [facility]    Authorization Time Period 15 visits approved (06/22/20-12/1920)nancy requested more visits ; Cindy requested her to call [facility] to see if they have been approved or notl    Authorization - Visit Number 13    Authorization - Number of Visits 15    Progress Note Due on Visit 23    PT Start Time 1602    PT Stop Time 1655    PT Time Calculation (min) 53 min    Activity Tolerance Patient tolerated treatment well    Behavior During Therapy WFL for tasks assessed/performed           Past Medical History:  Diagnosis Date  . Hypertension    no meds at this time.  . Reflux   . TIA (transient ischemic attack)    no meds following    Past Surgical History:  Procedure Laterality Date  . ACHILLES TENDON SURGERY Right 02/02/2020   Procedure: Right Achilles tendon debridement and reconstruction;  Surgeon: Wylene Simmer, MD;  Location: [facility];  Service: Orthopedics;  Laterality: Right;  . ELBOW FRACTURE SURGERY      There were no vitals filed for this visit.   Subjective Assessment - 08/23/20 1605    Subjective Patient says he feels like he is doing a little better. Like he is walking more heel to toe. Says added IASTM was "fine".    Limitations Lifting;Standing;Walking;House hold activities    How Bukowski can you stand comfortably? 5-10 minutes    How Bortner can you walk comfortably? 10-15 minutes    Patient Stated Goals Get back to walking normal    Currently in Pain? No/denies    Pain Onset More than a month ago                              [facility] Adult PT Treatment/Exercise - 08/23/20 0001      Knee/Hip Exercises: Stretches   Gastroc Stretch 3 reps;30 seconds;Both    Gastroc Stretch Limitations slant board 3 x 30"      Knee/Hip Exercises: Aerobic   Tread Mill 2 min 1.10mph @ 0% incline; 2 min 1.23mph @ 3% incline       Knee/Hip Exercises: Machines for Strengthening   Other Machine machine walkouts 70# x 10       Knee/Hip Exercises: Standing   Forward Lunges 1 set;10 reps;Both    Forward Lunges Limitations lead leg on 6 in box     Forward Step Up Right;Hand Hold: 1;1 set;15 reps;Step Height: 6"    Step Down Right;1 set;15 reps;Hand Hold: 1;Step Height: 6"    Rocker Board 2 minutes   DF/PF    SLS 3 x 15" solid floor ea; 3 x 10" on black foam  each    Other Standing Knee Exercises grapevines 2RT       Manual Therapy   Manual Therapy Soft tissue mobilization;Myofascial release    Manual therapy comments Manual complete separate  than rest of tx    Soft tissue mobilization IASTM to RT distal calf and achilles     Myofascial Release Scar tissue message to RT achilles incision       Ankle Exercises: Standing   Other Standing Ankle Exercises power tower leg press lv 27 2 x10; DL calf raise x15; RT calf eccentric calf raise x 15                     PT Short Term Goals - 06/26/20 1042      PT SHORT TERM GOAL #1   Title Patient will be independent with HEP in order to improve functional outcomes.    Time 3    Period Weeks    Status Achieved    Target Date 06/20/20      PT SHORT TERM GOAL #2   Title Patient will report at least 25% improvement in symptoms for improved quality of life.    Time 3    Period Weeks    Status Achieved    Target Date 06/20/20             PT Renzulli Term Goals - 08/09/20 1017      PT Wurzer TERM GOAL #1   Title Patient will report at least 75% improvement in symptoms for improved quality of life.    Baseline Currently reports 40%    Time  6    Period Weeks    Status On-going      PT Duke TERM GOAL #2   Title Patient will improve FOTO score by at least 10 points in order to indicate improved tolerance to activity.    Baseline Current 10% improvement reported    Time 6    Period Weeks    Status Achieved      PT Langton TERM GOAL #3   Title Patient will be able to navigate stairs with reciprocal pattern without compensation in order to demonstrate improved LE strength.    Baseline Can ascend reciprocal but descends with step to pattern    Time 6    Period Weeks    Status On-going      PT Sager TERM GOAL #4   Title Patient will be able to ambulate for at least 30 minutes with pain no greater than 1/10 in order to demonstrate improved ability to ambulate in the community.    Baseline Can walk up to 15-20 minutes but with increased pain    Time 6    Period Weeks    Status On-going                 Plan - 08/23/20 1710    Clinical Impression Statement Patient tolerated session well today, was well challenged with ther ex progressions. Patient able to progress single leg balance to foam pad. Patient showing improved heel to toe transition during gait. Added treadmill for gait mechanics and incline walking for improve calf activation through increased ROM. Patient was challenged with added grapevines, and was unable to complete full crossover, but maintained stability well. Patient cued on proper form and mechanics with added exercise. Patient shows improved RT ankle DF AROM post manual.    Personal Factors and Comorbidities Comorbidity 3+;Time since onset of injury/illness/exacerbation;Past/Current Experience;Profession    Comorbidities obesity, hx TIA, HTN    Examination-Activity Limitations Lift;Stand;Stairs;Squat;Locomotion Level;Transfers    Examination-Participation Restrictions Lawyer;Shop;Occupation;Meal Prep    Stability/Clinical Decision Making Stable/Uncomplicated    Rehab Potential Fair    PT  Frequency 2x / week    PT Duration 3 weeks    PT Treatment/Interventions Aquatic Therapy;Biofeedback;Canalith Repostioning;Cryotherapy;Electrical Stimulation;Moist Heat;Traction;Ultrasound;Parrafin;Fluidtherapy;Contrast Bath;DME Instruction;Gait training;Stair training;Functional mobility training;Therapeutic activities;Therapeutic exercise;Balance training;Orthotic Fit/Training;Patient/family education;Neuromuscular re-education;Manual lymph drainage;Manual techniques;Compression bandaging;Scar mobilization;Passive range of motion;Dry needling;Energy conservation;Splinting;Taping;Vasopneumatic Device;Vestibular;Spinal Manipulations;Joint Manipulations;ADLs/Self Care Home Management;Iontophoresis 4mg /ml Dexamethasone    PT Next Visit Plan Continue to progress ankle AROM and functional strenghtening to return to PLOF with ADLs. Add step ups on BOSU. Conintue gait training on treadmill with incline.    PT Home Exercise Plan 05/30/20 calf stretch; 06/05/20: RTB with printout for 4 directions.; 06/08/20: standing gastroc stretch. 06/13/20: heel/toe raises and plantar fascia stretch; 8/26: seated heel raises, seated toe raises, arch doming, towel crunches; 9/24: vector stace step ups    Consulted and Agree with Plan of Care Patient           Patient will benefit from skilled therapeutic intervention in order to improve the following deficits and impairments:  Abnormal gait, Decreased activity tolerance, Decreased balance, Decreased mobility, Decreased range of motion, Decreased scar mobility, Decreased endurance, Decreased knowledge of precautions, Increased edema, Hypomobility, Difficulty walking, Pain, Increased muscle spasms, Improper body mechanics, Impaired flexibility  Visit Diagnosis: Other abnormalities of gait and mobility  Pain in right ankle and joints of right foot  Other symptoms and signs involving the musculoskeletal system     Problem List There are no problems to display for this  patient.  5:17 PM, 08/23/20 Josue Hector PT DPT  Physical Therapist with [facility]  [phone] [place] [address] [place], [place], [address] Phone: [phone]   Fax:  [phone]  Name: Mark Anthony MRN: [id number] Date of Birth: 18-Mar-1976

## 2020-08-28 ENCOUNTER — Telehealth (HOSPITAL_COMMUNITY): Payer: Self-pay | Admitting: Physical Therapy

## 2020-08-28 ENCOUNTER — Ambulatory Visit (HOSPITAL_COMMUNITY): Payer: No Typology Code available for payment source | Admitting: Physical Therapy

## 2020-08-28 NOTE — Telephone Encounter (Signed)
Pt called to cx this appt. message was on vm

## 2020-08-30 ENCOUNTER — Ambulatory Visit (HOSPITAL_COMMUNITY): Payer: No Typology Code available for payment source | Admitting: Physical Therapy

## 2020-09-06 ENCOUNTER — Encounter (HOSPITAL_COMMUNITY): Payer: Self-pay | Admitting: Physical Therapy

## 2020-09-06 ENCOUNTER — Other Ambulatory Visit: Payer: Self-pay

## 2020-09-06 ENCOUNTER — Ambulatory Visit (HOSPITAL_COMMUNITY): Payer: No Typology Code available for payment source | Attending: Student | Admitting: Physical Therapy

## 2020-09-06 DIAGNOSIS — R29898 Other symptoms and signs involving the musculoskeletal system: Secondary | ICD-10-CM | POA: Diagnosis present

## 2020-09-06 DIAGNOSIS — M6281 Muscle weakness (generalized): Secondary | ICD-10-CM | POA: Insufficient documentation

## 2020-09-06 DIAGNOSIS — R2689 Other abnormalities of gait and mobility: Secondary | ICD-10-CM

## 2020-09-06 DIAGNOSIS — M25571 Pain in right ankle and joints of right foot: Secondary | ICD-10-CM | POA: Diagnosis present

## 2020-09-06 NOTE — Therapy (Signed)
[place], [place], [address] Phone: [phone]   Fax:  [phone]  Physical Therapy Treatment/Discharge Summary  Patient Details  Name: Mark Anthony MRN: [id number] Date of Birth: 04/06/1976 Referring Provider (PT): Mechele Claude PA-C   Encounter Date: 09/06/2020  PHYSICAL THERAPY DISCHARGE SUMMARY  Visits from Start of Care: 22  Current functional level related to goals / functional outcomes: See below   Remaining deficits: See below   Education / Equipment: See below  Plan: Patient agrees to discharge.  Patient goals were partially met. Patient is being discharged due to meeting the stated rehab goals.  ?????       PT End of Session - 09/06/20 1314    Visit Number 22    Number of Visits 23    Date for PT Re-Evaluation 09/06/20    Authorization Type [facility]    Authorization Time Period 15 visits approved (06/22/20-12/1920)nancy requested more visits ; Cindy requested her to call [facility] to see if they have been approved or notl    Authorization - Visit Number 14    Authorization - Number of Visits 15    Progress Note Due on Visit 23    PT Start Time 1315    PT Stop Time 1339    PT Time Calculation (min) 24 min    Activity Tolerance Patient tolerated treatment well    Behavior During Therapy WFL for tasks assessed/performed           Past Medical History:  Diagnosis Date  . Hypertension    no meds at this time.  . Reflux   . TIA (transient ischemic attack)    no meds following    Past Surgical History:  Procedure Laterality Date  . ACHILLES TENDON SURGERY Right 02/02/2020   Procedure: Right Achilles tendon debridement and reconstruction;  Surgeon: Wylene Simmer, MD;  Location: [facility];  Service: Orthopedics;  Laterality: Right;  . ELBOW FRACTURE SURGERY      There were no vitals filed for this visit.   Subjective Assessment - 09/06/20 1316    Subjective Patient states he has  been having issues with knees and back. They gave him a knee brace. They said he has knee arthritis. He can now walk for about 45 minutes before he starts having pain in the bottom of his food. Patient states 40-50% improvement. His home exercises are going well.    Limitations Lifting;Standing;Walking;House hold activities    How [place] can you stand comfortably? 5-10 minutes    How Ye[place] can you walk comfortably? 10-15 minutes    Patient Stated Goals Get back to walking normal    Currently in Pain? Yes    Pain Score 4     Pain Location Foot    Pain Orientation Right   plantar   Pain Onset More than a month ago              [facility] PT Assessment - 09/06/20 0001      Assessment   Medical Diagnosis Rupture of R achilles tendon    Referring Provider (PT) Mechele Claude PA-C    Onset Date/Surgical Date 02/02/20    Next MD Visit Dec 20    Prior Therapy no      Precautions   Precautions None      Restrictions   Weight Bearing Restrictions No      Balance Screen   Has the patient fallen in the past 6 months No  Prior Function   Level of Independence Independent    Vocation Full time employment    Vocation Requirements Standing, walking      Cognition   Overall Cognitive Status Within Functional Limits for tasks assessed      Observation/Other Assessments   Focus on Therapeutic Outcomes (FOTO)  43% limited                                   PT Short Term Goals - 06/26/20 1042      PT SHORT TERM GOAL #1   Title Patient will be independent with HEP in order to improve functional outcomes.    Time 3    Period Weeks    Status Achieved    Target Date 06/20/20      PT SHORT TERM GOAL #2   Title Patient will report at least 25% improvement in symptoms for improved quality of life.    Time 3    Period Weeks    Status Achieved    Target Date 06/20/20             PT Riggio Term Goals - 09/06/20 1324      PT Musolino TERM GOAL #1   Title Patient  will report at least 75% improvement in symptoms for improved quality of life.    Baseline Currently reports 40-50%    Time 6    Period Weeks    Status Not Met      PT Hendricksen TERM GOAL #2   Title Patient will improve FOTO score by at least 10 points in order to indicate improved tolerance to activity.    Baseline Current 10% improvement reported    Time 6    Period Weeks    Status Achieved      PT Spires TERM GOAL #3   Title Patient will be able to navigate stairs with reciprocal pattern without compensation in order to demonstrate improved LE strength.    Baseline can navigate with reciprocal pattern but with decreased eccentric control and R ankle DF    Time 6    Period Weeks    Status Not Met      PT Waiters TERM GOAL #4   Title Patient will be able to ambulate for at least 30 minutes with pain no greater than 1/10 in order to demonstrate improved ability to ambulate in the community.    Baseline 30-40 minutes    Time 6    Period Weeks    Status Achieved                 Plan - 09/06/20 1315    Clinical Impression Statement Patient has met 2/2 short term goals and 2/4 Sidor term goals with ability to complete HEP, improved symptoms, increased ambulation time, and activity tolerance. He remains limited by gait pattern, pain, and difficulty with descending stairs leading to remaining goals not being met. Patient able to ambulate with heel toe gait pattern with cueing despite continuing to ambulate with intermittent antalgic gait pattern and compensates with LE externally rotated with push off with medial arch. Patient educated on proper gait mechanics, continuing HEP, gradually progressing walking program. Patient discharged at this time.    Personal Factors and Comorbidities Comorbidity 3+;Time since onset of injury/illness/exacerbation;Past/Current Experience;Profession    Comorbidities obesity, hx TIA, HTN    Examination-Activity Limitations Lift;Stand;Stairs;Squat;Locomotion  Level;Transfers    Examination-Participation Restrictions Lawyer;Shop;Occupation;Meal Prep  Stability/Clinical Decision Making Stable/Uncomplicated    Rehab Potential Fair    PT Frequency 2x / week    PT Duration 3 weeks    PT Treatment/Interventions Aquatic Therapy;Biofeedback;Canalith Repostioning;Cryotherapy;Electrical Stimulation;Moist Heat;Traction;Ultrasound;Parrafin;Fluidtherapy;Contrast Bath;DME Instruction;Gait training;Stair training;Functional mobility training;Therapeutic activities;Therapeutic exercise;Balance training;Orthotic Fit/Training;Patient/family education;Neuromuscular re-education;Manual lymph drainage;Manual techniques;Compression bandaging;Scar mobilization;Passive range of motion;Dry needling;Energy conservation;Splinting;Taping;Vasopneumatic Device;Vestibular;Spinal Manipulations;Joint Manipulations;ADLs/Self Care Home Management;Iontophoresis 66m/ml Dexamethasone    PT Next Visit Plan n/a    PT Home Exercise Plan 05/30/20 calf stretch; 06/05/20: RTB with printout for 4 directions.; 06/08/20: standing gastroc stretch. 06/13/20: heel/toe raises and plantar fascia stretch; 8/26: seated heel raises, seated toe raises, arch doming, towel crunches; 9/24: vector stace step ups    Consulted and Agree with Plan of Care Patient           Patient will benefit from skilled therapeutic intervention in order to improve the following deficits and impairments:  Abnormal gait, Decreased activity tolerance, Decreased balance, Decreased mobility, Decreased range of motion, Decreased scar mobility, Decreased endurance, Decreased knowledge of precautions, Increased edema, Hypomobility, Difficulty walking, Pain, Increased muscle spasms, Improper body mechanics, Impaired flexibility  Visit Diagnosis: Other abnormalities of gait and mobility  Pain in right ankle and joints of right foot  Other symptoms and signs involving the musculoskeletal system  Muscle weakness  (generalized)     Problem List There are no problems to display for this patient.   2:00 PM, 09/06/20 AMearl LatinPT, DPT Physical Therap[place] [facility] [address]Phone: [phone]  Fax:  [phone] Name: Mark KennardMRN: [id number] of Birth: 3April 08, 1977

## 2020-11-23 ENCOUNTER — Other Ambulatory Visit (HOSPITAL_COMMUNITY): Payer: Self-pay | Admitting: Orthopedic Surgery

## 2020-12-06 ENCOUNTER — Encounter (HOSPITAL_BASED_OUTPATIENT_CLINIC_OR_DEPARTMENT_OTHER): Payer: Self-pay | Admitting: Orthopedic Surgery

## 2020-12-06 ENCOUNTER — Other Ambulatory Visit: Payer: Self-pay

## 2020-12-10 ENCOUNTER — Other Ambulatory Visit (HOSPITAL_COMMUNITY)
Admission: RE | Admit: 2020-12-10 | Discharge: 2020-12-10 | Disposition: A | Discharge status: Home / Self Care | Payer: No Typology Code available for payment source | Source: Ambulatory Visit | Attending: Orthopedic Surgery | Admitting: Orthopedic Surgery

## 2020-12-10 DIAGNOSIS — Z01812 Encounter for preprocedural laboratory examination: Secondary | ICD-10-CM | POA: Diagnosis present

## 2020-12-10 DIAGNOSIS — Z20822 Contact with and (suspected) exposure to covid-19: Secondary | ICD-10-CM | POA: Diagnosis not present

## 2020-12-10 LAB — SARS CORONAVIRUS 2 (TAT 6-24 HRS): SARS Coronavirus 2: NEGATIVE

## 2020-12-13 ENCOUNTER — Ambulatory Visit (HOSPITAL_BASED_OUTPATIENT_CLINIC_OR_DEPARTMENT_OTHER): Payer: No Typology Code available for payment source | Admitting: Certified Registered Nurse Anesthetist

## 2020-12-13 ENCOUNTER — Ambulatory Visit (HOSPITAL_BASED_OUTPATIENT_CLINIC_OR_DEPARTMENT_OTHER)
Admission: RE | Admit: 2020-12-13 | Discharge: 2020-12-13 | Disposition: A | Discharge status: Home / Self Care | Payer: No Typology Code available for payment source | Attending: Orthopedic Surgery | Admitting: Orthopedic Surgery

## 2020-12-13 ENCOUNTER — Other Ambulatory Visit: Payer: Self-pay

## 2020-12-13 ENCOUNTER — Encounter (HOSPITAL_BASED_OUTPATIENT_CLINIC_OR_DEPARTMENT_OTHER): Payer: Self-pay | Admitting: Orthopedic Surgery

## 2020-12-13 ENCOUNTER — Encounter (HOSPITAL_BASED_OUTPATIENT_CLINIC_OR_DEPARTMENT_OTHER)
Admission: RE | Disposition: A | Discharge status: Home / Self Care | Payer: Self-pay | Source: Home / Self Care | Attending: Orthopedic Surgery

## 2020-12-13 DIAGNOSIS — Z885 Allergy status to narcotic agent status: Secondary | ICD-10-CM | POA: Insufficient documentation

## 2020-12-13 DIAGNOSIS — M6701 Short Achilles tendon (acquired), right ankle: Secondary | ICD-10-CM | POA: Insufficient documentation

## 2020-12-13 DIAGNOSIS — X58XXXA Exposure to other specified factors, initial encounter: Secondary | ICD-10-CM | POA: Insufficient documentation

## 2020-12-13 DIAGNOSIS — S86011A Strain of right Achilles tendon, initial encounter: Secondary | ICD-10-CM | POA: Insufficient documentation

## 2020-12-13 DIAGNOSIS — F17210 Nicotine dependence, cigarettes, uncomplicated: Secondary | ICD-10-CM | POA: Diagnosis not present

## 2020-12-13 HISTORY — PX: GASTROC RECESSION EXTREMITY: SHX6262

## 2020-12-13 HISTORY — PX: ACHILLES TENDON SURGERY: SHX542

## 2020-12-13 SURGERY — RECESSION, TENDON, GASTROCNEMIUS
Anesthesia: Regional | Site: Leg Lower | Laterality: Right

## 2020-12-13 MED ORDER — RIVAROXABAN 10 MG PO TABS: 10.0000 mg | ORAL_TABLET | Freq: Every day | ORAL | 0 refills | Status: DC

## 2020-12-13 MED ORDER — PROMETHAZINE HCL 25 MG/ML IJ SOLN: 6.2500 mg | INTRAMUSCULAR | Status: DC | PRN

## 2020-12-13 MED ORDER — PHENYLEPHRINE HCL (PRESSORS) 10 MG/ML IV SOLN
INTRAVENOUS | Status: DC | PRN
  Administered 2020-12-13 (×4): 80 ug via INTRAVENOUS

## 2020-12-13 MED ORDER — FENTANYL CITRATE (PF) 100 MCG/2ML IJ SOLN
INTRAMUSCULAR | Status: AC
  Filled 2020-12-13: qty 2

## 2020-12-13 MED ORDER — PHENYLEPHRINE 40 MCG/ML (10ML) SYRINGE FOR IV PUSH (FOR BLOOD PRESSURE SUPPORT)
PREFILLED_SYRINGE | INTRAVENOUS | Status: AC
  Filled 2020-12-13: qty 20

## 2020-12-13 MED ORDER — PROPOFOL 10 MG/ML IV BOLUS
INTRAVENOUS | Status: DC | PRN
  Administered 2020-12-13: 200 mg via INTRAVENOUS

## 2020-12-13 MED ORDER — CEFAZOLIN SODIUM-DEXTROSE 1-4 GM/50ML-% IV SOLN
INTRAVENOUS | Status: AC
  Filled 2020-12-13: qty 50

## 2020-12-13 MED ORDER — ONDANSETRON HCL 4 MG/2ML IJ SOLN
INTRAMUSCULAR | Status: DC | PRN
  Administered 2020-12-13: 4 mg via INTRAVENOUS

## 2020-12-13 MED ORDER — CEFAZOLIN SODIUM-DEXTROSE 2-4 GM/100ML-% IV SOLN
2.0000 g | INTRAVENOUS | Status: AC
  Administered 2020-12-13: 3 g via INTRAVENOUS

## 2020-12-13 MED ORDER — VANCOMYCIN HCL 500 MG IV SOLR
INTRAVENOUS | Status: DC | PRN
  Administered 2020-12-13: 500 mg via TOPICAL

## 2020-12-13 MED ORDER — OXYCODONE HCL 5 MG PO TABS: 5.0000 mg | ORAL_TABLET | Freq: Once | ORAL | Status: DC | PRN

## 2020-12-13 MED ORDER — SUGAMMADEX SODIUM 500 MG/5ML IV SOLN
INTRAVENOUS | Status: DC | PRN
  Administered 2020-12-13: 500 mg via INTRAVENOUS

## 2020-12-13 MED ORDER — CEFAZOLIN SODIUM-DEXTROSE 2-4 GM/100ML-% IV SOLN
INTRAVENOUS | Status: AC
  Filled 2020-12-13: qty 100

## 2020-12-13 MED ORDER — ROCURONIUM BROMIDE 100 MG/10ML IV SOLN
INTRAVENOUS | Status: DC | PRN
  Administered 2020-12-13: 100 mg via INTRAVENOUS

## 2020-12-13 MED ORDER — OXYCODONE HCL 5 MG/5ML PO SOLN: 5.0000 mg | Freq: Once | ORAL | Status: DC | PRN

## 2020-12-13 MED ORDER — SENNA 8.6 MG PO TABS: 2.0000 | ORAL_TABLET | Freq: Two times a day (BID) | ORAL | 0 refills | Status: DC

## 2020-12-13 MED ORDER — HYDROMORPHONE HCL 1 MG/ML IJ SOLN: 0.2500 mg | INTRAMUSCULAR | Status: DC | PRN

## 2020-12-13 MED ORDER — MEPERIDINE HCL 25 MG/ML IJ SOLN: 6.2500 mg | INTRAMUSCULAR | Status: DC | PRN

## 2020-12-13 MED ORDER — OXYCODONE HCL 5 MG PO TABS
5.0000 mg | ORAL_TABLET | Freq: Four times a day (QID) | ORAL | 0 refills | Status: AC | PRN
Start: 2020-12-13 — End: 2020-12-18

## 2020-12-13 MED ORDER — DOCUSATE SODIUM 100 MG PO CAPS: 100.0000 mg | ORAL_CAPSULE | Freq: Two times a day (BID) | ORAL | 0 refills | Status: DC

## 2020-12-13 MED ORDER — FENTANYL CITRATE (PF) 100 MCG/2ML IJ SOLN
100.0000 ug | Freq: Once | INTRAMUSCULAR | Status: AC
  Administered 2020-12-13: 100 ug via INTRAVENOUS

## 2020-12-13 MED ORDER — SODIUM CHLORIDE 0.9 % IV SOLN: INTRAVENOUS | Status: DC

## 2020-12-13 MED ORDER — ROPIVACAINE HCL 5 MG/ML IJ SOLN
INTRAMUSCULAR | Status: DC | PRN
  Administered 2020-12-13: 30 mL via PERINEURAL

## 2020-12-13 MED ORDER — AMISULPRIDE (ANTIEMETIC) 5 MG/2ML IV SOLN: 10.0000 mg | Freq: Once | INTRAVENOUS | Status: DC | PRN

## 2020-12-13 MED ORDER — LACTATED RINGERS IV SOLN: INTRAVENOUS | Status: DC

## 2020-12-13 MED ORDER — MIDAZOLAM HCL 2 MG/2ML IJ SOLN
INTRAMUSCULAR | Status: AC
  Filled 2020-12-13: qty 2

## 2020-12-13 MED ORDER — MIDAZOLAM HCL 2 MG/2ML IJ SOLN
2.0000 mg | Freq: Once | INTRAMUSCULAR | Status: AC
  Administered 2020-12-13: 2 mg via INTRAVENOUS

## 2020-12-13 MED ORDER — PHENYLEPHRINE 40 MCG/ML (10ML) SYRINGE FOR IV PUSH (FOR BLOOD PRESSURE SUPPORT)
PREFILLED_SYRINGE | INTRAVENOUS | Status: AC
  Filled 2020-12-13: qty 10

## 2020-12-13 MED ORDER — LIDOCAINE HCL (CARDIAC) PF 100 MG/5ML IV SOSY
PREFILLED_SYRINGE | INTRAVENOUS | Status: DC | PRN
  Administered 2020-12-13: 30 mg via INTRAVENOUS

## 2020-12-13 MED ORDER — VANCOMYCIN HCL 500 MG IV SOLR
INTRAVENOUS | Status: AC
  Filled 2020-12-13: qty 500

## 2020-12-13 MED ORDER — ONDANSETRON HCL 4 MG PO TABS: 4.0000 mg | ORAL_TABLET | Freq: Every day | ORAL | 0 refills | Status: DC | PRN

## 2020-12-13 MED ORDER — 0.9 % SODIUM CHLORIDE (POUR BTL) OPTIME
TOPICAL | Status: DC | PRN
  Administered 2020-12-13: 200 mL

## 2020-12-13 MED ORDER — DEXAMETHASONE SODIUM PHOSPHATE 4 MG/ML IJ SOLN
INTRAMUSCULAR | Status: DC | PRN
  Administered 2020-12-13: 4 mg via INTRAVENOUS

## 2020-12-13 MED ORDER — FENTANYL CITRATE (PF) 100 MCG/2ML IJ SOLN
INTRAMUSCULAR | Status: DC | PRN
  Administered 2020-12-13: 100 ug via INTRAVENOUS
  Administered 2020-12-13: 50 ug via INTRAVENOUS

## 2020-12-13 SURGICAL SUPPLY — 85 items
APL PRP STRL LF DISP 70% ISPRP (MISCELLANEOUS) ×2
BANDAGE ESMARK 6X9 LF (GAUZE/BANDAGES/DRESSINGS) ×2 IMPLANT
BLADE AVERAGE 25X9 (BLADE) IMPLANT
BLADE MICRO SAGITTAL (BLADE) IMPLANT
BLADE SURG 15 STRL LF DISP TIS (BLADE) ×4 IMPLANT
BLADE SURG 15 STRL SS (BLADE) ×9
BNDG CMPR 9X6 STRL LF SNTH (GAUZE/BANDAGES/DRESSINGS) ×2
BNDG COHESIVE 4X5 TAN STRL (GAUZE/BANDAGES/DRESSINGS) ×3 IMPLANT
BNDG COHESIVE 6X5 TAN STRL LF (GAUZE/BANDAGES/DRESSINGS) ×3 IMPLANT
BNDG ESMARK 6X9 LF (GAUZE/BANDAGES/DRESSINGS) ×3
BOOT STEPPER DURA LG (SOFTGOODS) IMPLANT
BOOT STEPPER DURA MED (SOFTGOODS) IMPLANT
CANISTER SUCT 1200ML W/VALVE (MISCELLANEOUS) ×1 IMPLANT
CHLORAPREP W/TINT 26 (MISCELLANEOUS) ×3 IMPLANT
COVER BACK TABLE 60X90IN (DRAPES) ×3 IMPLANT
COVER WAND RF STERILE (DRAPES) IMPLANT
CUFF TOURN SGL QUICK 34 (TOURNIQUET CUFF) ×3
CUFF TRNQT CYL 34X4.125X (TOURNIQUET CUFF) ×2 IMPLANT
DERMASPAN .5-.9MM 4X4CM SHOU (Miscellaneous) ×1 IMPLANT
DRAPE EXTREMITY T 121X128X90 (DISPOSABLE) ×3 IMPLANT
DRAPE OEC MINIVIEW 54X84 (DRAPES) IMPLANT
DRAPE U-SHAPE 47X51 STRL (DRAPES) ×3 IMPLANT
DRSG MEPITEL 4X7.2 (GAUZE/BANDAGES/DRESSINGS) ×3 IMPLANT
DRSG PAD ABDOMINAL 8X10 ST (GAUZE/BANDAGES/DRESSINGS) ×6 IMPLANT
ELECT REM PT RETURN 9FT ADLT (ELECTROSURGICAL) ×3
ELECTRODE REM PT RTRN 9FT ADLT (ELECTROSURGICAL) ×2 IMPLANT
GAUZE SPONGE 4X4 12PLY STRL (GAUZE/BANDAGES/DRESSINGS) ×3 IMPLANT
GLOVE ECLIPSE 8.0 STRL XLNG CF (GLOVE) ×3 IMPLANT
GLOVE SRG 8 PF TXTR STRL LF DI (GLOVE) ×4 IMPLANT
GLOVE SURG ENC MOIS LTX SZ7 (GLOVE) ×1 IMPLANT
GLOVE SURG ENC MOIS LTX SZ8 (GLOVE) ×3 IMPLANT
GLOVE SURG UNDER POLY LF SZ7 (GLOVE) ×1 IMPLANT
GLOVE SURG UNDER POLY LF SZ8 (GLOVE) ×6
GOWN STRL REUS W/ TWL LRG LVL3 (GOWN DISPOSABLE) ×2 IMPLANT
GOWN STRL REUS W/ TWL XL LVL3 (GOWN DISPOSABLE) ×4 IMPLANT
GOWN STRL REUS W/TWL LRG LVL3 (GOWN DISPOSABLE) ×3
GOWN STRL REUS W/TWL XL LVL3 (GOWN DISPOSABLE) ×9
GRAFT TISS 230-320 GRACILIS (Bone Implant) IMPLANT
KIT BIO-TENODESIS 3X8 DISP (MISCELLANEOUS)
KIT INSRT BABSR STRL DISP BTN (MISCELLANEOUS) IMPLANT
NDL SAFETY ECLIPSE 18X1.5 (NEEDLE) IMPLANT
NDL SUT 6 .5 CRC .975X.05 MAYO (NEEDLE) IMPLANT
NEEDLE HYPO 18GX1.5 SHARP (NEEDLE)
NEEDLE HYPO 22GX1.5 SAFETY (NEEDLE) IMPLANT
NEEDLE MAYO TAPER (NEEDLE)
NS IRRIG 1000ML POUR BTL (IV SOLUTION) ×3 IMPLANT
PACK BASIN DAY SURGERY FS (CUSTOM PROCEDURE TRAY) ×3 IMPLANT
PAD CAST 4YDX4 CTTN HI CHSV (CAST SUPPLIES) ×2 IMPLANT
PADDING CAST COTTON 4X4 STRL (CAST SUPPLIES) ×3
PADDING CAST COTTON 6X4 STRL (CAST SUPPLIES) ×3 IMPLANT
PASSER SUT SWANSON 36MM LOOP (INSTRUMENTS) IMPLANT
PENCIL SMOKE EVACUATOR (MISCELLANEOUS) ×3 IMPLANT
RETRIEVER SUT HEWSON (MISCELLANEOUS) IMPLANT
SANITIZER HAND PURELL 535ML FO (MISCELLANEOUS) ×3 IMPLANT
SHEET MEDIUM DRAPE 40X70 STRL (DRAPES) ×3 IMPLANT
SLEEVE SCD COMPRESS KNEE MED (MISCELLANEOUS) ×3 IMPLANT
SPLINT FAST PLASTER 5X30 (CAST SUPPLIES) ×20
SPLINT PLASTER CAST FAST 5X30 (CAST SUPPLIES) ×40 IMPLANT
SPONGE LAP 18X18 RF (DISPOSABLE) ×3 IMPLANT
STAPLER VISISTAT 35W (STAPLE) IMPLANT
STOCKINETTE 6  STRL (DRAPES) ×3
STOCKINETTE 6 STRL (DRAPES) ×2 IMPLANT
SUCTION FRAZIER HANDLE 10FR (MISCELLANEOUS) ×3
SUCTION TUBE FRAZIER 10FR DISP (MISCELLANEOUS) IMPLANT
SUT ETHIBOND 2 OS 4 DA (SUTURE) IMPLANT
SUT ETHIBOND 3-0 V-5 (SUTURE) IMPLANT
SUT ETHILON 3 0 PS 1 (SUTURE) ×4 IMPLANT
SUT FIBERWIRE #2 38 T-5 BLUE (SUTURE)
SUT MNCRL AB 3-0 PS2 18 (SUTURE) ×5 IMPLANT
SUT VIC AB 0 CT1 27 (SUTURE)
SUT VIC AB 0 CT1 27XBRD ANBCTR (SUTURE) IMPLANT
SUT VIC AB 0 SH 27 (SUTURE) ×2 IMPLANT
SUT VIC AB 1 CT1 27 (SUTURE) ×6
SUT VIC AB 1 CT1 27XBRD ANBCTR (SUTURE) IMPLANT
SUT VIC AB 2-0 SH 18 (SUTURE) IMPLANT
SUT VIC AB 2-0 SH 27 (SUTURE)
SUT VIC AB 2-0 SH 27XBRD (SUTURE) IMPLANT
SUTURE FIBERWR #2 38 T-5 BLUE (SUTURE) IMPLANT
SYR BULB EAR ULCER 3OZ GRN STR (SYRINGE) ×3 IMPLANT
TENDON GRACILIS FROZEN (Bone Implant) ×3 IMPLANT
TENDON GRACILIS FROZEN 230-320 (Bone Implant) ×2 IMPLANT
TOWEL GREEN STERILE FF (TOWEL DISPOSABLE) ×6 IMPLANT
TUBE CONNECTING 20X1/4 (TUBING) ×1 IMPLANT
UNDERPAD 30X36 HEAVY ABSORB (UNDERPADS AND DIAPERS) ×3 IMPLANT
YANKAUER SUCT BULB TIP NO VENT (SUCTIONS) ×1 IMPLANT

## 2020-12-13 NOTE — Anesthesia Postprocedure Evaluation (Signed)
Anesthesia Post Note  Patient: [name]  Procedure(s) Performed: Gastroc Recession (Right Leg Lower) Right Achilles Tendon Reconstruction (Right Foot)     Patient location during evaluation: PACU Anesthesia Type: General Level of consciousness: awake and alert Pain management: pain level controlled Vital Signs Assessment: post-procedure vital signs reviewed and stable Respiratory status: spontaneous breathing, nonlabored ventilation and respiratory function stable Cardiovascular status: blood pressure returned to baseline and stable Postop Assessment: no apparent nausea or vomiting Anesthetic complications: no   No complications documented.  Last Vitals:  Vitals:   12/13/20 1345 12/13/20 1412  BP: (!) 145/91 (!) 151/98  Pulse: 81 84  Resp: 19 16  Temp:  36.8 C  SpO2: 95% 97%    Last Pain:  Vitals:   12/13/20 1412  TempSrc:   PainSc: 0-No pain                 Audry Pili

## 2020-12-13 NOTE — Progress Notes (Signed)
Assisted Dr. Sabra Heck with right, ultrasound guided, popliteal block. Side rails up, monitors on throughout procedure. See vital signs in flow sheet. Tolerated Procedure well.

## 2020-12-13 NOTE — H&P (Signed)
Mark Anthony is an 45 y.o. male.   Chief Complaint: Right ankle pain HPI: The patient is a 45 year old male with a past medical history significant for smoking.  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  In the postoperative period he experienced a pop at the posterior ankle.  He struggled with making progress in therapy.  A repeat MRI revealed a recurrent rupture at the site of his repair.  He presents now for revision of his Achilles tendon reconstruction with flexor houses longus transfer to the calcaneus.  Past Medical History:  Diagnosis Date  . Hypertension    no meds at this time.  . Reflux   . TIA (transient ischemic attack)    no meds following    Past Surgical History:  Procedure Laterality Date  . ACHILLES TENDON SURGERY Right 02/02/2020   Procedure: Right Achilles tendon debridement and reconstruction;  Surgeon: Wylene Simmer, MD;  Location: [facility];  Service: Orthopedics;  Laterality: Right;  . ELBOW FRACTURE SURGERY      History reviewed. No pertinent family history. Social History:  reports that he has been smoking cigarettes. He has been smoking about 0.25 packs per day. He has never used smokeless tobacco. He reports current alcohol use. He reports that he does not use drugs.  Allergies:  Allergies  Allergen Reactions  . Vicodin [Hydrocodone-Acetaminophen] Nausea And Vomiting    No medications prior to admission.    No results found for this or any previous visit (from the past 48 hour(s)). No results found.  Review of Systems no recent fever, chills, nausea, vomiting or changes in his appetite  Blood pressure 132/83, pulse 79, resp. rate (!) 23, height 6\' 1"  (1.854 m), weight 130.4 kg, SpO2 100 %. Physical Exam  Well-nourished well-developed man in no apparent distress.  Alert and oriented x4.  Normal mood and affect.  Gait is antalgic to the right.  4-5 strength in plantarflexion.  Surgical incision is  healed.  No signs of infection.  There is a palpable defect in the tendon at the posterior ankle.   Assessment/Plan Right acute on chronic Achilles tendon rupture -to the operating room today for Achilles tendon reconstruction and flexor houses longus transfer to the calcaneus.  The risks and benefits of the alternative treatment options have been discussed in detail.  The patient wishes to proceed with surgery and specifically understands risks of bleeding, infection, nerve damage, blood clots, need for additional surgery, amputation and death.   Wylene Simmer, MD December 31, 2020, 11:10 AM

## 2020-12-13 NOTE — Anesthesia Preprocedure Evaluation (Signed)
Anesthesia Evaluation  Patient identified by MRN, date of birth, ID band Patient awake    Reviewed: Allergy & Precautions, NPO status , Patient's Chart, lab work & pertinent test results  Airway Mallampati: III  TM Distance: >3 FB Neck ROM: Full    Dental no notable dental hx. (+) Teeth Intact, Dental Advisory Given   Pulmonary Current Smoker and Patient abstained from smoking.,  <1ppd x 15 years    Pulmonary exam normal breath sounds clear to auscultation       Cardiovascular hypertension, Normal cardiovascular exam Rhythm:Regular Rate:Normal  HTN- no meds   Neuro/Psych TIAnegative psych ROS   GI/Hepatic Neg liver ROS, GERD  Controlled,  Endo/Other  Obesity BMI 37  Renal/GU negative Renal ROS  negative genitourinary   Musculoskeletal Acquired short right achilles tendon   Abdominal (+) + obese,   Peds negative pediatric ROS (+)  Hematology negative hematology ROS (+)   Anesthesia Other Findings   Reproductive/Obstetrics negative OB ROS                             Anesthesia Physical  Anesthesia Plan  ASA: III  Anesthesia Plan: General and Regional   Post-op Pain Management: GA combined w/ Regional for post-op pain   Induction: Intravenous  PONV Risk Score and Plan: 1 and Ondansetron and Treatment may vary due to age or medical condition  Airway Management Planned: Oral ETT  Additional Equipment: None  Intra-op Plan:   Post-operative Plan: Extubation in OR  Informed Consent: I have reviewed the patients History and Physical, chart, labs and discussed the procedure including the risks, benefits and alternatives for the proposed anesthesia with the patient or authorized representative who has indicated his/her understanding and acceptance.     Dental advisory given  Plan Discussed with: CRNA  Anesthesia Plan Comments:         Anesthesia Quick Evaluation

## 2020-12-13 NOTE — Transfer of Care (Signed)
Immediate Anesthesia Transfer of Care Note  Patient: Mark Anthony  Procedure(s) Performed: Milus Height Recession (Right Leg Lower) Right Achilles Tendon Reconstruction (Right Foot)  Patient Location: PACU  Anesthesia Type:GA combined with regional for post-op pain  Level of Consciousness: awake, alert  and oriented  Airway & Oxygen Therapy: Patient Spontanous Breathing and Patient connected to face mask oxygen  Post-op Assessment: Report given to RN and Post -op Vital signs reviewed and stable  Post vital signs: Reviewed and stable  Last Vitals:  Vitals Value Taken Time  BP    Temp    Pulse    Resp    SpO2      Last Pain:  Vitals:   12/13/20 1005  TempSrc: Oral  PainSc: 0-No pain      Patients Stated Pain Goal: 4 (67/59/16 3846)  Complications: No complications documented.

## 2020-12-13 NOTE — Discharge Instructions (Addendum)
Wylene Simmer, MD [facility]  Please read the following information regarding your care after surgery.  Medications  You only need a prescription for the narcotic pain medicine (ex. oxycodone, Percocet, Norco).  All of the other medicines listed below are available over the counter. X Aleve 2 pills twice a day for the first 3 days after surgery. X acetominophen (Tylenol) 650 mg every 4-6 hours as you need for minor to moderate pain X oxycodone as prescribed for severe pain X zofran as prescribed for nausea  Narcotic pain medicine (ex. oxycodone, Percocet, Vicodin) will cause constipation.  To prevent this problem, take the following medicines while you are taking any pain medicine. X docusate sodium (Colace) 100 mg twice a day X senna (Senokot) 2 tablets twice a day  X To help prevent blood clots, take Xarelto as prescribed for two weeks after surgery.  You should also get up every hour while you are awake to move around.    Weight Bearing X Do not bear any weight on the operated leg or foot.  Cast / Splint / Dressing X Keep your splint, cast or dressing clean and dry.  Dont put anything (coat hanger, pencil, etc) down inside of it.  If it gets damp, use a hair dryer on the cool setting to dry it.  If it gets soaked, call the office to schedule an appointment for a cast change.   After your dressing, cast or splint is removed; you may shower, but do not soak or scrub the wound.  Allow the water to run over it, and then gently pat it dry.  Swelling It is normal for you to have swelling where you had surgery.  To reduce swelling and pain, keep your toes above your nose for at least 3 days after surgery.  It may be necessary to keep your foot or leg elevated for several weeks.  If it hurts, it should be elevated.  Follow Up Call my office at [phone] when you are discharged from the hospital or surgery center to schedule an appointment to be seen two weeks after surgery.  Call my  office at [phone] if you develop a fever >101.5 F, nausea, vomiting, bleeding from the surgical site or severe pain.     Post Anesthesia Home Care Instructions  Activity: Get plenty of rest for the remainder of the day. A responsible individual must stay with you for 24 hours following the procedure.  For the next 24 hours, DO NOT: -Drive a car -Paediatric nurse -Drink alcoholic beverages -Take any medication unless instructed by your physician -Make any legal decisions or sign important papers.  Meals: Start with liquid foods such as gelatin or soup. Progress to regular foods as tolerated. Avoid greasy, spicy, heavy foods. If nausea and/or vomiting occur, drink only clear liquids until the nausea and/or vomiting subsides. Call your physician if vomiting continues.  Special Instructions/Symptoms: Your throat may feel dry or sore from the anesthesia or the breathing tube placed in your throat during surgery. If this causes discomfort, gargle with warm salt water. The discomfort should disappear within 24 hours.  If you had a scopolamine patch placed behind your ear for the management of post- operative nausea and/or vomiting:  1. The medication in the patch is effective for 72 hours, after which it should be removed.  Wrap patch in a tissue and discard in the trash. Wash hands thoroughly with soap and water. 2. You may remove the patch earlier than 72 hours if you  experience unpleasant side effects which may include dry mouth, dizziness or visual disturbances. 3. Avoid touching the patch. Wash your hands with soap and water after contact with the patch.    Regional Anesthesia Blocks  1. Numbness or the inability to move the "blocked" extremity may last from 3-48 hours after placement. The length of time depends on the medication injected and your individual response to the medication. If the numbness is not going away after 48 hours, call your surgeon.  2. The extremity that is  blocked will need to be protected until the numbness is gone and the  Strength has returned. Because you cannot feel it, you will need to take extra care to avoid injury. Because it may be weak, you may have difficulty moving it or using it. You may not know what position it is in without looking at it while the block is in effect.  3. For blocks in the legs and feet, returning to weight bearing and walking needs to be done carefully. You will need to wait until the numbness is entirely gone and the strength has returned. You should be able to move your leg and foot normally before you try and bear weight or walk. You will need someone to be with you when you first try to ensure you do not fall and possibly risk injury.  4. Bruising and tenderness at the needle site are common side effects and will resolve in a few days.  5. Persistent numbness or new problems with movement should be communicated to the surgeon or the [facility] [phone] [place] [phone]).

## 2020-12-13 NOTE — Op Note (Signed)
12/13/2020  1:23 PM  PATIENT:  Mark Anthony  45 y.o. male  PRE-OPERATIVE DIAGNOSIS: 1.  Chronic right Achilles tendon rupture 2.  Short right Achilles tendon  POST-OPERATIVE DIAGNOSIS: Same  Procedure(s): 1.  Gastroc Recession 2.  Right Achilles Tendon Reconstruction with gracilis allograft and derma span tendon augmentation  SURGEON:  Wylene Simmer, MD  ASSISTANT: Mechele Claude, PA-C  ANESTHESIA:   General, regional  EBL:  minimal   TOURNIQUET:   Total Tourniquet Time Documented: Thigh (Right) - 89 minutes Total: Thigh (Right) - 89 minutes  COMPLICATIONS:  None apparent  DISPOSITION:  Extubated, awake and stable to recovery.  INDICATION FOR PROCEDURE: The patient is a 45 year old male with a past medical history significant for smoking.  He sustained a right Achilles tendon rupture many months before seeing me last year.  He underwent repair of his chronic Achilles tendon rupture back in April 2021.  In the postoperative period he reinjured the ankle.  An MRI reveals a rupture through the repair site.  He presents now for revision reconstruction of his Achilles tendon.  The risks and benefits of the alternative treatment options have been discussed in detail.  The patient wishes to proceed with surgery and specifically understands risks of bleeding, infection, nerve damage, blood clots, need for additional surgery, amputation and death.  PROCEDURE IN DETAIL: After preoperative consent was obtained and the correct operative site was identified, the patient was brought the operating room supine on a stretcher.  General anesthesia was administered.  Preoperative antibiotics were administered.  Surgical timeout was taken.  The right lower extremity was exsanguinated and a thigh tourniquet inflated to 300 mmHg.  The patient was then turned into the prone position on the operating table with all bony prominences padded well.  The right lower extremity was prepped and draped in standard sterile  fashion.  The previous posterior ankle surgical incision was identified.  It was opened again sharply and dissection was carried down sharply through the subcutaneous tissues to the level of the peritenon.  This created full-thickness flaps medially and laterally.  The tendon rupture was identified.  Both tendon ends were mobilized from adjacent scar tissue.  The flexor houses longus muscle was identified.  It was traced into the tarsal tunnel taking care to protect the neurovascular bundle medially.  There was no evident motion at the hallux with traction on the FHL tendon.  In fact traction on the FHL tendon created ankle plantarflexion.  There was a slip of tendon that attached directly into the calcaneus.  The remainder of the tendon did not have any excursion within the tarsal tunnel.  The decision was made to abort the FHL transfer since functionally his FHL plantarflex the ankle already.  Attention was turned to the proximal calf.  An incision was made just distal to the gastrocnemius and sharp dissection was carried down through the subcutaneous tissues taking care to protect the lesser saphenous vein and sural nerve.  The gastrocnemius tendon was identified.  Was divided under direct vision in its entirety.  This allowed more distal excursion of the proximal stump at the distal incision.  The tendons could not be opposed after debridement to healthy appearing tissue.  The decision was made to use allograft tendon to span this gap.  A gracilis allograft was thawed.  It was woven through the tendon ends twice on each side creating for strands of tendon across the gap.  It was sewn in with 0 and #1 Vicryl.  The  deep fascial layer was then reflected around the tendon repair site and sewn in medially and laterally on the distal stump.  A 4 cm x 4 cm piece of dermis plan porcine allograft was placed around the superficial aspect of the tendon and sewn in proximally and distally with 3-0 Monocryl.  The wound was  irrigated copiously.  Subcutaneous tissues and peritenon were approximated over the tendon repair site with inverted simple sutures of 3-0 Monocryl.  Skin incision was closed with running 3-0 nylon.  Proximal incision was closed in the same fashion.  Both incisions were sprinkled with vancomycin powder prior to closure.  Sterile dressings were applied followed by a well-padded short leg splint with the ankle in maximal plantarflexion.  Tourniquet was released after application of the dressings.  The patient was awakened from anesthesia and transported to the recovery room in stable condition.   FOLLOW UP PLAN: Nonweightbearing on the right lower extremity in a short leg splint in maximal plantarflexion for the next 2 weeks.  Follow-up in the office for suture removal and conversion to a short leg cast still in plantarflexion.  Plan 6 weeks immobilization nonweightbearing.  Xarelto for DVT prophylaxis since he is a smoker.    Mechele Claude PA-C was present and scrubbed for the duration of the operative case. His assistance was essential in positioning the patient, prepping and draping, gaining and maintaining exposure, performing the operation, closing and dressing the wounds and applying the splint.

## 2020-12-13 NOTE — Anesthesia Procedure Notes (Signed)
Anesthesia Regional Block: Popliteal block   Pre-Anesthetic Checklist: ,, timeout performed, Correct Patient, Correct Site, Correct Laterality, Correct Procedure, Correct Position, site marked, Risks and benefits discussed,  Surgical consent,  Pre-op evaluation,  At surgeon's request and post-op pain management  Laterality: Right  Prep: chloraprep       Needles:  Injection technique: Single-shot  Needle Type: Stimiplex     Needle Length: 9cm  Needle Gauge: 21     Additional Needles:   Procedures:,,,, ultrasound used (permanent image in chart),,,,  Narrative:  Start time: 12/13/2020 10:58 AM End time: 12/13/2020 11:03 AM Injection made incrementally with aspirations every 5 mL.  Performed by: Personally  Anesthesiologist: Lynda Rainwater, MD

## 2020-12-13 NOTE — Anesthesia Procedure Notes (Signed)
Procedure Name: Intubation Date/Time: 12/13/2020 11:52 AM Performed by: Signe Colt, CRNA Pre-anesthesia Checklist: Patient identified, Emergency Drugs available, Suction available and Patient being monitored Patient Re-evaluated:Patient Re-evaluated prior to induction Oxygen Delivery Method: Circle system utilized Preoxygenation: Pre-oxygenation with 100% oxygen Induction Type: IV induction Ventilation: Mask ventilation without difficulty Laryngoscope Size: Mac, 4 and Glidescope Grade View: Grade IV Tube type: Oral Tube size: 7.5 mm Number of attempts: 3 Airway Equipment and Method: Stylet and Oral airway Placement Confirmation: ETT inserted through vocal cords under direct vision,  positive ETCO2 and breath sounds checked- equal and bilateral Secured at: 22 cm Tube secured with: Tape Dental Injury: Teeth and Oropharynx as per pre-operative assessment  Difficulty Due To: Difficulty was anticipated Comments: Expected difficulty, DL x ! With MAC 4 unable to visualize VC, Dr Sabra Heck DLx1 also unable to visualize VC, Glide scope VC visualized easy placement of 7.5 ETT, + ETCO2, teeth unchanged

## 2020-12-17 ENCOUNTER — Encounter (HOSPITAL_BASED_OUTPATIENT_CLINIC_OR_DEPARTMENT_OTHER): Payer: Self-pay | Admitting: Orthopedic Surgery

## 2021-04-09 ENCOUNTER — Ambulatory Visit (HOSPITAL_COMMUNITY): Payer: No Typology Code available for payment source | Attending: Orthopedic Surgery | Admitting: Physical Therapy

## 2021-04-09 ENCOUNTER — Encounter (HOSPITAL_COMMUNITY): Payer: Self-pay | Admitting: Physical Therapy

## 2021-04-09 ENCOUNTER — Other Ambulatory Visit: Payer: Self-pay

## 2021-04-09 DIAGNOSIS — R29898 Other symptoms and signs involving the musculoskeletal system: Secondary | ICD-10-CM | POA: Diagnosis present

## 2021-04-09 DIAGNOSIS — M6281 Muscle weakness (generalized): Secondary | ICD-10-CM | POA: Diagnosis present

## 2021-04-09 DIAGNOSIS — R2689 Other abnormalities of gait and mobility: Secondary | ICD-10-CM

## 2021-04-09 DIAGNOSIS — M25571 Pain in right ankle and joints of right foot: Secondary | ICD-10-CM

## 2021-04-09 DIAGNOSIS — M25671 Stiffness of right ankle, not elsewhere classified: Secondary | ICD-10-CM | POA: Insufficient documentation

## 2021-04-09 NOTE — Therapy (Signed)
[facility] [place], [place], [address] Phone: [phone]   Fax:  [phone]  Physical Therapy Evaluation  Patient Details  Name: Mark Anthony MRN: [id number] Date of Birth: 1976-01-12 Referring Provider (PT): Mechele Claude PA-C   Encounter Date: 04/09/2021   PT End of Session - 04/09/21 1613     Visit Number 1    Number of Visits 12    Date for PT Re-Evaluation 05/21/21    Authorization Type [facility] (15 visits approved)    Authorization - Visit Number 1    Authorization - Number of Visits 15    PT Start Time 1027    PT Stop Time 1612    PT Time Calculation (min) 39 min    Activity Tolerance Patient tolerated treatment well    Behavior During Therapy [facility] for tasks assessed/performed             Past Medical History:  Diagnosis Date   Hypertension    no meds at this time.   Reflux    TIA (transient ischemic attack)    no meds following    Past Surgical History:  Procedure Laterality Date   ACHILLES TENDON SURGERY Right 02/02/2020   Procedure: Right Achilles tendon debridement and reconstruction;  Surgeon: Wylene Simmer, MD;  Location: [facility];  Service: Orthopedics;  Laterality: Right;   ACHILLES TENDON SURGERY Right 12/13/2020   Procedure: Right Achilles Tendon Reconstruction;  Surgeon: Wylene Simmer, MD;  Location: [facility];  Service: Orthopedics;  Laterality: Right;   ELBOW FRACTURE SURGERY     GASTROC RECESSION EXTREMITY Right 12/13/2020   Procedure: Gastroc Recession;  Surgeon: Wylene Simmer, MD;  Location: [facility];  Service: Orthopedics;  Laterality: Right;    There were no vitals filed for this visit.    Subjective Assessment - 04/09/21 1534     Subjective Patient is a 45 y.o. male who presents to physical therapy s/p rupture of R Achilles tendon and 2nd repair of tendon on 12/13/20. Patient continues to have pain in swelling in his heel. His heel doesn't  feel quite right. He has been working on some of his old exercises. He has been using the band and going up on his toes. He is having trouble with walking, stairs, transfers. He does feel better compared to the first time. His main goal is to try to get back to normal. Walking on concrete kills his foot.    Limitations Standing;Walking;House hold activities    How [place] can you walk comfortably? 100 feet    Patient Stated Goals get ankle back to normal    Currently in Pain? Yes    Pain Score 4     Pain Location Heel   heel and arch   Pain Orientation Right    Pain Descriptors / Indicators Dull;Tingling    Pain Type Chronic pain    Pain Onset More than a month ago    Pain Frequency Constant                [facility] PT Assessment - 04/09/21 0001       Assessment   Medical Diagnosis Rupture of R achilles tendon/repair    Referring Provider (PT) Mechele Claude PA-C    Next MD Visit July 25th    Prior Therapy yes      Precautions   Precautions None      Restrictions   Weight Bearing Restrictions No  Balance Screen   Has the patient fallen in the past 6 months No    Has the patient had a decrease in activity level because of a fear of falling?  No    Is the patient reluctant to leave their home because of a fear of falling?  No      Prior Function   Level of Independence Independent      Cognition   Overall Cognitive Status Within Functional Limits for tasks assessed      Observation/Other Assessments   Observations ambulates with antalgic gait, decreased DF on R with foot externally rotated    Focus on Therapeutic Outcomes (FOTO)  34% function      ROM / Strength   AROM / PROM / Strength AROM;Strength      AROM   AROM Assessment Site Ankle    Right/Left Ankle Right;Left    Right Ankle Dorsiflexion 0    Right Ankle Plantar Flexion 45    Right Ankle Inversion 21    Right Ankle Eversion 12    Left Ankle Dorsiflexion 5    Left Ankle Plantar Flexion 40    Left Ankle  Inversion 35    Left Ankle Eversion 35      Strength   Overall Strength Comments decreased ROM R ankle    Strength Assessment Site Knee;Ankle    Right/Left Knee Right;Left    Right Knee Flexion 4+/5    Right Knee Extension 4+/5    Left Knee Flexion 5/5    Left Knee Extension 5/5    Right/Left Ankle Right;Left    Right Ankle Dorsiflexion 5/5    Right Ankle Inversion 5/5    Right Ankle Eversion 5/5    Left Ankle Dorsiflexion 5/5    Left Ankle Inversion 5/5    Left Ankle Eversion 5/5      Palpation   Palpation comment slight TTP in R calf      Special Tests   Other special tests able to complete HR with increased PF ROM on R>L      Transfers   Five time sit to stand comments  18.93 without UE support      Ambulation/Gait   Ambulation/Gait Yes    Ambulation Distance (Feet) 350 Feet    Assistive device None    Gait Pattern Antalgic    Gait Comments 2MWT, antalgic gait with foot externally rotated, stairs - completes with alternaing pattern, slow, labored, turned to R                        Objective measurements completed on examination: See above findings.               PT Education - 04/09/21 1533     Education Details Patient educated on exam findings, POC, scope of PT, HEP    Person(s) Educated Patient    Methods Explanation;Demonstration    Comprehension Verbalized understanding;Returned demonstration              PT Short Term Goals - 04/09/21 1618       PT SHORT TERM GOAL #1   Title Patient will be independent with HEP in order to improve functional outcomes.    Time 3    Period Weeks    Status New    Target Date 04/30/21      PT SHORT TERM GOAL #2   Title Patient will report at least 25% improvement in symptoms for improved quality of life.  Time 3    Period Weeks    Status New    Target Date 04/30/21               PT [place] Term Goals - 04/09/21 1618       PT [place] TERM GOAL #1   Title Patient will report at  least 75% improvement in symptoms for improved quality of life.    Time 6    Period Weeks    Status New    Target Date 05/21/21      PT [place] TERM GOAL #2   Title Patient will improve FOTO score by at least 15 points in order to indicate improved tolerance to activity.    Time 6    Period Weeks    Status New    Target Date 05/21/21      PT [place] TERM GOAL #3   Title Patient will be able to navigate stairs with reciprocal pattern without compensation in order to demonstrate improved LE strength.    Baseline can navigate with reciprocal pattern but with decreased eccentric control and R ankle DF    Time 5    Period Weeks    Status New    Target Date 05/21/21      PT [place] TERM GOAL #4   Title Patient will be able to ambulate for at least 30 minutes with pain no greater than 1/10 in order to demonstrate improved ability to ambulate in the community.    Time 6    Period Weeks    Status New    Target Date 05/21/21      PT [place] TERM GOAL #5   Title Patient will be able to ambulate at least 425 feet in 2MWT in order to demonstrate improved gait speed for community ambulation.    Time 6    Period Weeks    Status New    Target Date 05/21/21                    Plan - 04/09/21 1614     Clinical Impression Statement Patient is a 45 y.o. male who presents to physical therapy s/p rupture of R Achilles tendon and 2nd repair of tendon on 12/13/20. He presents with pain limited deficits in R ankle strength, ROM, gait, balance, functional mobility with ADL. He is having to modify and restrict ADL as indicated by FOTO score as well as subjective information and objective measures which is affecting overall participation. Patient will benefit from skilled physical therapy in order to improve function and reduce impairment.    Personal Factors and Comorbidities Behavior Pattern;Social Background;Comorbidity 3+;Fitness;Past/Current Experience;Time since onset of injury/illness/exacerbation     Comorbidities BMI over 30, High Blood Pressure, Prior Surgery, hx Stroke or TIA    Examination-Activity Limitations Squat;Stairs;Stand;Locomotion Level;Lift;Bend;Transfers    Examination-Participation Restrictions Cleaning;Community Activity;Occupation;Shop;Volunteer;Yard Work    Stability/Clinical Decision Making Stable/Uncomplicated    Designer, jewellery Low    Rehab Potential Fair    PT Frequency 2x / week    PT Duration 6 weeks    PT Treatment/Interventions ADLs/Self Care Home Management;Cryotherapy;Aquatic Therapy;Electrical Stimulation;Iontophoresis 4mg /ml Dexamethasone;Moist Heat;Traction;Ultrasound;Parrafin;DME Instruction;Gait training;Stair training;Functional mobility training;Therapeutic activities;Therapeutic exercise;Balance training;Neuromuscular re-education;Patient/family education;Orthotic Fit/Training;Manual techniques;Scar mobilization;Compression bandaging;Passive range of motion;Dry needling;Energy conservation;Splinting;Taping;Spinal Manipulations;Joint Manipulations    PT Next Visit Plan band 4 way, HR    PT Home Exercise Plan begin functional strengthening with stairs, STS; PF strength, R ankle ROM gentle    Consulted and Agree with Plan of Care  Patient             Patient will benefit from skilled therapeutic intervention in order to improve the following deficits and impairments:  Abnormal gait, Decreased endurance, Hypomobility, Decreased activity tolerance, Decreased strength, Pain, Decreased balance, Decreased mobility, Difficulty walking, Decreased range of motion, Impaired perceived functional ability, Impaired flexibility  Visit Diagnosis: Pain in right ankle and joints of right foot  Other abnormalities of gait and mobility  Other symptoms and signs involving the musculoskeletal system  Muscle weakness (generalized)  Stiffness of right ankle, not elsewhere classified     Problem List There are no problems to display for this  patient.   4:21 PM, 04/09/21 Mearl Latin PT, DPT Physical Therapist at [facility] [address] [place], [place], [address] Phone: [phone]   Fax:  [phone]  Name: Milas Schappell MRN: [id number] Date of Birth: December 01, 1975

## 2021-04-15 ENCOUNTER — Ambulatory Visit (HOSPITAL_COMMUNITY): Payer: No Typology Code available for payment source | Admitting: Physical Therapy

## 2021-04-17 ENCOUNTER — Encounter (HOSPITAL_COMMUNITY): Payer: Non-veteran care | Admitting: Physical Therapy

## 2021-04-25 ENCOUNTER — Ambulatory Visit (HOSPITAL_COMMUNITY): Payer: No Typology Code available for payment source

## 2021-04-25 ENCOUNTER — Telehealth (HOSPITAL_COMMUNITY): Payer: Self-pay

## 2021-04-25 NOTE — Telephone Encounter (Signed)
Patient called to cx due to trying to get everything straight - should be ok for next week.

## 2021-05-01 ENCOUNTER — Ambulatory Visit (HOSPITAL_COMMUNITY): Payer: No Typology Code available for payment source | Attending: Orthopedic Surgery | Admitting: Physical Therapy

## 2021-05-01 ENCOUNTER — Encounter (HOSPITAL_COMMUNITY): Payer: Self-pay | Admitting: Physical Therapy

## 2021-05-01 ENCOUNTER — Other Ambulatory Visit: Payer: Self-pay

## 2021-05-01 DIAGNOSIS — R29898 Other symptoms and signs involving the musculoskeletal system: Secondary | ICD-10-CM | POA: Diagnosis present

## 2021-05-01 DIAGNOSIS — M25571 Pain in right ankle and joints of right foot: Secondary | ICD-10-CM | POA: Diagnosis present

## 2021-05-01 DIAGNOSIS — M25671 Stiffness of right ankle, not elsewhere classified: Secondary | ICD-10-CM | POA: Insufficient documentation

## 2021-05-01 DIAGNOSIS — M6281 Muscle weakness (generalized): Secondary | ICD-10-CM | POA: Diagnosis present

## 2021-05-01 DIAGNOSIS — R2689 Other abnormalities of gait and mobility: Secondary | ICD-10-CM | POA: Insufficient documentation

## 2021-05-01 NOTE — Therapy (Signed)
[place], [place], [address] Phone: [phone]   Fax:  [phone]  Physical Therapy Treatment  Patient Details  Name: Mark Anthony MRN: [id number] Date of Birth: [birth year] Referring Provider (PT): Mechele Claude PA-C   Encounter Date: 05/01/2021   PT End of Session - 05/01/21 1415     Visit Number 2    Number of Visits 12    Date for PT Re-Evaluation 05/21/21    Authorization Type [facility] (15 visits approved)    Authorization - Visit Number 2    Authorization - Number of Visits 15    PT Start Time 1405   arrived late   PT Stop Time 1440    PT Time Calculation (min) 35 min    Activity Tolerance Patient tolerated treatment well    Behavior During Therapy [facility] for tasks assessed/performed             Past Medical History:  Diagnosis Date   Hypertension    no meds at this time.   Reflux    TIA (transient ischemic attack)    no meds following    Past Surgical History:  Procedure Laterality Date   ACHILLES TENDON SURGERY Right 02/02/2020   Procedure: Right Achilles tendon debridement and reconstruction;  Surgeon: Wylene Simmer, MD;  Location: [place];  Service: Orthopedics;  Laterality: Right;   ACHILLES TENDON SURGERY Right 12/13/2020   Procedure: Right Achilles Tendon Reconstruction;  Surgeon: Wylene Simmer, MD;  Location: [place];  Service: Orthopedics;  Laterality: Right;   ELBOW FRACTURE SURGERY     GASTROC RECESSION EXTREMITY Right 12/13/2020   Procedure: Gastroc Recession;  Surgeon: Wylene Simmer, MD;  Location: [place];  Service: Orthopedics;  Laterality: Right;    There were no vitals filed for this visit.   Subjective Assessment - 05/01/21 1411     Subjective Still having pain in foot ankle and calf. Still odd sensation when walking. Can't put full weight on he feels. Does feel better than last time.    Limitations Standing;Walking;House hold  activities    How Estis can you walk comfortably? 100 feet    Patient Stated Goals get ankle back to normal    Currently in Pain? Yes    Pain Score 5     Pain Location Ankle    Pain Orientation Right    Pain Descriptors / Indicators Burning;Aching;Sore;Dull;Tingling    Pain Type Chronic pain    Pain Onset More than a month ago    Pain Frequency Constant                               [facility] Adult PT Treatment/Exercise - 05/01/21 0001       Exercises   Exercises Ankle      Manual Therapy   Manual Therapy Soft tissue mobilization;Passive ROM    Manual therapy comments Manual complete separate than rest of tx    Soft tissue mobilization IASTM to RT calf, plantar RT foot    Passive ROM RT ankle DF PROM/ stretching to tolerance      Ankle Exercises: Stretches   Gastroc Stretch 3 reps;30 seconds    Gastroc Stretch Limitations at wall      Ankle Exercises: Seated   Towel Crunch --   3 minutes  PT Short Term Goals - 04/09/21 1618       PT SHORT TERM GOAL #1   Title Patient will be independent with HEP in order to improve functional outcomes.    Time 3    Period Weeks    Status New    Target Date 04/30/21      PT SHORT TERM GOAL #2   Title Patient will report at least 25% improvement in symptoms for improved quality of life.    Time 3    Period Weeks    Status New    Target Date 04/30/21               PT Renk Term Goals - 04/09/21 1618       PT Wandersee TERM GOAL #1   Title Patient will report at least 75% improvement in symptoms for improved quality of life.    Time 6    Period Weeks    Status New    Target Date 05/21/21      PT Dalby TERM GOAL #2   Title Patient will improve FOTO score by at least 15 points in order to indicate improved tolerance to activity.    Time 6    Period Weeks    Status New    Target Date 05/21/21      PT Gleghorn TERM GOAL #3   Title Patient will be able to navigate stairs with  reciprocal pattern without compensation in order to demonstrate improved LE strength.    Baseline can navigate with reciprocal pattern but with decreased eccentric control and R ankle DF    Time 5    Period Weeks    Status New    Target Date 05/21/21      PT Beyene TERM GOAL #4   Title Patient will be able to ambulate for at least 30 minutes with pain no greater than 1/10 in order to demonstrate improved ability to ambulate in the community.    Time 6    Period Weeks    Status New    Target Date 05/21/21      PT Littles TERM GOAL #5   Title Patient will be able to ambulate at least 425 feet in 2MWT in order to demonstrate improved gait speed for community ambulation.    Time 6    Period Weeks    Status New    Target Date 05/21/21                   Plan - 05/01/21 1636     Clinical Impression Statement Reviewed goals and HEP. Performed IASTM to RT foot and ankle to address pain and restriction. Patient with increased tenderness to palpation and palpable trigger points. Patient noting improved symptoms following manual treatment. Discussed relevance to current gait mechanism and foot/ calf pain. Added calf stretching and towel scrunch. Educated patient on updated HEP. Patient will continue to benefit from skilled therapy services to reduce deficits and improve functional abilities.    Personal Factors and Comorbidities Behavior Pattern;Social Background;Comorbidity 3+;Fitness;Past/Current Experience;Time since onset of injury/illness/exacerbation    Comorbidities BMI over 30, High Blood Pressure, Prior Surgery, hx Stroke or TIA    Examination-Activity Limitations Squat;Stairs;Stand;Locomotion Level;Lift;Bend;Transfers    Examination-Participation Restrictions Cleaning;Community Activity;Occupation;Shop;Volunteer;Yard Work    Stability/Clinical Decision Making Stable/Uncomplicated    Rehab Potential Fair    PT Frequency 2x / week    PT Duration 6 weeks    PT Treatment/Interventions  ADLs/Self Care Home Management;Cryotherapy;Aquatic Therapy;Electrical Stimulation;Iontophoresis  4mg /ml Dexamethasone;Moist Heat;Traction;Ultrasound;Parrafin;DME Instruction;Gait training;Stair training;Functional mobility training;Therapeutic activities;Therapeutic exercise;Balance training;Neuromuscular re-education;Patient/family education;Orthotic Fit/Training;Manual techniques;Scar mobilization;Compression bandaging;Passive range of motion;Dry needling;Energy conservation;Splinting;Taping;Spinal Manipulations;Joint Manipulations    PT Next Visit Plan begin functional strengthening with stairs, STS; PF strength, R ankle ROM gentle    PT Home Exercise Plan band 4 way, HR 7/6 towel scrunch, calf stretch    Consulted and Agree with Plan of Care Patient             Patient will benefit from skilled therapeutic intervention in order to improve the following deficits and impairments:  Abnormal gait, Decreased endurance, Hypomobility, Decreased activity tolerance, Decreased strength, Pain, Decreased balance, Decreased mobility, Difficulty walking, Decreased range of motion, Impaired perceived functional ability, Impaired flexibility  Visit Diagnosis: Pain in right ankle and joints of right foot  Other abnormalities of gait and mobility  Other symptoms and signs involving the musculoskeletal system  Muscle weakness (generalized)  Stiffness of right ankle, not elsewhere classified     Problem List There are no problems to display for this patient.  4:41 PM, 05/01/21 Josue Hector PT DPT  Physical Therapist with [facility]  [phone] [place] [address] [place], [place], [address] Phone: [phone]   Fax:  [phone]  Name: Mark Anthony MRN: [id number] Date of Birth: [birth year]

## 2021-05-02 ENCOUNTER — Ambulatory Visit (HOSPITAL_COMMUNITY): Payer: No Typology Code available for payment source

## 2021-05-02 ENCOUNTER — Encounter (HOSPITAL_COMMUNITY): Payer: Self-pay

## 2021-05-02 DIAGNOSIS — M6281 Muscle weakness (generalized): Secondary | ICD-10-CM

## 2021-05-02 DIAGNOSIS — R29898 Other symptoms and signs involving the musculoskeletal system: Secondary | ICD-10-CM

## 2021-05-02 DIAGNOSIS — M25571 Pain in right ankle and joints of right foot: Secondary | ICD-10-CM | POA: Diagnosis not present

## 2021-05-02 DIAGNOSIS — R2689 Other abnormalities of gait and mobility: Secondary | ICD-10-CM

## 2021-05-02 DIAGNOSIS — M25671 Stiffness of right ankle, not elsewhere classified: Secondary | ICD-10-CM

## 2021-05-02 NOTE — Therapy (Signed)
[place], [place], [address] Phone: [phone]   Fax:  [phone]  Physical Therapy Treatment  Patient Details  Name: Mark Anthony MRN: [id number] Date of Birth: 1975/11/03 Referring Provider (PT): Mechele Claude PA-C   Encounter Date: 05/02/2021   PT End of Session - 05/02/21 1140     Visit Number 3    Number of Visits 12    Date for PT Re-Evaluation 05/21/21    Authorization Type [facility] (15 visits approved)    Authorization - Visit Number 3    Authorization - Number of Visits 15    PT Start Time 4580    PT Stop Time 1220    PT Time Calculation (min) 45 min    Activity Tolerance Patient tolerated treatment well    Behavior During Therapy [facility] for tasks assessed/performed             Past Medical History:  Diagnosis Date   Hypertension    no meds at this time.   Reflux    TIA (transient ischemic attack)    no meds following    Past Surgical History:  Procedure Laterality Date   ACHILLES TENDON SURGERY Right 02/02/2020   Procedure: Right Achilles tendon debridement and reconstruction;  Surgeon: Wylene Simmer, MD;  Location: [place];  Service: Orthopedics;  Laterality: Right;   ACHILLES TENDON SURGERY Right 12/13/2020   Procedure: Right Achilles Tendon Reconstruction;  Surgeon: Wylene Simmer, MD;  Location: [place];  Service: Orthopedics;  Laterality: Right;   ELBOW FRACTURE SURGERY     GASTROC RECESSION EXTREMITY Right 12/13/2020   Procedure: Gastroc Recession;  Surgeon: Wylene Simmer, MD;  Location: [place];  Service: Orthopedics;  Laterality: Right;    There were no vitals filed for this visit.   Subjective Assessment - 05/02/21 1139     Subjective Reports pain increases with standing, pain scale this session is 4/10 initially.    Patient Stated Goals get ankle back to normal    Currently in Pain? Yes    Pain Score 4     Pain Location Ankle    Pain  Orientation Right    Pain Descriptors / Indicators Burning;Sharp    Pain Type Chronic pain    Pain Onset More than a month ago    Pain Frequency Constant    Aggravating Factors  standing    Pain Relieving Factors stay off of it    Effect of Pain on Daily Activities limits                [facility] PT Assessment - 05/02/21 0001       Assessment   Medical Diagnosis Rupture of R achilles tendon/repair    Referring Provider (PT) Mechele Claude PA-C    Next MD Visit July 25th    Prior Therapy yes      Precautions   Precautions None                           [facility] Adult PT Treatment/Exercise - 05/02/21 0001       Exercises   Exercises Ankle      Manual Therapy   Manual Therapy Soft tissue mobilization;Passive ROM    Manual therapy comments Manual complete separate than rest of tx    Soft tissue mobilization STM to Rt gastroc, soleus, and plantar fascia      Ankle Exercises: Stretches  Plantar Fascia Stretch 3 reps;30 seconds    Plantar Fascia Stretch Limitations heel drop off step      Ankle Exercises: Standing   Heel Raises 15 reps    Other Standing Ankle Exercises 3D ankle excursion standing      Ankle Exercises: Seated   BAPS Level 3;Sitting;15 reps   DF/PF; Inv/Ev; CW/CCW   Other Seated Ankle Exercises STS 10x no HHA eccentric control                      PT Short Term Goals - 04/09/21 1618       PT SHORT TERM GOAL #1   Title Patient will be independent with HEP in order to improve functional outcomes.    Time 3    Period Weeks    Status New    Target Date 04/30/21      PT SHORT TERM GOAL #2   Title Patient will report at least 25% improvement in symptoms for improved quality of life.    Time 3    Period Weeks    Status New    Target Date 04/30/21               PT Cyran Term Goals - 04/09/21 1618       PT Seavey TERM GOAL #1   Title Patient will report at least 75% improvement in symptoms for improved quality of life.     Time 6    Period Weeks    Status New    Target Date 05/21/21      PT Whiteside TERM GOAL #2   Title Patient will improve FOTO score by at least 15 points in order to indicate improved tolerance to activity.    Time 6    Period Weeks    Status New    Target Date 05/21/21      PT Bounds TERM GOAL #3   Title Patient will be able to navigate stairs with reciprocal pattern without compensation in order to demonstrate improved LE strength.    Baseline can navigate with reciprocal pattern but with decreased eccentric control and R ankle DF    Time 5    Period Weeks    Status New    Target Date 05/21/21      PT Cordle TERM GOAL #4   Title Patient will be able to ambulate for at least 30 minutes with pain no greater than 1/10 in order to demonstrate improved ability to ambulate in the community.    Time 6    Period Weeks    Status New    Target Date 05/21/21      PT Shatto TERM GOAL #5   Title Patient will be able to ambulate at least 425 feet in 2MWT in order to demonstrate improved gait speed for community ambulation.    Time 6    Period Weeks    Status New    Target Date 05/21/21                   Plan - 05/02/21 1309     Clinical Impression Statement Session focus on ankle mobility and strengthening.  Pt reports increased pain with decreased standing tolerance.  Added plantar fascia and 3D ankle excursion streches for mobility.  EOS with manual STM to address soft tissue restricitons noted in medial gastroc and plantar fascia.  Reports of relief at EOS.    Personal Factors and Comorbidities Behavior Pattern;Social Background;Comorbidity 3+;Fitness;Past/Current Experience;Time since  onset of injury/illness/exacerbation    Comorbidities BMI over 30, High Blood Pressure, Prior Surgery, hx Stroke or TIA    Examination-Activity Limitations Squat;Stairs;Stand;Locomotion Level;Lift;Bend;Transfers    Examination-Participation Restrictions Cleaning;Community  Activity;Occupation;Shop;Volunteer;Yard Work    Stability/Clinical Decision Making Stable/Uncomplicated    Designer, jewellery Low    Rehab Potential Fair    PT Frequency 2x / week    PT Duration 6 weeks    PT Treatment/Interventions ADLs/Self Care Home Management;Cryotherapy;Aquatic Therapy;Electrical Stimulation;Iontophoresis 4mg /ml Dexamethasone;Moist Heat;Traction;Ultrasound;Parrafin;DME Instruction;Gait training;Stair training;Functional mobility training;Therapeutic activities;Therapeutic exercise;Balance training;Neuromuscular re-education;Patient/family education;Orthotic Fit/Training;Manual techniques;Scar mobilization;Compression bandaging;Passive range of motion;Dry needling;Energy conservation;Splinting;Taping;Spinal Manipulations;Joint Manipulations    PT Next Visit Plan begin functional strengthening with stairs, STS; PF strength, R ankle ROM gentle    PT Home Exercise Plan band 4 way, HR 7/6 towel scrunch, calf stretch    Consulted and Agree with Plan of Care Patient             Patient will benefit from skilled therapeutic intervention in order to improve the following deficits and impairments:  Abnormal gait, Decreased endurance, Hypomobility, Decreased activity tolerance, Decreased strength, Pain, Decreased balance, Decreased mobility, Difficulty walking, Decreased range of motion, Impaired perceived functional ability, Impaired flexibility  Visit Diagnosis: Pain in right ankle and joints of right foot  Other abnormalities of gait and mobility  Other symptoms and signs involving the musculoskeletal system  Muscle weakness (generalized)  Stiffness of right ankle, not elsewhere classified     Problem List There are no problems to display for this patient.  Ihor Austin, LPTA/CLT; CBIS [phone]  Aldona Lento 05/02/2021, 1:17 PM  [facility] [address] [place], [place], [address] Phone:  [phone]   Fax:  [phone]  Name: Susan Arana MRN: [id number] Date of Birth: [birth year]

## 2021-05-03 ENCOUNTER — Ambulatory Visit (HOSPITAL_COMMUNITY): Payer: No Typology Code available for payment source | Admitting: Physical Therapy

## 2021-05-07 ENCOUNTER — Ambulatory Visit (HOSPITAL_COMMUNITY): Payer: No Typology Code available for payment source

## 2021-05-07 ENCOUNTER — Encounter (HOSPITAL_COMMUNITY): Payer: Self-pay

## 2021-05-07 ENCOUNTER — Other Ambulatory Visit: Payer: Self-pay

## 2021-05-07 DIAGNOSIS — M25571 Pain in right ankle and joints of right foot: Secondary | ICD-10-CM

## 2021-05-07 DIAGNOSIS — R2689 Other abnormalities of gait and mobility: Secondary | ICD-10-CM

## 2021-05-07 DIAGNOSIS — M6281 Muscle weakness (generalized): Secondary | ICD-10-CM

## 2021-05-07 DIAGNOSIS — R29898 Other symptoms and signs involving the musculoskeletal system: Secondary | ICD-10-CM

## 2021-05-07 DIAGNOSIS — M25671 Stiffness of right ankle, not elsewhere classified: Secondary | ICD-10-CM

## 2021-05-07 NOTE — Therapy (Signed)
[facility] [address] [place], [place], [address] Phone: [phone]   Fax:  [phone]  Physical Therapy Treatment  Patient Details  Name: Mark Anthony MRN: [id number] Date of Birth: 1976-08-31 Referring Provider (PT): Mechele Claude PA-C   Encounter Date: 05/07/2021   PT End of Session - 05/07/21 1454     Visit Number 4    Number of Visits 12    Date for PT Re-Evaluation 05/21/21    Authorization Type [facility] (15 visits approved)    Authorization - Visit Number 4    Authorization - Number of Visits 15    PT Start Time 1450    PT Stop Time 1528    PT Time Calculation (min) 38 min    Activity Tolerance Patient tolerated treatment well    Behavior During Therapy [facility] for tasks assessed/performed             Past Medical History:  Diagnosis Date   Hypertension    no meds at this time.   Reflux    TIA (transient ischemic attack)    no meds following    Past Surgical History:  Procedure Laterality Date   ACHILLES TENDON SURGERY Right 02/02/2020   Procedure: Right Achilles tendon debridement and reconstruction;  Surgeon: Wylene Simmer, MD;  Location: [place];  Service: Orthopedics;  Laterality: Right;   ACHILLES TENDON SURGERY Right 12/13/2020   Procedure: Right Achilles Tendon Reconstruction;  Surgeon: Wylene Simmer, MD;  Location: [place];  Service: Orthopedics;  Laterality: Right;   ELBOW FRACTURE SURGERY     GASTROC RECESSION EXTREMITY Right 12/13/2020   Procedure: Gastroc Recession;  Surgeon: Wylene Simmer, MD;  Location: [place];  Service: Orthopedics;  Laterality: Right;    There were no vitals filed for this visit.   Subjective Assessment - 05/07/21 1453     Subjective Ankle/foot is feeling good today, pain is low today.    Patient Stated Goals get ankle back to normal    Currently in Pain? Yes    Pain Score 2     Pain Location Ankle    Pain Orientation Right    Pain  Descriptors / Indicators Tingling    Pain Type Chronic pain    Pain Onset More than a month ago    Pain Frequency Constant    Aggravating Factors  standing    Pain Relieving Factors stay off of it    Effect of Pain on Daily Activities limits                               [facility] Adult PT Treatment/Exercise - 05/07/21 0001       Exercises   Exercises Ankle      Manual Therapy   Manual Therapy Soft tissue mobilization;Passive ROM    Manual therapy comments Manual complete separate than rest of tx    Soft tissue mobilization STM to Rt gastroc, soleus, and plantar fascia    Passive ROM RT ankle DF PROM/ stretching to tolerance      Ankle Exercises: Stretches   Plantar Fascia Stretch 3 reps;30 seconds    Plantar Fascia Stretch Limitations heel drop off step    Gastroc Stretch 3 reps;30 seconds    Gastroc Stretch Limitations slant board    Other Stretch knee drive over 72ZD step      Ankle Exercises: Standing   Heel Raises 10 reps  3 sets   Heel Walk (Round Trip) unable    Toe Walk (Round Trip) 1RT    Other Standing Ankle Exercises Step up 6in then reciprocal pattern on 7in step height 5RT                      PT Short Term Goals - 04/09/21 1618       PT SHORT TERM GOAL #1   Title Patient will be independent with HEP in order to improve functional outcomes.    Time 3    Period Weeks    Status New    Target Date 04/30/21      PT SHORT TERM GOAL #2   Title Patient will report at least 25% improvement in symptoms for improved quality of life.    Time 3    Period Weeks    Status New    Target Date 04/30/21               PT [place] Term Goals - 04/09/21 1618       PT Cercone TERM GOAL #1   Title Patient will report at least 75% improvement in symptoms for improved quality of life.    Time 6    Period Weeks    Status New    Target Date 05/21/21      PT [place] TERM GOAL #2   Title Patient will improve FOTO score by at least 15 points  in order to indicate improved tolerance to activity.    Time 6    Period Weeks    Status New    Target Date 05/21/21      PT Pavey TERM GOAL #3   Title Patient will be able to navigate stairs with reciprocal pattern without compensation in order to demonstrate improved LE strength.    Baseline can navigate with reciprocal pattern but with decreased eccentric control and R ankle DF    Time 5    Period Weeks    Status New    Target Date 05/21/21      PT Printup TERM GOAL #4   Title Patient will be able to ambulate for at least 30 minutes with pain no greater than 1/10 in order to demonstrate improved ability to ambulate in the community.    Time 6    Period Weeks    Status New    Target Date 05/21/21      PT Gracey TERM GOAL #5   Title Patient will be able to ambulate at least 425 feet in 2MWT in order to demonstrate improved gait speed for community ambulation.    Time 6    Period Weeks    Status New    Target Date 05/21/21                   Plan - 05/07/21 1507     Clinical Impression Statement Pt presents with significant gastroc weakness.  Session focus on ankle mobility and strengthening.  Began stair training for functional strengthening, pt able to complete reciprocal pattern with 1 HR.  Increased difficutly descending stairs due to mobility.  Increased focus with PROM during manual.  Noted tightness plantar fascia, improved mobility with gastroc muscuature.    Personal Factors and Comorbidities Behavior Pattern;Social Background;Comorbidity 3+;Fitness;Past/Current Experience;Time since onset of injury/illness/exacerbation    Comorbidities BMI over 30, High Blood Pressure, Prior Surgery, hx Stroke or TIA    Examination-Activity Limitations Squat;Stairs;Stand;Locomotion Level;Lift;Bend;Transfers    Examination-Participation Restrictions Cleaning;Community Activity;Occupation;Shop;Volunteer;[employer]  Work    Stability/Clinical Decision Making Stable/Uncomplicated    Surveyor, mining Low    Rehab Potential Fair    PT Frequency 2x / week    PT Duration 6 weeks    PT Treatment/Interventions ADLs/Self Care Home Management;Cryotherapy;Aquatic Therapy;Electrical Stimulation;Iontophoresis 4mg /ml Dexamethasone;Moist Heat;Traction;Ultrasound;Parrafin;DME Instruction;Gait training;Stair training;Functional mobility training;Therapeutic activities;Therapeutic exercise;Balance training;Neuromuscular re-education;Patient/family education;Orthotic Fit/Training;Manual techniques;Scar mobilization;Compression bandaging;Passive range of motion;Dry needling;Energy conservation;Splinting;Taping;Spinal Manipulations;Joint Manipulations    PT Next Visit Plan begin functional strengthening with stairs, STS; PF strength, R ankle ROM gentle    PT Home Exercise Plan band 4 way, HR 7/6 towel scrunch, calf stretch; 7/12: heel raises 30-50 a day    Consulted and Agree with Plan of Care Patient             Patient will benefit from skilled therapeutic intervention in order to improve the following deficits and impairments:  Abnormal gait, Decreased endurance, Hypomobility, Decreased activity tolerance, Decreased strength, Pain, Decreased balance, Decreased mobility, Difficulty walking, Decreased range of motion, Impaired perceived functional ability, Impaired flexibility  Visit Diagnosis: Pain in right ankle and joints of right foot  Other abnormalities of gait and mobility  Other symptoms and signs involving the musculoskeletal system  Muscle weakness (generalized)  Stiffness of right ankle, not elsewhere classified     Problem List There are no problems to display for this patient.  Ihor Austin, LPTA/CLT; CBIS [phone]  Aldona Lento 05/07/2021, 5:22 PM  [facility] [place], [place], [address] Phone: [phone]   Fax:  [phone]  Name: Shashwat Cleary MRN: [id number] Date of Birth:  Feb 27, 1976

## 2021-05-09 ENCOUNTER — Ambulatory Visit (HOSPITAL_COMMUNITY): Payer: No Typology Code available for payment source | Admitting: Physical Therapy

## 2021-05-14 ENCOUNTER — Other Ambulatory Visit: Payer: Self-pay

## 2021-05-14 ENCOUNTER — Encounter (HOSPITAL_COMMUNITY): Payer: Self-pay

## 2021-05-14 ENCOUNTER — Ambulatory Visit (HOSPITAL_COMMUNITY): Payer: No Typology Code available for payment source

## 2021-05-14 DIAGNOSIS — M25571 Pain in right ankle and joints of right foot: Secondary | ICD-10-CM

## 2021-05-14 DIAGNOSIS — R29898 Other symptoms and signs involving the musculoskeletal system: Secondary | ICD-10-CM

## 2021-05-14 DIAGNOSIS — M6281 Muscle weakness (generalized): Secondary | ICD-10-CM

## 2021-05-14 DIAGNOSIS — M25671 Stiffness of right ankle, not elsewhere classified: Secondary | ICD-10-CM

## 2021-05-14 DIAGNOSIS — R2689 Other abnormalities of gait and mobility: Secondary | ICD-10-CM

## 2021-05-14 NOTE — Therapy (Signed)
[facility] [address] [place], [place], [address] Phone: [phone]   Fax:  [phone]  Physical Therapy Treatment  Patient Details  Name: Mark Anthony MRN: [id number] Date of Birth: February 20, 1976 Referring Provider (PT): Mechele Claude PA-C   Encounter Date: 05/14/2021   PT End of Session - 05/14/21 1546     Visit Number 5    Number of Visits 12    Date for PT Re-Evaluation 05/21/21    Authorization Type VA (15 visits approved)    Authorization - Visit Number 5    Authorization - Number of Visits 15    PT Start Time 0017    PT Stop Time 1615    PT Time Calculation (min) 40 min    Activity Tolerance Patient tolerated treatment well    Behavior During Therapy [facility] for tasks assessed/performed             Past Medical History:  Diagnosis Date   Hypertension    no meds at this time.   Reflux    TIA (transient ischemic attack)    no meds following    Past Surgical History:  Procedure Laterality Date   ACHILLES TENDON SURGERY Right 02/02/2020   Procedure: Right Achilles tendon debridement and reconstruction;  Surgeon: Wylene Simmer, MD;  Location: [place];  Service: Orthopedics;  Laterality: Right;   ACHILLES TENDON SURGERY Right 12/13/2020   Procedure: Right Achilles Tendon Reconstruction;  Surgeon: Wylene Simmer, MD;  Location: [place];  Service: Orthopedics;  Laterality: Right;   ELBOW FRACTURE SURGERY     GASTROC RECESSION EXTREMITY Right 12/13/2020   Procedure: Gastroc Recession;  Surgeon: Wylene Simmer, MD;  Location: [place];  Service: Orthopedics;  Laterality: Right;    There were no vitals filed for this visit.   Subjective Assessment - 05/14/21 1543     Subjective Saw MD earlier today, doctor stated it is going to take some time.  Pain is low today at 2-3/10    Patient Stated Goals get ankle back to normal    Currently in Pain? Yes    Pain Score 3     Pain Location  Ankle   heel/arch pain   Pain Orientation Right    Pain Descriptors / Indicators Aching    Pain Type Chronic pain    Pain Onset More than a month ago    Pain Frequency Constant    Aggravating Factors  standing    Pain Relieving Factors stay off of it    Effect of Pain on Daily Activities limits                [facility] PT Assessment - 05/14/21 0001       Assessment   Medical Diagnosis Rupture of R achilles tendon/repair    Referring Provider (PT) Mechele Claude PA-C    Next MD Visit 8 weeks    Prior Therapy yes      Precautions   Precautions None                           [facility] Adult PT Treatment/Exercise - 05/14/21 0001       Exercises   Exercises Ankle      Ankle Exercises: Stretches   Plantar Fascia Stretch 3 reps;30 seconds    Plantar Fascia Stretch Limitations heel drop off step    Gastroc Stretch 3 reps;30 seconds    Gastroc Stretch Limitations  slant board      Ankle Exercises: Standing   BAPS Standing;Level 2;10 reps    BAPS Limitations DF/PF; Inv/Ev; Cw/CCW    Heel Raises 10 reps   3 sets   Other Standing Ankle Exercises squat 2x 10; attempted posterior lunge x2 c/o cramping    Other Standing Ankle Exercises Step ups 15x 7in height; 4in step height step down 15x                      PT Short Term Goals - 04/09/21 1618       PT SHORT TERM GOAL #1   Title Patient will be independent with HEP in order to improve functional outcomes.    Time 3    Period Weeks    Status New    Target Date 04/30/21      PT SHORT TERM GOAL #2   Title Patient will report at least 25% improvement in symptoms for improved quality of life.    Time 3    Period Weeks    Status New    Target Date 04/30/21               PT Kreitzer Term Goals - 04/09/21 1618       PT Lieber TERM GOAL #1   Title Patient will report at least 75% improvement in symptoms for improved quality of life.    Time 6    Period Weeks    Status New    Target Date  05/21/21      PT Mosso TERM GOAL #2   Title Patient will improve FOTO score by at least 15 points in order to indicate improved tolerance to activity.    Time 6    Period Weeks    Status New    Target Date 05/21/21      PT Delbene TERM GOAL #3   Title Patient will be able to navigate stairs with reciprocal pattern without compensation in order to demonstrate improved LE strength.    Baseline can navigate with reciprocal pattern but with decreased eccentric control and R ankle DF    Time 5    Period Weeks    Status New    Target Date 05/21/21      PT Phebus TERM GOAL #4   Title Patient will be able to ambulate for at least 30 minutes with pain no greater than 1/10 in order to demonstrate improved ability to ambulate in the community.    Time 6    Period Weeks    Status New    Target Date 05/21/21      PT Neidlinger TERM GOAL #5   Title Patient will be able to ambulate at least 425 feet in 2MWT in order to demonstrate improved gait speed for community ambulation.    Time 6    Period Weeks    Status New    Target Date 05/21/21                   Plan - 05/14/21 1732     Clinical Impression Statement Progressed ankle strength and stability with additional standing exercises.  Added standing BAPS, squats, lunges and step up/down for functional mobility to improve dorsiflexion with some cueing for proper form and mechanics.  No reports of increased pain at EOS.    Personal Factors and Comorbidities Behavior Pattern;Social Background;Comorbidity 3+;Fitness;Past/Current Experience;Time since onset of injury/illness/exacerbation    Comorbidities BMI over 30, High Blood Pressure, Prior Surgery,  hx Stroke or TIA    Examination-Activity Limitations Squat;Stairs;Stand;Locomotion Level;Lift;Bend;Transfers    Examination-Participation Restrictions Cleaning;Community Activity;Occupation;Shop;Volunteer;Yard Work    Stability/Clinical Decision Making Stable/Uncomplicated    Therapist, sports Low    Rehab Potential Fair    PT Frequency 2x / week    PT Duration 6 weeks    PT Treatment/Interventions ADLs/Self Care Home Management;Cryotherapy;Aquatic Therapy;Electrical Stimulation;Iontophoresis 4mg /ml Dexamethasone;Moist Heat;Traction;Ultrasound;Parrafin;DME Instruction;Gait training;Stair training;Functional mobility training;Therapeutic activities;Therapeutic exercise;Balance training;Neuromuscular re-education;Patient/family education;Orthotic Fit/Training;Manual techniques;Scar mobilization;Compression bandaging;Passive range of motion;Dry needling;Energy conservation;Splinting;Taping;Spinal Manipulations;Joint Manipulations    PT Next Visit Plan begin functional strengthening with stairs, STS; PF strength, R ankle ROM gentle    PT Home Exercise Plan band 4 way, HR 7/6 towel scrunch, calf stretch; 7/12: heel raises 30-50 a day    Consulted and Agree with Plan of Care Patient             Patient will benefit from skilled therapeutic intervention in order to improve the following deficits and impairments:  Abnormal gait, Decreased endurance, Hypomobility, Decreased activity tolerance, Decreased strength, Pain, Decreased balance, Decreased mobility, Difficulty walking, Decreased range of motion, Impaired perceived functional ability, Impaired flexibility  Visit Diagnosis: Pain in right ankle and joints of right foot  Other abnormalities of gait and mobility  Other symptoms and signs involving the musculoskeletal system  Muscle weakness (generalized)  Stiffness of right ankle, not elsewhere classified     Problem List There are no problems to display for this patient.   Ihor Austin, LPTA/CLT; CBIS [phone] Aldona Lento 05/14/2021, 5:35 PM  [facility] [address] [place], [place], [address] Phone: [phone]   Fax:  [phone]  Name: Yianni Skilling MRN: [id number] Date of Birth: 06/07/1976

## 2021-05-16 ENCOUNTER — Ambulatory Visit (HOSPITAL_COMMUNITY): Payer: No Typology Code available for payment source

## 2021-05-21 ENCOUNTER — Ambulatory Visit (HOSPITAL_COMMUNITY): Payer: No Typology Code available for payment source | Admitting: Physical Therapy

## 2021-05-21 ENCOUNTER — Telehealth (HOSPITAL_COMMUNITY): Payer: Self-pay | Admitting: Physical Therapy

## 2021-05-21 NOTE — Telephone Encounter (Signed)
Called patient about missed appointment today, unable to leave message as VM has not been set up.  5:49 PM, 05/21/21 Josue Hector PT DPT  Physical Therapist with [facility]  [phone]

## 2021-05-22 ENCOUNTER — Other Ambulatory Visit: Payer: Self-pay

## 2021-05-22 ENCOUNTER — Ambulatory Visit (HOSPITAL_COMMUNITY): Payer: No Typology Code available for payment source | Admitting: Physical Therapy

## 2021-05-22 DIAGNOSIS — M25671 Stiffness of right ankle, not elsewhere classified: Secondary | ICD-10-CM

## 2021-05-22 DIAGNOSIS — M6281 Muscle weakness (generalized): Secondary | ICD-10-CM

## 2021-05-22 DIAGNOSIS — R29898 Other symptoms and signs involving the musculoskeletal system: Secondary | ICD-10-CM

## 2021-05-22 DIAGNOSIS — R2689 Other abnormalities of gait and mobility: Secondary | ICD-10-CM

## 2021-05-22 DIAGNOSIS — M25571 Pain in right ankle and joints of right foot: Secondary | ICD-10-CM

## 2021-05-22 NOTE — Therapy (Signed)
[facility] [address] [place], [place], [address] Phone: [phone]   Fax:  [phone]  Physical Therapy Treatment Progress Note Reporting Period 04/09/2021 to 05/22/2021  See note below for Objective Data and Assessment of Progress/Goals.     Patient Details  Name: Mark Anthony MRN: [id number] Date of Birth: 1976-02-13 Referring Provider (PT): Mechele Claude PA-C   Encounter Date: 05/22/2021   PT End of Session - 05/22/21 1551     Visit Number 6    Number of Visits 12    Date for PT Re-Evaluation 06/21/21    Authorization Type VA (15 visits approved)    Authorization - Visit Number 6    Authorization - Number of Visits 15    PT Start Time 3143    PT Stop Time 1616    PT Time Calculation (min) 41 min    Activity Tolerance Patient tolerated treatment well    Behavior During Therapy [facility] for tasks assessed/performed             Past Medical History:  Diagnosis Date   Hypertension    no meds at this time.   Reflux    TIA (transient ischemic attack)    no meds following    Past Surgical History:  Procedure Laterality Date   ACHILLES TENDON SURGERY Right 02/02/2020   Procedure: Right Achilles tendon debridement and reconstruction;  Surgeon: Wylene Simmer, MD;  Location: [place];  Service: Orthopedics;  Laterality: Right;   ACHILLES TENDON SURGERY Right 12/13/2020   Procedure: Right Achilles Tendon Reconstruction;  Surgeon: Wylene Simmer, MD;  Location: [place];  Service: Orthopedics;  Laterality: Right;   ELBOW FRACTURE SURGERY     GASTROC RECESSION EXTREMITY Right 12/13/2020   Procedure: Gastroc Recession;  Surgeon: Wylene Simmer, MD;  Location: [place];  Service: Orthopedics;  Laterality: Right;    There were no vitals filed for this visit.       [facility] PT Assessment - 05/22/21 1538       Assessment   Medical Diagnosis Rupture of R achilles tendon/repair     Referring Provider (PT) Mechele Claude PA-C    Next MD Visit July 25th    Prior Therapy yes      Precautions   Precautions None      Restrictions   Weight Bearing Restrictions No      Prior Function   Level of Independence Independent      Cognition   Overall Cognitive Status Within Functional Limits for tasks assessed      Observation/Other Assessments   Observations ambulates with antalgic gait, decreased DF on R with foot externally rotated    Focus on Therapeutic Outcomes (FOTO)  47% functional status (was 34% functional status)      AROM   Right Ankle Dorsiflexion 2   was 0   Right Ankle Plantar Flexion 45   was 45   Right Ankle Inversion 30   was 21   Right Ankle Eversion 20   was 12   Left Ankle Dorsiflexion 5    Left Ankle Plantar Flexion 40    Left Ankle Inversion 35    Left Ankle Eversion 35      Strength   Overall Strength Comments strength tested in available ROM RT    Right Knee Flexion 5/5   was 4+/5   Right Knee Extension 5/5   was 4+/5   Left Knee Flexion 5/5   was 5/5  Left Knee Extension 5/5   was 5/5   Right Ankle Dorsiflexion 5/5   was 5/5   Right Ankle Plantar Flexion 2-/5   NT   Right Ankle Inversion 5/5   was 5/5   Right Ankle Eversion 5/5   was 5/5   Left Ankle Dorsiflexion 5/5   was 5/5   Left Ankle Plantar Flexion 5/5   NT   Left Ankle Inversion 5/5   was 5/5   Left Ankle Eversion 5/5   was 5/5     Transfers   Five time sit to stand comments  8.98 without UE support   was 18.93 seconds     Ambulation/Gait   Ambulation/Gait Yes    Ambulation Distance (Feet) 350 Feet   was 350 feet   Assistive device None    Gait Pattern Antalgic    Gait Comments 2MWT, antalgic gait with foot externally rotated, stairs - completes with alternaing pattern, slow, labored, turned to R                                     PT Short Term Goals - 05/22/21 1614       PT SHORT TERM GOAL #1   Title Patient will be independent with HEP  in order to improve functional outcomes.    Time 3    Period Weeks    Status Achieved    Target Date 04/30/21      PT SHORT TERM GOAL #2   Title Patient will report at least 25% improvement in symptoms for improved quality of life.    Time 3    Period Weeks    Status Achieved    Target Date 04/30/21               PT Orlov Term Goals - 05/22/21 1615       PT Street TERM GOAL #1   Title Patient will report at least 75% improvement in symptoms for improved quality of life.    Time 6    Period Weeks    Status On-going      PT Lindell TERM GOAL #2   Title Patient will improve FOTO score by at least 15 points in order to indicate improved tolerance to activity.    Time 6    Period Weeks    Status On-going      PT Nading TERM GOAL #3   Title Patient will be able to navigate stairs with reciprocal pattern without compensation in order to demonstrate improved LE strength.    Baseline can navigate with reciprocal pattern but with decreased eccentric control and R ankle DF    Time 5    Period Weeks    Status On-going      PT Spraker TERM GOAL #4   Title Patient will be able to ambulate for at least 30 minutes with pain no greater than 1/10 in order to demonstrate improved ability to ambulate in the community.    Time 6    Period Weeks    Status On-going      PT Niebla TERM GOAL #5   Title Patient will be able to ambulate at least 425 feet in 2MWT in order to demonstrate improved gait speed for community ambulation.    Time 6    Period Weeks    Status On-going  Plan - 05/22/21 1633     Clinical Impression Statement pt has completed 6 visits in the past 4 weeks with noted gains in ROM and strength.  Pt has met all STG's and is progressing towards LTG's.  Major deficit at this point is gastroc weakness and apprehension of reinjury.  Pt continues to ambulate with antalgic gait pattern and is lacking full ROM in Rt ankle.  Pt would benefit from continued skilled  therapy X4 more weeks to improve functional strength and mobility.    Personal Factors and Comorbidities Behavior Pattern;Social Background;Comorbidity 3+;Fitness;Past/Current Experience;Time since onset of injury/illness/exacerbation    Comorbidities BMI over 30, High Blood Pressure, Prior Surgery, hx Stroke or TIA    Examination-Activity Limitations Squat;Stairs;Stand;Locomotion Level;Lift;Bend;Transfers    Examination-Participation Restrictions Cleaning;Community Activity;Occupation;Shop;Volunteer;Yard Work    Stability/Clinical Decision Making Stable/Uncomplicated    Rehab Potential Fair    PT Frequency 2x / week    PT Duration 6 weeks    PT Treatment/Interventions ADLs/Self Care Home Management;Cryotherapy;Aquatic Therapy;Electrical Stimulation;Iontophoresis 25m/ml Dexamethasone;Moist Heat;Traction;Ultrasound;Parrafin;DME Instruction;Gait training;Stair training;Functional mobility training;Therapeutic activities;Therapeutic exercise;Balance training;Neuromuscular re-education;Patient/family education;Orthotic Fit/Training;Manual techniques;Scar mobilization;Compression bandaging;Passive range of motion;Dry needling;Energy conservation;Splinting;Taping;Spinal Manipulations;Joint Manipulations    PT Next Visit Plan Progress functional strengthening with stairs, STS; PF strength, R ankle ROM.    PT Home Exercise Plan band 4 way, HR 7/6 towel scrunch, calf stretch; 7/12: heel raises 30-50 a day    Consulted and Agree with Plan of Care Patient             Patient will benefit from skilled therapeutic intervention in order to improve the following deficits and impairments:  Abnormal gait, Decreased endurance, Hypomobility, Decreased activity tolerance, Decreased strength, Pain, Decreased balance, Decreased mobility, Difficulty walking, Decreased range of motion, Impaired perceived functional ability, Impaired flexibility  Visit Diagnosis: Pain in right ankle and joints of right foot  Other  abnormalities of gait and mobility  Other symptoms and signs involving the musculoskeletal system  Muscle weakness (generalized)  Stiffness of right ankle, not elsewhere classified     Problem List There are no problems to display for this patient.  ATeena Irani PTA/CLT [phone] FTeena Irani7/27/2022, 4:37 PM  [place] [address]Phone: [phone]  Fax:  [phone] Name: Mark TozziMRN: [id number] of Birth: [birth year]

## 2021-05-23 ENCOUNTER — Ambulatory Visit (HOSPITAL_COMMUNITY): Payer: No Typology Code available for payment source | Admitting: Physical Therapy

## 2021-05-23 ENCOUNTER — Encounter (HOSPITAL_COMMUNITY): Payer: Self-pay | Admitting: Physical Therapy

## 2021-05-23 DIAGNOSIS — R29898 Other symptoms and signs involving the musculoskeletal system: Secondary | ICD-10-CM

## 2021-05-23 DIAGNOSIS — M6281 Muscle weakness (generalized): Secondary | ICD-10-CM

## 2021-05-23 DIAGNOSIS — M25571 Pain in right ankle and joints of right foot: Secondary | ICD-10-CM | POA: Diagnosis not present

## 2021-05-23 DIAGNOSIS — R2689 Other abnormalities of gait and mobility: Secondary | ICD-10-CM

## 2021-05-23 DIAGNOSIS — M25671 Stiffness of right ankle, not elsewhere classified: Secondary | ICD-10-CM

## 2021-05-23 NOTE — Therapy (Signed)
[facility] [address] [place], [place], [address] Phone: [phone]   Fax:  [phone]  Physical Therapy Treatment  Patient Details  Name: Mark Anthony MRN: [id number] Date of Birth: 1976/01/31 Referring Provider (PT): Mechele Claude PA-C   Encounter Date: 05/23/2021   PT End of Session - 05/23/21 1527     Visit Number 7    Number of Visits 12    Date for PT Re-Evaluation 06/21/21    Authorization Type VA (15 visits approved)    Authorization - Visit Number 7    Authorization - Number of Visits 15    PT Start Time 1522    PT Stop Time 1605    PT Time Calculation (min) 43 min    Activity Tolerance Patient tolerated treatment well    Behavior During Therapy [facility] for tasks assessed/performed             Past Medical History:  Diagnosis Date   Hypertension    no meds at this time.   Reflux    TIA (transient ischemic attack)    no meds following    Past Surgical History:  Procedure Laterality Date   ACHILLES TENDON SURGERY Right 02/02/2020   Procedure: Right Achilles tendon debridement and reconstruction;  Surgeon: Wylene Simmer, MD;  Location: [place];  Service: Orthopedics;  Laterality: Right;   ACHILLES TENDON SURGERY Right 12/13/2020   Procedure: Right Achilles Tendon Reconstruction;  Surgeon: Wylene Simmer, MD;  Location: [place];  Service: Orthopedics;  Laterality: Right;   ELBOW FRACTURE SURGERY     GASTROC RECESSION EXTREMITY Right 12/13/2020   Procedure: Gastroc Recession;  Surgeon: Wylene Simmer, MD;  Location: [place];  Service: Orthopedics;  Laterality: Right;    There were no vitals filed for this visit.   Subjective Assessment - 05/23/21 1523     Subjective Ankle feels pretty good right now. Was sore from walking over the weekend.    Patient Stated Goals get ankle back to normal    Currently in Pain? Yes    Pain Score 2     Pain Location Ankle    Pain  Orientation Right    Pain Descriptors / Indicators Aching    Pain Type Chronic pain    Pain Onset More than a month ago                               [facility] Adult PT Treatment/Exercise - 05/23/21 0001       Ankle Exercises: Aerobic   Recumbent Bike 4 min dynamic warmup Lv 2      Ankle Exercises: Stretches   Gastroc Stretch 3 reps;30 seconds    Gastroc Stretch Limitations from step      Ankle Exercises: Standing   Rocker Board 3 minutes   DF/PF   Heel Raises 20 reps    Toe Raise --   from step   Other Standing Ankle Exercises machine walkout 70# x10, FWD step ups on 7 inch x20, step down from 4 inch x 20, gait training 2 RT in clinic cues for smooth heel toe transition    Other Standing Ankle Exercises tandem stance on 1/2 foam 3 x 30"                      PT Short Term Goals - 05/22/21 1614       PT  SHORT TERM GOAL #1   Title Patient will be independent with HEP in order to improve functional outcomes.    Time 3    Period Weeks    Status Achieved    Target Date 04/30/21      PT SHORT TERM GOAL #2   Title Patient will report at least 25% improvement in symptoms for improved quality of life.    Time 3    Period Weeks    Status Achieved    Target Date 04/30/21               PT Heyboer Term Goals - 05/22/21 1615       PT [place] TERM GOAL #1   Title Patient will report at least 75% improvement in symptoms for improved quality of life.    Time 6    Period Weeks    Status On-going      PT Doescher TERM GOAL #2   Title Patient will improve FOTO score by at least 15 points in order to indicate improved tolerance to activity.    Time 6    Period Weeks    Status On-going      PT Czajka TERM GOAL #3   Title Patient will be able to navigate stairs with reciprocal pattern without compensation in order to demonstrate improved LE strength.    Baseline can navigate with reciprocal pattern but with decreased eccentric control and R ankle DF     Time 5    Period Weeks    Status On-going      PT Popson TERM GOAL #4   Title Patient will be able to ambulate for at least 30 minutes with pain no greater than 1/10 in order to demonstrate improved ability to ambulate in the community.    Time 6    Period Weeks    Status On-going      PT Bradsher TERM GOAL #5   Title Patient will be able to ambulate at least 425 feet in 2MWT in order to demonstrate improved gait speed for community ambulation.    Time 6    Period Weeks    Status On-going                   Plan - 05/23/21 1616     Clinical Impression Statement Patient tolerated session well today. Noted difficulty with step downs, but improved with practice. Requires cues to avoid foot ER with stairs and clinic ambulation. Showing improved heel to toe transition today. Good static balance. Progressed strengthening activity with added machine walkouts. Patient will continue to benefit from skilled therapy services to reduce deficits and improve functional ability.    Personal Factors and Comorbidities Behavior Pattern;Social Background;Comorbidity 3+;Fitness;Past/Current Experience;Time since onset of injury/illness/exacerbation    Comorbidities BMI over 30, High Blood Pressure, Prior Surgery, hx Stroke or TIA    Examination-Activity Limitations Squat;Stairs;Stand;Locomotion Level;Lift;Bend;Transfers    Examination-Participation Restrictions Cleaning;Community Activity;Occupation;Shop;Volunteer;Yard Work    Stability/Clinical Decision Making Stable/Uncomplicated    Rehab Potential Fair    PT Frequency 2x / week    PT Duration 6 weeks    PT Treatment/Interventions ADLs/Self Care Home Management;Cryotherapy;Aquatic Therapy;Electrical Stimulation;Iontophoresis '4mg'$ /ml Dexamethasone;Moist Heat;Traction;Ultrasound;Parrafin;DME Instruction;Gait training;Stair training;Functional mobility training;Therapeutic activities;Therapeutic exercise;Balance training;Neuromuscular  re-education;Patient/family education;Orthotic Fit/Training;Manual techniques;Scar mobilization;Compression bandaging;Passive range of motion;Dry needling;Energy conservation;Splinting;Taping;Spinal Manipulations;Joint Manipulations    PT Next Visit Plan Progress functional strengthening with stairs, STS; PF strength, R ankle ROM. Sidestepping. Gait on balance beam    PT Home Exercise Plan band 4  way, HR 7/6 towel scrunch, calf stretch; 7/12: heel raises 30-50 a day    Consulted and Agree with Plan of Care Patient             Patient will benefit from skilled therapeutic intervention in order to improve the following deficits and impairments:  Abnormal gait, Decreased endurance, Hypomobility, Decreased activity tolerance, Decreased strength, Pain, Decreased balance, Decreased mobility, Difficulty walking, Decreased range of motion, Impaired perceived functional ability, Impaired flexibility  Visit Diagnosis: Pain in right ankle and joints of right foot  Other abnormalities of gait and mobility  Other symptoms and signs involving the musculoskeletal system  Muscle weakness (generalized)  Stiffness of right ankle, not elsewhere classified     Problem List There are no problems to display for this patient.  4:18 PM, 05/23/21 Josue Hector PT DPT  Physical Therapist with [facility]  [phone] [place] [address] [place], [place], [address] Phone: [phone]   Fax:  [phone]  Name: Bentura Mcmicken MRN: [id number] Date of Birth: 06/12/1976

## 2021-05-23 NOTE — Addendum Note (Signed)
Addended by: Josue Hector A on: 05/23/2021 09:13 AM   Modules accepted: Orders

## 2021-05-24 ENCOUNTER — Observation Stay (HOSPITAL_COMMUNITY): Payer: No Typology Code available for payment source

## 2021-05-24 ENCOUNTER — Other Ambulatory Visit: Payer: Self-pay

## 2021-05-24 ENCOUNTER — Observation Stay (HOSPITAL_BASED_OUTPATIENT_CLINIC_OR_DEPARTMENT_OTHER): Payer: No Typology Code available for payment source

## 2021-05-24 ENCOUNTER — Observation Stay (HOSPITAL_COMMUNITY)
Admission: EM | Admit: 2021-05-24 | Discharge: 2021-05-24 | Disposition: A | Discharge status: Home / Self Care | Payer: No Typology Code available for payment source | Attending: Internal Medicine | Admitting: Internal Medicine

## 2021-05-24 ENCOUNTER — Emergency Department (HOSPITAL_COMMUNITY): Payer: No Typology Code available for payment source

## 2021-05-24 ENCOUNTER — Encounter (HOSPITAL_COMMUNITY): Payer: Self-pay

## 2021-05-24 DIAGNOSIS — E1165 Type 2 diabetes mellitus with hyperglycemia: Secondary | ICD-10-CM | POA: Diagnosis present

## 2021-05-24 DIAGNOSIS — F17210 Nicotine dependence, cigarettes, uncomplicated: Secondary | ICD-10-CM | POA: Diagnosis not present

## 2021-05-24 DIAGNOSIS — Z20822 Contact with and (suspected) exposure to covid-19: Secondary | ICD-10-CM | POA: Insufficient documentation

## 2021-05-24 DIAGNOSIS — I6389 Other cerebral infarction: Secondary | ICD-10-CM

## 2021-05-24 DIAGNOSIS — R55 Syncope and collapse: Secondary | ICD-10-CM

## 2021-05-24 DIAGNOSIS — IMO0002 Reserved for concepts with insufficient information to code with codable children: Secondary | ICD-10-CM | POA: Diagnosis present

## 2021-05-24 DIAGNOSIS — Y900 Blood alcohol level of less than 20 mg/100 ml: Secondary | ICD-10-CM | POA: Diagnosis not present

## 2021-05-24 DIAGNOSIS — G459 Transient cerebral ischemic attack, unspecified: Secondary | ICD-10-CM | POA: Diagnosis present

## 2021-05-24 DIAGNOSIS — I10 Essential (primary) hypertension: Secondary | ICD-10-CM | POA: Diagnosis present

## 2021-05-24 DIAGNOSIS — I639 Cerebral infarction, unspecified: Principal | ICD-10-CM | POA: Diagnosis present

## 2021-05-24 DIAGNOSIS — E669 Obesity, unspecified: Secondary | ICD-10-CM | POA: Diagnosis present

## 2021-05-24 DIAGNOSIS — R531 Weakness: Secondary | ICD-10-CM | POA: Diagnosis present

## 2021-05-24 DIAGNOSIS — Z720 Tobacco use: Secondary | ICD-10-CM | POA: Diagnosis present

## 2021-05-24 DIAGNOSIS — E119 Type 2 diabetes mellitus without complications: Secondary | ICD-10-CM | POA: Diagnosis present

## 2021-05-24 LAB — LIPID PANEL
Cholesterol: 190 mg/dL (ref 0–200)
HDL: 38 mg/dL — ABNORMAL LOW (ref >40)
LDL Cholesterol: 100 mg/dL — ABNORMAL HIGH (ref 0–99)
Total CHOL/HDL Ratio: 5 RATIO
Triglycerides: 258 mg/dL — ABNORMAL HIGH (ref <150)
VLDL: 52 mg/dL — ABNORMAL HIGH (ref 0–40)

## 2021-05-24 LAB — CBC
HCT: 44.7 % (ref 39.0–52.0)
Hemoglobin: 14.8 g/dL (ref 13.0–17.0)
MCH: 30.3 pg (ref 26.0–34.0)
MCHC: 33.1 g/dL (ref 30.0–36.0)
MCV: 91.6 fL (ref 80.0–100.0)
Platelets: 358 10*3/uL (ref 150–400)
RBC: 4.88 MIL/uL (ref 4.22–5.81)
RDW: 14.5 % (ref 11.5–15.5)
WBC: 15.5 10*3/uL — ABNORMAL HIGH (ref 4.0–10.5)
nRBC: 0 % (ref 0.0–0.2)

## 2021-05-24 LAB — I-STAT CHEM 8, ED
BUN: 13 mg/dL (ref 6–20)
Calcium, Ion: 1.14 mmol/L — ABNORMAL LOW (ref 1.15–1.40)
Chloride: 101 mmol/L (ref 98–111)
Creatinine, Ser: 1 mg/dL (ref 0.61–1.24)
Glucose, Bld: 111 mg/dL — ABNORMAL HIGH (ref 70–99)
HCT: 46 % (ref 39.0–52.0)
Hemoglobin: 15.6 g/dL (ref 13.0–17.0)
Potassium: 3.5 mmol/L (ref 3.5–5.1)
Sodium: 142 mmol/L (ref 135–145)
TCO2: 29 mmol/L (ref 22–32)

## 2021-05-24 LAB — RAPID URINE DRUG SCREEN, HOSP PERFORMED
Amphetamines: NOT DETECTED
Barbiturates: NOT DETECTED
Benzodiazepines: NOT DETECTED
Cocaine: NOT DETECTED
Opiates: NOT DETECTED
Tetrahydrocannabinol: NOT DETECTED

## 2021-05-24 LAB — ECHOCARDIOGRAM COMPLETE
AR max vel: 3.11 cm2
AV Area VTI: 3.11 cm2
AV Area mean vel: 2.82 cm2
AV Mean grad: 4 mmHg
AV Peak grad: 8.2 mmHg
Ao pk vel: 1.43 m/s
Area-P 1/2: 4.41 cm2
MV VTI: 2.95 cm2
S' Lateral: 3 cm

## 2021-05-24 LAB — URINALYSIS, ROUTINE W REFLEX MICROSCOPIC
Bilirubin Urine: NEGATIVE
Glucose, UA: 50 mg/dL — AB
Hgb urine dipstick: NEGATIVE
Ketones, ur: NEGATIVE mg/dL
Leukocytes,Ua: NEGATIVE
Nitrite: NEGATIVE
Protein, ur: NEGATIVE mg/dL
Specific Gravity, Urine: 1.029 (ref 1.005–1.030)
pH: 7 (ref 5.0–8.0)

## 2021-05-24 LAB — DIFFERENTIAL
Abs Immature Granulocytes: 0.05 10*3/uL (ref 0.00–0.07)
Basophils Absolute: 0.1 10*3/uL (ref 0.0–0.1)
Basophils Relative: 1 %
Eosinophils Absolute: 0.3 10*3/uL (ref 0.0–0.5)
Eosinophils Relative: 2 %
Immature Granulocytes: 0 %
Lymphocytes Relative: 37 %
Lymphs Abs: 5.6 10*3/uL — ABNORMAL HIGH (ref 0.7–4.0)
Monocytes Absolute: 1 10*3/uL (ref 0.1–1.0)
Monocytes Relative: 7 %
Neutro Abs: 8.4 10*3/uL — ABNORMAL HIGH (ref 1.7–7.7)
Neutrophils Relative %: 53 %

## 2021-05-24 LAB — COMPREHENSIVE METABOLIC PANEL
ALT: 33 U/L (ref 0–44)
AST: 24 U/L (ref 15–41)
Albumin: 4.3 g/dL (ref 3.5–5.0)
Alkaline Phosphatase: 77 U/L (ref 38–126)
Anion gap: 8 (ref 5–15)
BUN: 14 mg/dL (ref 6–20)
CO2: 29 mmol/L (ref 22–32)
Calcium: 9.1 mg/dL (ref 8.9–10.3)
Chloride: 103 mmol/L (ref 98–111)
Creatinine, Ser: 0.99 mg/dL (ref 0.61–1.24)
GFR, Estimated: >60 mL/min (ref >60)
Glucose, Bld: 114 mg/dL — ABNORMAL HIGH (ref 70–99)
Potassium: 3.4 mmol/L — ABNORMAL LOW (ref 3.5–5.1)
Sodium: 140 mmol/L (ref 135–145)
Total Bilirubin: 0.4 mg/dL (ref 0.3–1.2)
Total Protein: 7.8 g/dL (ref 6.5–8.1)

## 2021-05-24 LAB — PROTIME-INR
INR: 1 (ref 0.8–1.2)
Prothrombin Time: 13.2 seconds (ref 11.4–15.2)

## 2021-05-24 LAB — RESP PANEL BY RT-PCR (FLU A&B, COVID) ARPGX2
Influenza A by PCR: NEGATIVE
Influenza B by PCR: NEGATIVE
SARS Coronavirus 2 by RT PCR: NEGATIVE

## 2021-05-24 LAB — APTT: aPTT: 33 seconds (ref 24–36)

## 2021-05-24 LAB — HIV ANTIBODY (ROUTINE TESTING W REFLEX): HIV Screen 4th Generation wRfx: NONREACTIVE

## 2021-05-24 LAB — ETHANOL: Alcohol, Ethyl (B): <10 mg/dL (ref <10)

## 2021-05-24 LAB — CBG MONITORING, ED: Glucose-Capillary: 112 mg/dL — ABNORMAL HIGH (ref 70–99)

## 2021-05-24 MED ORDER — ACETAMINOPHEN 325 MG PO TABS: 650.0000 mg | ORAL_TABLET | Freq: Four times a day (QID) | ORAL | 2 refills | Status: DC | PRN

## 2021-05-24 MED ORDER — DIPHENHYDRAMINE HCL 50 MG/ML IJ SOLN
25.0000 mg | Freq: Once | INTRAMUSCULAR | Status: AC
  Administered 2021-05-24: 25 mg via INTRAVENOUS
  Filled 2021-05-24: qty 1

## 2021-05-24 MED ORDER — ASPIRIN 81 MG PO CHEW
81.0000 mg | CHEWABLE_TABLET | Freq: Once | ORAL | Status: AC
  Administered 2021-05-24: 81 mg via ORAL
  Filled 2021-05-24: qty 1

## 2021-05-24 MED ORDER — CLOPIDOGREL BISULFATE 75 MG PO TABS
300.0000 mg | ORAL_TABLET | Freq: Once | ORAL | Status: AC
  Administered 2021-05-24: 300 mg via ORAL
  Filled 2021-05-24: qty 4

## 2021-05-24 MED ORDER — AMLODIPINE BESYLATE 10 MG PO TABS: 10.0000 mg | ORAL_TABLET | Freq: Every day | ORAL | 11 refills | Status: AC

## 2021-05-24 MED ORDER — POLYETHYLENE GLYCOL 3350 17 G PO PACK
17.0000 g | PACK | Freq: Every day | ORAL | Status: DC | PRN
Start: 2021-05-24 — End: 2021-05-24

## 2021-05-24 MED ORDER — NICOTINE 21 MG/24HR TD PT24: 21.0000 mg | MEDICATED_PATCH | TRANSDERMAL | 0 refills | Status: AC

## 2021-05-24 MED ORDER — ONDANSETRON HCL 4 MG PO TABS: 4.0000 mg | ORAL_TABLET | Freq: Four times a day (QID) | ORAL | Status: DC | PRN

## 2021-05-24 MED ORDER — SODIUM CHLORIDE 0.9 % IV SOLN: 250.0000 mL | INTRAVENOUS | Status: DC | PRN

## 2021-05-24 MED ORDER — ONDANSETRON HCL 4 MG/2ML IJ SOLN: 4.0000 mg | Freq: Four times a day (QID) | INTRAMUSCULAR | Status: DC | PRN

## 2021-05-24 MED ORDER — ASPIRIN EC 325 MG PO TBEC: 325.0000 mg | DELAYED_RELEASE_TABLET | Freq: Every day | ORAL | 12 refills | Status: AC

## 2021-05-24 MED ORDER — ATORVASTATIN CALCIUM 40 MG PO TABS
40.0000 mg | ORAL_TABLET | Freq: Every day | ORAL | Status: DC
  Administered 2021-05-24: 40 mg via ORAL
  Filled 2021-05-24: qty 1

## 2021-05-24 MED ORDER — ACETAMINOPHEN 650 MG RE SUPP: 650.0000 mg | Freq: Four times a day (QID) | RECTAL | Status: DC | PRN

## 2021-05-24 MED ORDER — AMLODIPINE BESYLATE 5 MG PO TABS
10.0000 mg | ORAL_TABLET | Freq: Once | ORAL | Status: AC
  Administered 2021-05-24: 10 mg via ORAL
  Filled 2021-05-24: qty 2

## 2021-05-24 MED ORDER — ATORVASTATIN CALCIUM 40 MG PO TABS: 40.0000 mg | ORAL_TABLET | Freq: Every day | ORAL | 3 refills | Status: DC

## 2021-05-24 MED ORDER — BUPROPION HCL ER (SR) 150 MG PO TB12: 150.0000 mg | ORAL_TABLET | Freq: Two times a day (BID) | ORAL | 5 refills | Status: DC

## 2021-05-24 MED ORDER — BISACODYL 10 MG RE SUPP: 10.0000 mg | Freq: Every day | RECTAL | Status: DC | PRN

## 2021-05-24 MED ORDER — CLOPIDOGREL BISULFATE 75 MG PO TABS
75.0000 mg | ORAL_TABLET | Freq: Once | ORAL | Status: AC
  Administered 2021-05-24: 75 mg via ORAL
  Filled 2021-05-24: qty 1

## 2021-05-24 MED ORDER — ENOXAPARIN SODIUM 40 MG/0.4ML IJ SOSY: 40.0000 mg | PREFILLED_SYRINGE | INTRAMUSCULAR | Status: DC

## 2021-05-24 MED ORDER — ADULT MULTIVITAMIN W/MINERALS CH
1.0000 | ORAL_TABLET | Freq: Every day | ORAL | Status: DC
  Administered 2021-05-24: 1 via ORAL
  Filled 2021-05-24: qty 1

## 2021-05-24 MED ORDER — ASPIRIN EC 81 MG PO TBEC
81.0000 mg | DELAYED_RELEASE_TABLET | Freq: Every day | ORAL | Status: DC
  Administered 2021-05-24: 81 mg via ORAL
  Filled 2021-05-24: qty 1

## 2021-05-24 MED ORDER — SODIUM CHLORIDE 0.9% FLUSH: 3.0000 mL | INTRAVENOUS | Status: DC | PRN

## 2021-05-24 MED ORDER — ACETAMINOPHEN 325 MG PO TABS: 650.0000 mg | ORAL_TABLET | Freq: Four times a day (QID) | ORAL | Status: DC | PRN

## 2021-05-24 MED ORDER — SODIUM CHLORIDE 0.9% FLUSH: 3.0000 mL | Freq: Two times a day (BID) | INTRAVENOUS | Status: DC

## 2021-05-24 MED ORDER — ALTEPLASE 100 MG IV SOLR
INTRAVENOUS | Status: AC
  Filled 2021-05-24: qty 100

## 2021-05-24 MED ORDER — IOHEXOL 350 MG/ML SOLN
100.0000 mL | Freq: Once | INTRAVENOUS | Status: AC | PRN
  Administered 2021-05-24: 100 mL via INTRAVENOUS

## 2021-05-24 NOTE — Progress Notes (Signed)
CODE STROKE  CALL TIME   3:03AM BEEPER   3:04AM EXAM START   3:06AM EXAM FINISHED  3:08AM SOC    3:09AM Epic    3:09AM RADIOLOGIST CALLED 3:12AM

## 2021-05-24 NOTE — Progress Notes (Signed)
OT Screen Note  Patient Details Name: Mark Anthony MRN: [id number] DOB: 08-28-1976   Cancelled Treatment:    Reason Eval/Treat Not Completed: OT screened, no needs identified, will sign off. Patient in room with Wife present. Patient reports that previous deficits have resolved. Strength assessed at 5/5 in BUE, No coordination issues, no vision issues, and A/ROM is WNL. Patient reports no concerns upon returning home. No follow up OT services warranted at this time. Thank you for the referral.     Ailene Ravel, OTR/L,CBIS  [phone]  05/24/2021, 10:32 AM

## 2021-05-24 NOTE — Consult Note (Signed)
Patient Demographics:    Mark Anthony, is a 45 y.o. male  MRN: [id number]   DOB - 1976-04-27  Admit Date - 05/24/2021  Outpatient Primary MD for the patient is Pcp, No   Assessment & Plan:    Principal Problem:   TIA (transient ischemic attack)/Mini-Stroke Active Problems:   Tobacco abuse   Acute left-sided weakness-- TIA   Diabetes mellitus type 2, uncontrolled (HCC)   Obesity   HTN (hypertension)   1)TIA--- left-sided weakness resolved, please see OT and speech therapist notes -As per patient's wife who is at bedside patient appears back to baseline at this time -From ED records it appears patient had left-sided droop and dysarthria as well as left upper extremity and left lower extremity weakness when he first arrived the ED -At the time of my evaluation patient did not have any left-sided droop, speech was normal as confirmed by patient's wife, and left-sided weakness had resolved during my evaluation -Teleneurology consult noted -Patient did not want tPA because his symptoms were spontaneously resolving, patient did receive aspirin and Plavix, as well as Lipitor -CT head without acute findings -In the ED carotid artery Dopplers without hemodynamically significant stenosis -MRI brain without acute stroke it does show 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 -CTA head without LVO -Echocardiogram with preserved EF of 60 to 65% without regional wall motion abnormalities or diastolic dysfunction -After prolonged observation in the ED patient was discharged home in stable neuro and overall medical condition with wife at bedside -LDL is 100, HDL is 38 with triglycerides of 258 -Discharge on aspirin and Lipitor -Stop meloxicam  2) tobacco abuse--- smoking cessation strongly advised okay to  use nicotine patch and Wellbutrin to help him quit smoking  3)HTN-BP is not at goal amlodipine as prescribed  4) class II obesity- -Low calorie diet, portion control and increase physical activity discussed with patient  4)DM2- A1c 7.0, lifestyle modifications advised patient to follow-up with PCP at the [facility] clinic most likely will benefit from metformin -Hold off on starting metformin at this time given contrast studies that were done today  --Follow-up with PCP Dr Duke Salvia at [place] [place] clinic  Disposition-- Dc home today with wife -1)STop Meloxicam ----Avoid ibuprofen/Advil/Aleve/Motrin/Goody Powders/Naproxen/BC powders/Meloxicam/Diclofenac/Indomethacin and other Nonsteroidal anti-inflammatory medications as these will make you more likely to bleed and can cause stomach ulcers, can also cause Kidney problems.   2)Strongly advised to Quit smoking  to reduce your risk of stroke--- you may use over-the-counter nicotine patch or Wellbutrin as discussed to help you quit smoking  3)Please take  Aspirin  daily with food and also take atorvastatin/Lipitor to reduce your risk for stroke and heart attacks  4) increase activity level/regular exercise and weight loss will be beneficial as well  5) please follow-up with your regular primary care physician within a week for recheck and reevaluation   Allergies as of 05/24/2021       Reactions  Contrast Media [iodinated Diagnostic Agents] Nausea And Vomiting, Swelling   Pt. Vomited immediately after IV dye injection.  Pt. Then had throat swelling and edema to uvula.     Acetaminophen    Other reaction(s): Sweating   Vicodin [hydrocodone-acetaminophen] Nausea And Vomiting        Medication List     STOP taking these medications    docusate sodium 100 MG capsule Commonly known as: Colace   meloxicam 15 MG tablet Commonly known as: MOBIC   ondansetron 4 MG tablet Commonly known as: Zofran   rivaroxaban 10 MG Tabs tablet Commonly  known as: Xarelto   senna 8.6 MG Tabs tablet Commonly known as: SENOKOT       TAKE these medications    acetaminophen 325 MG tablet Commonly known as: TYLENOL Take 2 tablets (650 mg total) by mouth every 6 (six) hours as needed for mild pain or headache (or Fever >/= 101).   amLODipine 10 MG tablet Commonly known as: NORVASC Take 1 tablet (10 mg total) by mouth daily.   aspirin EC 325 MG tablet Take 1 tablet (325 mg total) by mouth daily with breakfast.   atorvastatin 40 MG tablet Commonly known as: LIPITOR Take 1 tablet (40 mg total) by mouth daily. Start taking on: May 25, 2021   buPROPion 150 MG 12 hr tablet Commonly known as: Wellbutrin SR Take 1 tablet (150 mg total) by mouth 2 (two) times daily.   nicotine 21 mg/24hr patch Commonly known as: NICODERM CQ - dosed in mg/24 hours Place 1 patch (21 mg total) onto the skin daily.        Dispo: The patient is from: Home              Anticipated d/c is to: Home               With History of - Reviewed by me  Past Medical History:  Diagnosis Date   Hypertension    no meds at this time.   Reflux    TIA (transient ischemic attack)    no meds following      Past Surgical History:  Procedure Laterality Date   ACHILLES TENDON SURGERY Right 02/02/2020   Procedure: Right Achilles tendon debridement and reconstruction;  Surgeon: Wylene Simmer, MD;  Location: [place];  Service: Orthopedics;  Laterality: Right;   ACHILLES TENDON SURGERY Right 12/13/2020   Procedure: Right Achilles Tendon Reconstruction;  Surgeon: Wylene Simmer, MD;  Location: [place];  Service: Orthopedics;  Laterality: Right;   ELBOW FRACTURE SURGERY     GASTROC RECESSION EXTREMITY Right 12/13/2020   Procedure: Gastroc Recession;  Surgeon: Wylene Simmer, MD;  Location: [place];  Service: Orthopedics;  Laterality: Right;     Chief Complaint  Patient presents with   Weakness    Left side        HPI:    Mark Anthony  is a 45 y.o. male smoker with history of hypertension, ongoing tobacco abuse and history of prior TIA over 3 years ago presents to the ED with left-sided weakness started around 3 AM  -Patient denies chest pains palpitations dizziness or visual disturbance -No headache no neck pain no nausea no vomiting no diarrhea no shortness of breath no cough, no fevers -Additional history obtained from patient's wife at bedside -at the time of my evaluation patient and patient's wife both said that left-sided weakness has mostly resolved -OT and speech therapist also noted that left-sided  weakness has mostly resolved please see separate documentation from OT and speech therapist -From ED records it appears patient had left-sided droop and dysarthria as well as left upper extremity and left lower extremity weakness when he first arrived the ED -At the time of my evaluation patient did not have any left-sided droop, speech was normal as confirmed by patient's wife, and left-sided weakness had resolved during my evaluation -Teleneurology consult noted -Patient did not want tPA because his symptoms were spontaneously resolving, patient did receive aspirin and Plavix, as well as Lipitor -CT head without acute findings -In the ED carotid artery Dopplers without hemodynamically significant stenosis -MRI brain without acute stroke it does show 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    Social History:  Reviewed by me    Social  History   Tobacco Use   Smoking status: Every Day    Packs/day: 0.25    Types: Cigarettes   Smokeless tobacco: Never  Substance Use Topics   Alcohol use: Yes    Comment: occasional       Family History :  Reviewed by me  HTN   Home Medications:   Prior to Admission medications   Medication Sig Start Date End Date Taking? Authorizing Provider  amLODipine (NORVASC) 10 MG tablet Take 1 tablet (10 mg total) by mouth daily. 05/24/21 05/24/22 Yes Roxan Hockey, MD  aspirin EC 325 MG tablet Take 1 tablet (325 mg total) by mouth daily with breakfast. 05/24/21 05/24/22 Yes Lilyona Richner, MD  buPROPion (WELLBUTRIN SR) 150 MG 12 hr tablet Take 1 tablet (150 mg total) by mouth 2 (two) times daily. 05/24/21 05/24/22 Yes Myquan Schaumburg, MD  nicotine (NICODERM CQ - DOSED IN MG/24 HOURS) 21 mg/24hr patch Place 1 patch (21 mg total) onto the skin daily. 05/24/21 06/23/21 Yes Roxan Hockey, MD  acetaminophen (TYLENOL) 325 MG tablet Take 2 tablets (650 mg total) by mouth every 6 (six) hours as needed for mild pain or headache (or Fever >/= 101). 05/24/21   Nychelle Cassata, MD  atorvastatin (LIPITOR) 40 MG tablet Take 1 tablet (40 mg total) by mouth daily. 05/25/21   Roxan Hockey, MD     Allergies:     Allergies  Allergen Reactions   Contrast Media [Iodinated Diagnostic Agents] Nausea And Vomiting and Swelling    Pt. Vomited immediately after IV dye injection.  Pt. Then had throat swelling and edema to uvula.     Acetaminophen     Other reaction(s): Sweating   Vicodin [Hydrocodone-Acetaminophen] Nausea And Vomiting     Physical Exam:   Vitals  Blood pressure (!) 159/90, pulse 73, temperature 98 F (36.7 C), temperature source Oral, resp. rate 18, SpO2 95 %.  Physical Examination: General appearance - alert, and in no distress  Mental status - alert, oriented to person, place, and time,  Eyes - sclera anicteric Neck - supple, no JVD elevation , Chest - clear  to auscultation  bilaterally, symmetrical air movement,  Heart - S1 and S2 normal, regular  Abdomen -  soft, nontender, nondistended, no masses or organomegaly Neurological - screening mental status exam normal, neck supple without rigidity, cranial nerves II through XII intact, DTR's normal and symmetric Extremities - no pedal edema noted, intact peripheral pulses  Skin - warm, dry   Data Review:    CBC Recent Labs  Lab 05/24/21 0303 05/24/21 0311  WBC 15.5*  --   HGB 14.8 15.6  HCT 44.7 46.0  PLT 358  --   MCV 91.6  --   MCH 30.3  --   MCHC 33.1  --   RDW 14.5  --   LYMPHSABS 5.6*  --   MONOABS 1.0  --   EOSABS 0.3  --   BASOSABS 0.1  --    ------------------------------------------------------------------------------------------------------------------  Chemistries  Recent Labs  Lab 05/24/21 0303 05/24/21 0311  NA 140 142  K 3.4* 3.5  CL 103 101  CO2 29  --   GLUCOSE 114* 111*  BUN 14 13  CREATININE 0.99 1.00  CALCIUM 9.1  --   AST 24  --   ALT 33  --   ALKPHOS 77  --   BILITOT 0.4  --    ------------------------------------------------------------------------------------------------------------------ CrCl cannot be calculated (Unknown ideal weight.). ------------------------------------------------------------------------------------------------------------------ No results for input(s): TSH, T4TOTAL, T3FREE, THYROIDAB in the last 72 hours.  Invalid input(s): FREET3   Coagulation profile Recent Labs  Lab 05/24/21 0303  INR 1.0   ------------------------------------------------------------------------------------------------------------------- No results for input(s): DDIMER in the last 72 hours. -------------------------------------------------------------------------------------------------------------------  Cardiac Enzymes No results for input(s): CKMB, TROPONINI, MYOGLOBIN in the last 168 hours.  Invalid input(s):  CK ------------------------------------------------------------------------------------------------------------------ No results found for: BNP   ---------------------------------------------------------------------------------------------------------------  Urinalysis    Component Value Date/Time   COLORURINE STRAW (A) 05/24/2021 0303   APPEARANCEUR CLEAR 05/24/2021 0303   LABSPEC 1.029 05/24/2021 0303   PHURINE 7.0 05/24/2021 0303   GLUCOSEU 50 (A) 05/24/2021 0303   HGBUR NEGATIVE 05/24/2021 0303   BILIRUBINUR NEGATIVE 05/24/2021 0303   KETONESUR NEGATIVE 05/24/2021 0303   PROTEINUR NEGATIVE 05/24/2021 0303   NITRITE NEGATIVE 05/24/2021 0303   LEUKOCYTESUR NEGATIVE 05/24/2021 0303    ----------------------------------------------------------------------------------------------------------------   Imaging Results:    CT ANGIO HEAD NECK W WO CM  Result Date: 05/24/2021 CLINICAL DATA:  Initial evaluation for neuro deficit, stroke, left-sided weakness. EXAM: CT ANGIOGRAPHY HEAD AND NECK TECHNIQUE: Multidetector CT imaging of the head and neck was performed using the standard protocol during bolus administration of intravenous contrast. Multiplanar CT image reconstructions and MIPs were obtained to evaluate the vascular anatomy. Carotid stenosis measurements (when applicable) are obtained utilizing NASCET criteria, using the distal internal carotid diameter as the denominator. CONTRAST:  15m OMNIPAQUE IOHEXOL 350 MG/ML SOLN COMPARISON:  Prior head CT from earlier the same day. FINDINGS: CTA NECK FINDINGS Aortic arch: Examination severely limited due to poor timing of the contrast bolus, with little contrast within the arterial system. Additionally, evaluation somewhat limited by habitus. Visualized aortic arch normal in caliber with normal branch pattern. Minimal plaque within the arch itself. No stenosis about the origin of the great vessels. Right carotid system: Right common and  internal carotid arteries grossly patent to the skull base without visible stenosis or occlusion. No obvious dissection or other acute vascular abnormality. Left carotid system: Left common and internal carotid arteries patent without visible stenosis or occlusion. No obvious dissection or other acute vascular abnormality. Vertebral arteries: Both vertebral arteries arise from the subclavian arteries. No proximal subclavian artery stenosis. Vertebral arteries grossly patent within the neck without  visible stenosis or occlusion. No obvious dissection or other abnormality. Osteophytic overgrowth at the left C6 transverse foramen with mild mass effect on the adjacent left V2 segment noted (series 4, image 65). Skeleton: No visible acute osseous finding. No discrete or worrisome osseous lesions. Other neck: No other acute soft tissue abnormality within the neck. No mass or adenopathy. Upper chest: Visualized upper chest demonstrates no acute finding. Review of the MIP images confirms the above findings CTA HEAD FINDINGS Anterior circulation: Evaluation of the intracranial circulation severely limited with little contrast within the arterial system. ICAs grossly patent to the termini without visible stenosis. Left A1 grossly patent. Suspected hypoplastic and/or absent right A1. Grossly negative anterior communicating artery complex. ACAs grossly perfused bilaterally. M1 segments grossly patent without visible stenosis. Grossly negative MCA bifurcations. No visible proximal MCA branch occlusion, although evaluation of the MCA branches is fairly limited on this exam. Posterior circulation: Both V4 segments patent to the vertebrobasilar junction without visible stenosis. Both PICA origins patent. 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 Basilar patent to its distal aspect without visible stenosis superior cerebral arteries patent at their origins. Both PCAs appear to be primarily supplied via the  basilar. PCAs grossly perfused bilaterally without visible stenosis. Venous sinuses: Grossly patent allowing for timing the contrast bolus. Anatomic variants: Probable hypoplastic/absent right A1 segment. Review of the MIP images confirms the above findings IMPRESSION: 1. Severely limited exam due to poor timing of the contrast bolus. 2. Grossly negative CTA of the head and neck. No visible large vessel occlusion or hemodynamically significant stenosis. No other obvious acute vascular abnormality. Electronically Signed   By: Jeannine Boga M.D.   On: 05/24/2021 04:25   MR BRAIN WO CONTRAST  Result Date: 05/24/2021 CLINICAL DATA:  Neuro deficit, acute, stroke suspected; left-sided weakness with left facial droop EXAM: MRI HEAD WITHOUT CONTRAST TECHNIQUE: Multiplanar, multiecho pulse sequences of the brain and surrounding structures were obtained without intravenous contrast. COMPARISON:  Correlation is made with recent CT imaging FINDINGS: Brain: There is no acute infarction or intracranial hemorrhage. There is no intracranial mass, mass effect, or edema. There is no hydrocephalus or extra-axial fluid collection. Ventricles and sulci are normal in size and configuration. Minimal small foci of T2 hyperintensity in the supratentorial white matter likely reflect nonspecific gliosis/demyelination of doubtful clinical significance. 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 Vascular: Major vessel flow voids at the skull base are preserved. Skull and upper cervical spine: Normal marrow signal is preserved. Sinuses/Orbits: Minor mucosal thickening.  Orbits are unremarkable. Other: Sella is unremarkable.  Mastoid air cells are clear. IMPRESSION: No acute infarction, hemorrhage, or mass. 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 Electronically Signed   By: Macy Mis M.D.   On: 05/24/2021 08:45   US Carotid Bilateral  Result Date: 05/24/2021 CLINICAL DATA:  45 year old  male with syncope EXAM: BILATERAL CAROTID DUPLEX ULTRASOUND TECHNIQUE: Pearline Cables scale imaging, color Doppler and duplex ultrasound were performed of bilateral carotid and vertebral arteries in the neck. COMPARISON:  None. FINDINGS: Criteria: Quantification of carotid stenosis is based on velocity parameters that correlate the residual internal carotid diameter with NASCET-based stenosis levels, using the diameter of the distal internal carotid lumen as the denominator for stenosis measurement. The following velocity measurements were obtained: RIGHT ICA: 103/31 cm/sec CCA: [id number] cm/sec SYSTOLIC ICA/CCA RATIO:  1.1 ECA:  118 cm/sec LEFT ICA: 91/25 cm/sec CCA: [id number] cm/sec SYSTOLIC ICA/CCA RATIO:  0.7 ECA:  124 cm/sec RIGHT CAROTID ARTERY: No  significant atherosclerotic plaque or evidence of stenosis in the internal carotid artery. RIGHT VERTEBRAL ARTERY:  Patent with normal antegrade flow. LEFT CAROTID ARTERY: No significant atherosclerotic plaque or stenosis in the internal carotid artery. LEFT VERTEBRAL ARTERY:  Patent with normal antegrade flow. IMPRESSION: 1. No significant atherosclerotic plaque or stenosis in the internal carotid arteries. 2. Vertebral arteries are patent with normal antegrade flow. Electronically Signed   By: Jacqulynn Cadet M.D.   On: 05/24/2021 15:31   ECHOCARDIOGRAM COMPLETE  Result Date: 05/24/2021    ECHOCARDIOGRAM REPORT   Patient Name:   Mark Anthony Date of Exam: 05/24/2021 Medical Rec #:  [id number]  Height:       73.0 in Accession #:    [id number] Weight:       287.5 lb Date of Birth:  02-Feb-1976  BSA:          2.510 m Patient Age:    63 years   BP:           152/95 mmHg Patient Gender: M          HR:           72 bpm. Exam Location:  [facility] Procedure: 2D Echo, Cardiac Doppler and Color Doppler Indications:   Stroke  History:       Patient has no prior history of Echocardiogram examinations.                Stroke.  Sonographer:   Wenda Low Referring      [place] Phys: IMPRESSIONS  1. Left ventricular ejection fraction, by estimation, is 60 to 65%. The left ventricle has normal function. The left ventricle has no regional wall motion abnormalities. There is mild left ventricular hypertrophy. Left ventricular diastolic parameters were normal.  2. Right ventricular systolic function is normal. The right ventricular size is normal. Tricuspid regurgitation signal is inadequate for assessing PA pressure.  3. The mitral valve is normal in structure. No evidence of mitral valve regurgitation. No evidence of mitral stenosis.  4. The aortic valve is tricuspid. There is mild calcification of the aortic valve. There is mild thickening of the aortic valve. Aortic valve regurgitation is not visualized. No aortic stenosis is present.  5. The inferior vena cava is normal in size with greater than 50% respiratory variability, suggesting right atrial pressure of 3 mmHg. FINDINGS  Left Ventricle: Left ventricular ejection fraction, by estimation, is 60 to 65%. The left ventricle has normal function. The left ventricle has no regional wall motion abnormalities. The left ventricular internal cavity size was normal in size. There is  mild left ventricular hypertrophy. Left ventricular diastolic parameters were normal. Right Ventricle: The right ventricular size is normal. No increase in right ventricular wall thickness. Right ventricular systolic function is normal. Tricuspid regurgitation signal is inadequate for assessing PA pressure. Left Atrium: Left atrial size was normal in size. Right Atrium: Right atrial size was normal in size. Pericardium: There is no evidence of pericardial effusion. Mitral Valve: The mitral valve is normal in structure. No evidence of mitral valve regurgitation. No evidence of mitral valve stenosis. MV peak gradient, 3.2 mmHg. The mean mitral valve gradient is 1.0 mmHg. Tricuspid Valve: The tricuspid valve is normal in structure. Tricuspid valve regurgitation  is not demonstrated. No evidence of tricuspid stenosis. Aortic Valve: The aortic valve is tricuspid. There is mild calcification of the aortic valve. There is mild thickening of the aortic valve. Aortic valve regurgitation is not visualized. No aortic  stenosis is present. Aortic valve mean gradient measures 4.0 mmHg. Aortic valve peak gradient measures 8.2 mmHg. Aortic valve area, by VTI measures 3.11 cm. Pulmonic Valve: The pulmonic valve was not well visualized. Pulmonic valve regurgitation is not visualized. No evidence of pulmonic stenosis. Aorta: The aortic root is normal in size and structure. Venous: The inferior vena cava is normal in size with greater than 50% respiratory variability, suggesting right atrial pressure of 3 mmHg. IAS/Shunts: No atrial level shunt detected by color flow Doppler.  LEFT VENTRICLE PLAX 2D LVIDd:         4.75 cm  Diastology LVIDs:         3.00 cm  LV e' medial:    8.24 cm/s LV PW:         1.22 cm  LV E/e' medial:  10.1 LV IVS:        1.26 cm  LV e' lateral:   11.90 cm/s LVOT diam:     2.30 cm  LV E/e' lateral: 7.0 LV SV:         99 LV SV Index:   40 LVOT Area:     4.15 cm  RIGHT VENTRICLE RV Basal diam:  3.94 cm RV Mid diam:    3.56 cm TAPSE (M-mode): 2.7 cm LEFT ATRIUM             Index       RIGHT ATRIUM           Index LA diam:        4.00 cm 1.59 cm/m  RA Area:     18.40 cm LA Vol (A2C):   75.5 ml 30.08 ml/m RA Volume:   50.70 ml  20.20 ml/m LA Vol (A4C):   74.8 ml 29.80 ml/m LA Biplane Vol: 74.8 ml 29.80 ml/m  AORTIC VALVE AV Area (Vmax):    3.11 cm AV Area (Vmean):   2.82 cm AV Area (VTI):     3.11 cm AV Vmax:           143.00 cm/s AV Vmean:          98.300 cm/s AV VTI:            0.319 m AV Peak Grad:      8.2 mmHg AV Mean Grad:      4.0 mmHg LVOT Vmax:         107.00 cm/s LVOT Vmean:        66.700 cm/s LVOT VTI:          0.239 m LVOT/AV VTI ratio: 0.75  AORTA Ao Root diam: 3.20 cm MITRAL VALVE MV Area (PHT): 4.41 cm    SHUNTS MV Area VTI:   2.95 cm    Systemic  VTI:  0.24 m MV Peak grad:  3.2 mmHg    Systemic Diam: 2.30 cm MV Mean grad:  1.0 mmHg MV Vmax:       0.89 m/s MV Vmean:      48.0 cm/s MV Decel Time: 172 msec MV E velocity: 83.10 cm/s MV A velocity: 74.10 cm/s MV E/A ratio:  1.12 Carlyle Dolly MD Electronically signed by Carlyle Dolly MD Signature Date/Time: 05/24/2021/1:07:49 PM    Final    CT HEAD CODE STROKE WO CONTRAST  Result Date: 05/24/2021 CLINICAL DATA:  Code stroke. Initial evaluation for acute left-sided weakness. 6 head cycle concern cough radiology stridor HS o'clock EXAM: CT HEAD WITHOUT CONTRAST TECHNIQUE: Contiguous axial images were obtained from the base of the skull through  the vertex without intravenous contrast. COMPARISON:  None. FINDINGS: Brain: Cerebral volume within normal limits for patient age. No evidence for acute intracranial hemorrhage. No findings to suggest acute large vessel territory infarct. No mass lesion, midline shift, or mass effect. Ventricles are normal in size without evidence for hydrocephalus. No extra-axial fluid collection identified. Vascular: No hyperdense vessel identified. Skull: Scalp soft tissues demonstrate no acute abnormality. Calvarium intact. Sinuses/Orbits: Globes and orbital soft tissues within normal limits. Visualized paranasal sinuses are clear. No mastoid effusion. ASPECTS [facility] Stroke Program Early CT Score) - Ganglionic level infarction (caudate, lentiform nuclei, internal capsule, insula, M1-M3 cortex): 7 - Supraganglionic infarction (M4-M6 cortex): 3 Total score (0-10 with 10 being normal): 10 IMPRESSION: 1. Negative head CT.  No acute intracranial abnormality. 2. ASPECTS is 10. Critical Value/emergent results were called by telephone at the time of interpretation on 05/24/2021 at 3:28 am to provider Ripley Fraise , who verbally acknowledged these results. Electronically Signed   By: Jeannine Boga M.D.   On: 05/24/2021 03:30    Radiological Exams on Admission: CT ANGIO HEAD  NECK W WO CM  Result Date: 05/24/2021 CLINICAL DATA:  Initial evaluation for neuro deficit, stroke, left-sided weakness. EXAM: CT ANGIOGRAPHY HEAD AND NECK TECHNIQUE: Multidetector CT imaging of the head and neck was performed using the standard protocol during bolus administration of intravenous contrast. Multiplanar CT image reconstructions and MIPs were obtained to evaluate the vascular anatomy. Carotid stenosis measurements (when applicable) are obtained utilizing NASCET criteria, using the distal internal carotid diameter as the denominator. CONTRAST:  153m OMNIPAQUE IOHEXOL 350 MG/ML SOLN COMPARISON:  Prior head CT from earlier the same day. FINDINGS: CTA NECK FINDINGS Aortic arch: Examination severely limited due to poor timing of the contrast bolus, with little contrast within the arterial system. Additionally, evaluation somewhat limited by habitus. Visualized aortic arch normal in caliber with normal branch pattern. Minimal plaque within the arch itself. No stenosis about the origin of the great vessels. Right carotid system: Right common and internal carotid arteries grossly patent to the skull base without visible stenosis or occlusion. No obvious dissection or other acute vascular abnormality. Left carotid system: Left common and internal carotid arteries patent without visible stenosis or occlusion. No obvious dissection or other acute vascular abnormality. Vertebral arteries: Both vertebral arteries arise from the subclavian arteries. No proximal subclavian artery stenosis. Vertebral arteries grossly patent within the neck without visible stenosis or occlusion. No obvious dissection or other abnormality. Osteophytic overgrowth at the left C6 transverse foramen with mild mass effect on the adjacent left V2 segment noted (series 4, image 65). Skeleton: No visible acute osseous finding. No discrete or worrisome osseous lesions. Other neck: No other acute soft tissue abnormality within the neck. No  mass or adenopathy. Upper chest: Visualized upper chest demonstrates no acute finding. Review of the MIP images confirms the above findings CTA HEAD FINDINGS Anterior circulation: Evaluation of the intracranial circulation severely limited with little contrast within the arterial system. ICAs grossly patent to the termini without visible stenosis. Left A1 grossly patent. Suspected hypoplastic and/or absent right A1. Grossly negative anterior communicating artery complex. ACAs grossly perfused bilaterally. M1 segments grossly patent without visible stenosis. Grossly negative MCA bifurcations. No visible proximal MCA branch occlusion, although evaluation of the MCA branches is fairly limited on this exam. Posterior circulation: Both V4 segments patent to the vertebrobasilar junction without visible stenosis. Both PICA origins patent. 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 Basilar patent to its distal aspect without  visible stenosis superior cerebral arteries patent at their origins. Both PCAs appear to be primarily supplied via the basilar. PCAs grossly perfused bilaterally without visible stenosis. Venous sinuses: Grossly patent allowing for timing the contrast bolus. Anatomic variants: Probable hypoplastic/absent right A1 segment. Review of the MIP images confirms the above findings IMPRESSION: 1. Severely limited exam due to poor timing of the contrast bolus. 2. Grossly negative CTA of the head and neck. No visible large vessel occlusion or hemodynamically significant stenosis. No other obvious acute vascular abnormality. Electronically Signed   By: Jeannine Boga M.D.   On: 05/24/2021 04:25   MR BRAIN WO CONTRAST  Result Date: 05/24/2021 CLINICAL DATA:  Neuro deficit, acute, stroke suspected; left-sided weakness with left facial droop EXAM: MRI HEAD WITHOUT CONTRAST TECHNIQUE: Multiplanar, multiecho pulse sequences of the brain and surrounding structures were obtained without  intravenous contrast. COMPARISON:  Correlation is made with recent CT imaging FINDINGS: Brain: There is no acute infarction or intracranial hemorrhage. There is no intracranial mass, mass effect, or edema. There is no hydrocephalus or extra-axial fluid collection. Ventricles and sulci are normal in size and configuration. Minimal small foci of T2 hyperintensity in the supratentorial white matter likely reflect nonspecific gliosis/demyelination of doubtful clinical significance. 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 Vascular: Major vessel flow voids at the skull base are preserved. Skull and upper cervical spine: Normal marrow signal is preserved. Sinuses/Orbits: Minor mucosal thickening.  Orbits are unremarkable. Other: Sella is unremarkable.  Mastoid air cells are clear. IMPRESSION: No acute infarction, hemorrhage, or mass. 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 Electronically Signed   By: Macy Mis M.D.   On: 05/24/2021 08:45   US Carotid Bilateral  Result Date: 05/24/2021 CLINICAL DATA:  45 year old male with syncope EXAM: BILATERAL CAROTID DUPLEX ULTRASOUND TECHNIQUE: Pearline Cables scale imaging, color Doppler and duplex ultrasound were performed of bilateral carotid and vertebral arteries in the neck. COMPARISON:  None. FINDINGS: Criteria: Quantification of carotid stenosis is based on velocity parameters that correlate the residual internal carotid diameter with NASCET-based stenosis levels, using the diameter of the distal internal carotid lumen as the denominator for stenosis measurement. The following velocity measurements were obtained: RIGHT ICA: 103/31 cm/sec CCA: [id number] cm/sec SYSTOLIC ICA/CCA RATIO:  1.1 ECA:  118 cm/sec LEFT ICA: 91/25 cm/sec CCA: [id number] cm/sec SYSTOLIC ICA/CCA RATIO:  0.7 ECA:  124 cm/sec RIGHT CAROTID ARTERY: No significant atherosclerotic plaque or evidence of stenosis in the internal carotid artery. RIGHT VERTEBRAL ARTERY:  Patent with  normal antegrade flow. LEFT CAROTID ARTERY: No significant atherosclerotic plaque or stenosis in the internal carotid artery. LEFT VERTEBRAL ARTERY:  Patent with normal antegrade flow. IMPRESSION: 1. No significant atherosclerotic plaque or stenosis in the internal carotid arteries. 2. Vertebral arteries are patent with normal antegrade flow. Electronically Signed   By: Jacqulynn Cadet M.D.   On: 05/24/2021 15:31   ECHOCARDIOGRAM COMPLETE  Result Date: 05/24/2021    ECHOCARDIOGRAM REPORT   Patient Name:   Mark Anthony Date of Exam: 05/24/2021 Medical Rec #:  [id number]  Height:       73.0 in Accession #:    [id number] Weight:       287.5 lb Date of Birth:  [birth year]  BSA:          2.510 m Patient Age:    42 years   BP:           152/95 mmHg Patient Gender: M  HR:           72 bpm. Exam Location:  [facility] Procedure: 2D Echo, Cardiac Doppler and Color Doppler Indications:   Stroke  History:       Patient has no prior history of Echocardiogram examinations.                Stroke.  Sonographer:   Wenda Low Referring      [place] Phys: IMPRESSIONS  1. Left ventricular ejection fraction, by estimation, is 60 to 65%. The left ventricle has normal function. The left ventricle has no regional wall motion abnormalities. There is mild left ventricular hypertrophy. Left ventricular diastolic parameters were normal.  2. Right ventricular systolic function is normal. The right ventricular size is normal. Tricuspid regurgitation signal is inadequate for assessing PA pressure.  3. The mitral valve is normal in structure. No evidence of mitral valve regurgitation. No evidence of mitral stenosis.  4. The aortic valve is tricuspid. There is mild calcification of the aortic valve. There is mild thickening of the aortic valve. Aortic valve regurgitation is not visualized. No aortic stenosis is present.  5. The inferior vena cava is normal in size with greater than 50% respiratory variability,  suggesting right atrial pressure of 3 mmHg. FINDINGS  Left Ventricle: Left ventricular ejection fraction, by estimation, is 60 to 65%. The left ventricle has normal function. The left ventricle has no regional wall motion abnormalities. The left ventricular internal cavity size was normal in size. There is  mild left ventricular hypertrophy. Left ventricular diastolic parameters were normal. Right Ventricle: The right ventricular size is normal. No increase in right ventricular wall thickness. Right ventricular systolic function is normal. Tricuspid regurgitation signal is inadequate for assessing PA pressure. Left Atrium: Left atrial size was normal in size. Right Atrium: Right atrial size was normal in size. Pericardium: There is no evidence of pericardial effusion. Mitral Valve: The mitral valve is normal in structure. No evidence of mitral valve regurgitation. No evidence of mitral valve stenosis. MV peak gradient, 3.2 mmHg. The mean mitral valve gradient is 1.0 mmHg. Tricuspid Valve: The tricuspid valve is normal in structure. Tricuspid valve regurgitation is not demonstrated. No evidence of tricuspid stenosis. Aortic Valve: The aortic valve is tricuspid. There is mild calcification of the aortic valve. There is mild thickening of the aortic valve. Aortic valve regurgitation is not visualized. No aortic stenosis is present. Aortic valve mean gradient measures 4.0 mmHg. Aortic valve peak gradient measures 8.2 mmHg. Aortic valve area, by VTI measures 3.11 cm. Pulmonic Valve: The pulmonic valve was not well visualized. Pulmonic valve regurgitation is not visualized. No evidence of pulmonic stenosis. Aorta: The aortic root is normal in size and structure. Venous: The inferior vena cava is normal in size with greater than 50% respiratory variability, suggesting right atrial pressure of 3 mmHg. IAS/Shunts: No atrial level shunt detected by color flow Doppler.  LEFT VENTRICLE PLAX 2D LVIDd:         4.75 cm   Diastology LVIDs:         3.00 cm  LV e' medial:    8.24 cm/s LV PW:         1.22 cm  LV E/e' medial:  10.1 LV IVS:        1.26 cm  LV e' lateral:   11.90 cm/s LVOT diam:     2.30 cm  LV E/e' lateral: 7.0 LV SV:         99 LV  SV Index:   40 LVOT Area:     4.15 cm  RIGHT VENTRICLE RV Basal diam:  3.94 cm RV Mid diam:    3.56 cm TAPSE (M-mode): 2.7 cm LEFT ATRIUM             Index       RIGHT ATRIUM           Index LA diam:        4.00 cm 1.59 cm/m  RA Area:     18.40 cm LA Vol (A2C):   75.5 ml 30.08 ml/m RA Volume:   50.70 ml  20.20 ml/m LA Vol (A4C):   74.8 ml 29.80 ml/m LA Biplane Vol: 74.8 ml 29.80 ml/m  AORTIC VALVE AV Area (Vmax):    3.11 cm AV Area (Vmean):   2.82 cm AV Area (VTI):     3.11 cm AV Vmax:           143.00 cm/s AV Vmean:          98.300 cm/s AV VTI:            0.319 m AV Peak Grad:      8.2 mmHg AV Mean Grad:      4.0 mmHg LVOT Vmax:         107.00 cm/s LVOT Vmean:        66.700 cm/s LVOT VTI:          0.239 m LVOT/AV VTI ratio: 0.75  AORTA Ao Root diam: 3.20 cm MITRAL VALVE MV Area (PHT): 4.41 cm    SHUNTS MV Area VTI:   2.95 cm    Systemic VTI:  0.24 m MV Peak grad:  3.2 mmHg    Systemic Diam: 2.30 cm MV Mean grad:  1.0 mmHg MV Vmax:       0.89 m/s MV Vmean:      48.0 cm/s MV Decel Time: 172 msec MV E velocity: 83.10 cm/s MV A velocity: 74.10 cm/s MV E/A ratio:  1.12 Carlyle Dolly MD Electronically signed by Carlyle Dolly MD Signature Date/Time: 05/24/2021/1:07:49 PM    Final    CT HEAD CODE STROKE WO CONTRAST  Result Date: 05/24/2021 CLINICAL DATA:  Code stroke. Initial evaluation for acute left-sided weakness. 6 head cycle concern cough radiology stridor HS o'clock EXAM: CT HEAD WITHOUT CONTRAST TECHNIQUE: Contiguous axial images were obtained from the base of the skull through the vertex without intravenous contrast. COMPARISON:  None. FINDINGS: Brain: Cerebral volume within normal limits for patient age. No evidence for acute intracranial hemorrhage. No findings to suggest  acute large vessel territory infarct. No mass lesion, midline shift, or mass effect. Ventricles are normal in size without evidence for hydrocephalus. No extra-axial fluid collection identified. Vascular: No hyperdense vessel identified. Skull: Scalp soft tissues demonstrate no acute abnormality. Calvarium intact. Sinuses/Orbits: Globes and orbital soft tissues within normal limits. Visualized paranasal sinuses are clear. No mastoid effusion. ASPECTS [facility] Stroke Program Early CT Score) - Ganglionic level infarction (caudate, lentiform nuclei, internal capsule, insula, M1-M3 cortex): 7 - Supraganglionic infarction (M4-M6 cortex): 3 Total score (0-10 with 10 being normal): 10 IMPRESSION: 1. Negative head CT.  No acute intracranial abnormality. 2. ASPECTS is 10. Critical Value/emergent results were called by telephone at the time of interpretation on 05/24/2021 at 3:28 am to provider Ripley Fraise , who verbally acknowledged these results. Electronically Signed   By: Jeannine Boga M.D.   On: 05/24/2021 03:30    AM Labs Ordered, also please review Full Orders  Family Communication: Admission, patients condition and plan of care including tests being ordered have been discussed with the patient and WIFE  at bedside * who indicate understanding and agree with the plan   Code Status - Full Code  Likely DC to home with wife  Condition   stable with resolved left-sided hemiparesis  Roxan Hockey M.D on 05/24/2021 at 3:59 PM Go to [email or web address] -  for contact info  [facility] - Office  [phone]

## 2021-05-24 NOTE — Progress Notes (Signed)
SLP Cancellation Note  Patient Details Name: Mark Anthony MRN: [id number] DOB: [birth year]   Cancelled treatment:       Reason Eval/Treat Not Completed: SLP screened, no needs identified, will sign off. Pt passed [facility] Stroke Swallow screen and MRI is negative for acute infarction, hemorrhage or mass. RN has initiated regular diet. Please re-order ST if needed, thank you,  Cagney Degrace H. Roddie Mc, CCC-SLP Speech Language Pathologist    Wende Bushy 05/24/2021, 9:26 AM

## 2021-05-24 NOTE — ED Provider Notes (Signed)
[facility] EMERGENCY DEPARTMENT Provider Note   CSN: [id number] Arrival date & time: 05/24/21  [id number]  An emergency department physician performed an initial assessment on this suspected stroke patient at [place].  History Chief Complaint  Patient presents with   Weakness    Left side    Level 5 caveat due to acuity of condition Mark Anthony is a 45 y.o. male.  The history is provided by the patient and the spouse. The history is limited by the condition of the patient.  Weakness Severity:  Severe Onset quality:  Sudden Duration:  1 hour Timing:  Constant Progression:  Improving Chronicity:  New Relieved by:  None tried Worsened by:  Nothing Associated symptoms: difficulty walking and stroke symptoms   Associated symptoms: no chest pain, no fever, no headaches, no loss of consciousness, no vision change and no vomiting   Patient reports sudden onset of left-sided weakness approximately 1 hour ago.  Patient reports he was watching television when he stood up he had difficulty walking.  Last known well approximately 2 AM He reports he had weakness in his left arm and left leg and difficulty speaking.  However he does feel things are improving     Past Medical History:  Diagnosis Date   Hypertension    no meds at this time.   Reflux    TIA (transient ischemic attack)    no meds following    There are no problems to display for this patient.   Past Surgical History:  Procedure Laterality Date   ACHILLES TENDON SURGERY Right 02/02/2020   Procedure: Right Achilles tendon debridement and reconstruction;  Surgeon: Wylene Simmer, MD;  Location: [place];  Service: Orthopedics;  Laterality: Right;   ACHILLES TENDON SURGERY Right 12/13/2020   Procedure: Right Achilles Tendon Reconstruction;  Surgeon: Wylene Simmer, MD;  Location: [place];  Service: Orthopedics;  Laterality: Right;   ELBOW FRACTURE SURGERY     GASTROC RECESSION EXTREMITY Right  12/13/2020   Procedure: Gastroc Recession;  Surgeon: Wylene Simmer, MD;  Location: [place];  Service: Orthopedics;  Laterality: Right;       History reviewed. No pertinent family history.  Social History   Tobacco Use   Smoking status: Every Day    Packs/day: 0.25    Types: Cigarettes   Smokeless tobacco: Never  Vaping Use   Vaping Use: Never used  Substance Use Topics   Alcohol use: Yes    Comment: occasional   Drug use: No    Home Medications Prior to Admission medications   Not on File    Allergies    Vicodin [hydrocodone-acetaminophen]  Review of Systems   Review of Systems  Unable to perform ROS: Acuity of condition  Constitutional:  Negative for fever.  Cardiovascular:  Negative for chest pain.  Gastrointestinal:  Negative for blood in stool and vomiting.  Neurological:  Positive for weakness. Negative for loss of consciousness and headaches.   Physical Exam Updated Vital Signs BP (!) 164/95   Pulse 84   Temp 98 F (36.7 C) (Oral)   Resp 19   SpO2 98%   Physical Exam CONSTITUTIONAL: Well developed/well nourished HEAD: Normocephalic/atraumatic EYES: EOMI/PERRL ENMT: Mucous membranes moist NECK: supple no meningeal signs SPINE/BACK:entire spine nontender CV: S1/S2 noted, no murmurs/rubs/gallops noted LUNGS: Lungs are clear to auscultation bilaterally, no apparent distress ABDOMEN: soft, nontender, no rebound or guarding, bowel sounds noted throughout abdomen GU:no cva tenderness NEURO: Pt is awake/alert/appropriate, Left facial droop  noted.  Dysarthria noted.  Left arm and left leg drift is noted.  No visual deficits.  No sensory deficit is noted. NIHSS equals 5 EXTREMITIES: pulses normal/equalx4, full ROM SKIN: warm, color normal PSYCH: no abnormalities of mood noted, alert and oriented to situation  ED Results / Procedures / Treatments   Labs (all labs ordered are listed, but only abnormal results are displayed) Labs Reviewed   CBC - Abnormal; Notable for the following components:      Result Value   WBC 15.5 (*)    All other components within normal limits  DIFFERENTIAL - Abnormal; Notable for the following components:   Neutro Abs 8.4 (*)    Lymphs Abs 5.6 (*)    All other components within normal limits  COMPREHENSIVE METABOLIC PANEL - Abnormal; Notable for the following components:   Potassium 3.4 (*)    Glucose, Bld 114 (*)    All other components within normal limits  I-STAT CHEM 8, ED - Abnormal; Notable for the following components:   Glucose, Bld 111 (*)    Calcium, Ion 1.14 (*)    All other components within normal limits  CBG MONITORING, ED - Abnormal; Notable for the following components:   Glucose-Capillary 112 (*)    All other components within normal limits  RESP PANEL BY RT-PCR (FLU A&B, COVID) ARPGX2  ETHANOL  PROTIME-INR  APTT  RAPID URINE DRUG SCREEN, HOSP PERFORMED  URINALYSIS, ROUTINE W REFLEX MICROSCOPIC    EKG  ED ECG REPORT   Date: 05/24/2021 0323am  Rate: 97  Rhythm: normal sinus rhythm  QRS Axis: normal  Intervals: normal  ST/T Wave abnormalities: normal  Conduction Disutrbances:none  Narrative Interpretation: PVC noted    I have personally reviewed the EKG tracing and agree with the computerized printout as noted.   Radiology CT ANGIO HEAD NECK W WO CM  Result Date: 05/24/2021 CLINICAL DATA:  Initial evaluation for neuro deficit, stroke, left-sided weakness. EXAM: CT ANGIOGRAPHY HEAD AND NECK TECHNIQUE: Multidetector CT imaging of the head and neck was performed using the standard protocol during bolus administration of intravenous contrast. Multiplanar CT image reconstructions and MIPs were obtained to evaluate the vascular anatomy. Carotid stenosis measurements (when applicable) are obtained utilizing NASCET criteria, using the distal internal carotid diameter as the denominator. CONTRAST:  183m OMNIPAQUE IOHEXOL 350 MG/ML SOLN COMPARISON:  Prior head CT from  earlier the same day. FINDINGS: CTA NECK FINDINGS Aortic arch: Examination severely limited due to poor timing of the contrast bolus, with little contrast within the arterial system. Additionally, evaluation somewhat limited by habitus. Visualized aortic arch normal in caliber with normal branch pattern. Minimal plaque within the arch itself. No stenosis about the origin of the great vessels. Right carotid system: Right common and internal carotid arteries grossly patent to the skull base without visible stenosis or occlusion. No obvious dissection or other acute vascular abnormality. Left carotid system: Left common and internal carotid arteries patent without visible stenosis or occlusion. No obvious dissection or other acute vascular abnormality. Vertebral arteries: Both vertebral arteries arise from the subclavian arteries. No proximal subclavian artery stenosis. Vertebral arteries grossly patent within the neck without visible stenosis or occlusion. No obvious dissection or other abnormality. Osteophytic overgrowth at the left C6 transverse foramen with mild mass effect on the adjacent left V2 segment noted (series 4, image 65). Skeleton: No visible acute osseous finding. No discrete or worrisome osseous lesions. Other neck: No other acute soft tissue abnormality within the neck. No mass or  adenopathy. Upper chest: Visualized upper chest demonstrates no acute finding. Review of the MIP images confirms the above findings CTA HEAD FINDINGS Anterior circulation: Evaluation of the intracranial circulation severely limited with little contrast within the arterial system. ICAs grossly patent to the termini without visible stenosis. Left A1 grossly patent. Suspected hypoplastic and/or absent right A1. Grossly negative anterior communicating artery complex. ACAs grossly perfused bilaterally. M1 segments grossly patent without visible stenosis. Grossly negative MCA bifurcations. No visible proximal MCA branch occlusion,  although evaluation of the MCA branches is fairly limited on this exam. Posterior circulation: Both V4 segments patent to the vertebrobasilar junction without visible stenosis. Both PICA origins patent. 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 Basilar patent to its distal aspect without visible stenosis superior cerebral arteries patent at their origins. Both PCAs appear to be primarily supplied via the basilar. PCAs grossly perfused bilaterally without visible stenosis. Venous sinuses: Grossly patent allowing for timing the contrast bolus. Anatomic variants: Probable hypoplastic/absent right A1 segment. Review of the MIP images confirms the above findings IMPRESSION: 1. Severely limited exam due to poor timing of the contrast bolus. 2. Grossly negative CTA of the head and neck. No visible large vessel occlusion or hemodynamically significant stenosis. No other obvious acute vascular abnormality. Electronically Signed   By: Jeannine Boga M.D.   On: 05/24/2021 04:25   CT HEAD CODE STROKE WO CONTRAST  Result Date: 05/24/2021 CLINICAL DATA:  Code stroke. Initial evaluation for acute left-sided weakness. 6 head cycle concern cough radiology stridor HS o'clock EXAM: CT HEAD WITHOUT CONTRAST TECHNIQUE: Contiguous axial images were obtained from the base of the skull through the vertex without intravenous contrast. COMPARISON:  None. FINDINGS: Brain: Cerebral volume within normal limits for patient age. No evidence for acute intracranial hemorrhage. No findings to suggest acute large vessel territory infarct. No mass lesion, midline shift, or mass effect. Ventricles are normal in size without evidence for hydrocephalus. No extra-axial fluid collection identified. Vascular: No hyperdense vessel identified. Skull: Scalp soft tissues demonstrate no acute abnormality. Calvarium intact. Sinuses/Orbits: Globes and orbital soft tissues within normal limits. Visualized paranasal sinuses are  clear. No mastoid effusion. ASPECTS [facility] Stroke Program Early CT Score) - Ganglionic level infarction (caudate, lentiform nuclei, internal capsule, insula, M1-M3 cortex): 7 - Supraganglionic infarction (M4-M6 cortex): 3 Total score (0-10 with 10 being normal): 10 IMPRESSION: 1. Negative head CT.  No acute intracranial abnormality. 2. ASPECTS is 10. Critical Value/emergent results were called by telephone at the time of interpretation on 05/24/2021 at 3:28 am to provider Ripley Fraise , who verbally acknowledged these results. Electronically Signed   By: Jeannine Boga M.D.   On: 05/24/2021 03:30    Procedures .Critical Care  Date/Time: 05/24/2021 3:40 AM Performed by: Ripley Fraise, MD Authorized by: Ripley Fraise, MD   Critical care provider statement:    Critical care time (minutes):  54   Critical care start time:  05/24/2021 3:40 AM   Critical care end time:  05/24/2021 4:34 AM   Critical care time was exclusive of:  Separately billable procedures and treating other patients   Critical care was necessary to treat or prevent imminent or life-threatening deterioration of the following conditions:  CNS failure or compromise   Critical care was time spent personally by me on the following activities:  Discussions with consultants, development of treatment plan with patient or surrogate, evaluation of patient's response to treatment, examination of patient, re-evaluation of patient's condition, pulse oximetry, ordering and review of radiographic  studies, ordering and review of laboratory studies, ordering and performing treatments and interventions and review of old charts   I assumed direction of critical care for this patient from another provider in my specialty: no     Care discussed with: admitting provider     Medications Ordered in ED Medications  diphenhydrAMINE (BENADRYL) injection 25 mg (has no administration in time range)  iohexol (OMNIPAQUE) 350 MG/ML injection 100 mL  (100 mLs Intravenous Contrast Given 05/24/21 0341)  aspirin chewable tablet 81 mg (81 mg Oral Given 05/24/21 0444)  clopidogrel (PLAVIX) tablet 300 mg (300 mg Oral Given 05/24/21 0444)    ED Course  I have reviewed the triage vital signs and the nursing notes.  Pertinent labs & imaging results that were available during my care of the patient were reviewed by me and considered in my medical decision making (see chart for details).    MDM Rules/Calculators/A&P                           Patient seen on arrival to the room.  He presents with signs of stroke.  Last known well approximately 1 hour prior to arrival Code stroke was initiated and patient went to CT imaging. I personally reviewed CT head and is negative. Telemetry neurologist Dr. Vickki Muff evaluated the patient. I was present for the entire exam Current NIHSS is 5 After extensive discussion with patient and neurologist, and patient's spouse, patient has declined IV tPA He does not want to take the risk of any significant bleeding However he is amenable to a procedure if he has a large vessel occlusion. We will proceed with CT angio head and neck and perfusion study tPA in stroke considered but not given due to: Patient/family refused  4:33 AM CTA did not reveal any obvious LVO, therefore no other intervention is recommended Per neurology recommendations, will give 300 mg Plavix and 81 mg aspirin Patient will be admitted for stroke work-up 5:07 AM Discussed with Dr. Josephine Cables for admission Patient reports he had vomiting immediately upon receiving IV contrast.  Denies any shortness of breath or rash.  However he does feel that his throat is somewhat swollen.  He is able to swallow.  There is no stridor or drooling.  Patient already had dysarthria from the stroke  on exam, his neck is supple without any anterior neck edema His uvula is midline with mild edema.  His tongue is mildly edematous.  He has no signs of any impending airway  compromise. Will defer epinephrine at this time due to hypertension and stroke, but will give Benadryl and monitor  Final Clinical Impression(s) / ED Diagnoses Final diagnoses:  Cerebrovascular accident (CVA), unspecified mechanism [facility])    Rx / [place] Orders ED Discharge Orders     None        Ripley Fraise, MD 05/24/21 [phone]

## 2021-05-24 NOTE — ED Triage Notes (Signed)
Pt arrived via POV, noticed Left sided weakness with left side facial droop. Last known well today 0200.

## 2021-05-24 NOTE — Progress Notes (Signed)
  Echocardiogram 2D Echocardiogram has been performed.  Mark Anthony 05/24/2021, 11:40 AM

## 2021-05-24 NOTE — Consult Note (Addendum)
[facility] Consult Services  *addendum: no Lvo, though suboptimal bolus timing.   Date of Service:   05/24/2021 03:04:07  Diagnosis:       I63.9 - Cerebrovascular accident (CVA), unspecified mechanism ([place])  Impression:      Pt with a h/o HTN, recent diagnosis of diabetes and prior h/o TIA presents with left sided weakness, dysarthria. LKW 0030. He was offered tPA but declines this.       CTAs pending. Clinical appearance more c/w lacunar syndrome.       If no LVO recommend plavix 300 mg + ASA 81, admit for MRI brain, TTE.  Metrics: Last Known Well: 05/24/2021 00:30:00 [facility] Notification Time: 05/24/2021 03:04:07 Arrival Time: 05/24/2021 02:57:00 Stamp Time: 05/24/2021 03:04:07 Initial Response Time: 05/24/2021 03:09:55 Symptoms: left sided . NIHSS Start Assessment Time: 05/24/2021 03:16:13 Patient is not a candidate for Thrombolytic. Thrombolytic Medical Decision: 05/24/2021 03:25:31 Patient was not deemed candidate for Thrombolytic because of following reasons: Patient and/or Family refused.  CT head was reviewed and results were: I personally Reviewed the CT Head and it Showed no acute process  ED Physician notified of diagnostic impression and management plan on 05/24/2021 03:33:27  Advanced Imaging: CTA Head and Neck Recommended:   Our recommendations are outlined below.  Recommendations:        Stroke/Telemetry Floor       Neuro Checks       Bedside Swallow Eval       DVT Prophylaxis       IV Fluids, Normal Saline       Head of Bed 30 Degrees       Euglycemia and Avoid Hyperthermia (PRN Acetaminophen)       Bolus with Clopidogrel 300 mg bolus x1 and initiate dual antiplatelet therapy with Aspirin 81 mg daily and Clopidogrel 75 mg daily       Antihypertensives PRN if Blood pressure is greater than 220/120 or there is a concern for End organ damage/contraindications for permissive HTN. If blood pressure is greater than  220/120 give labetalol PO or IV or Vasotec IV with a goal of 15% reduction in BP during the first 24 hours.  Routine Consultation with [facility] for Follow up Care  Sign Out:       Discussed with Emergency Department Provider    ------------------------------------------------------------------------------  History of Present Illness: Patient is a 45 year old Male.  Patient was brought by private transportation with symptoms of left sided .  The patient got up out of a chair at approximately 0200 and had difficulty walking. He has a h/o prior TIA (2-3 years ago) and recent diagnosis of diabetes. He last walked normally at 1230 and at that time did not notice any problems. He has not had prior similar symptoms.   Past Medical History:      Hypertension      Diabetes Mellitus      There is NO history of Hyperlipidemia      There is NO history of Atrial Fibrillation      There is NO history of Coronary Artery Disease      There is NO history of Stroke  Social History: Smoking: No Alcohol Use: Yes Drug Use: No  Anticoagulant use:  No  Antiplatelet use: No  Allergies:  Reviewed    Examination: BP(183/110), Pulse(110), Blood Glucose(112) 1A: Level of Consciousness - Alert; keenly responsive + 0 1B: Ask Month and Age - Both Questions Right + 0 1C: Blink Eyes & Squeeze Hands -  Performs Both Tasks + 0 2: Test Horizontal Extraocular Movements - Normal + 0 3: Test Visual Fields - No Visual Loss + 0 4: Test Facial Palsy (Use Grimace if Obtunded) - Partial paralysis (lower face) + 2 5A: Test Left Arm Motor Drift - Drift, but doesn't hit bed + 1 5B: Test Right Arm Motor Drift - No Drift for 10 Seconds + 0 6A: Test Left Leg Motor Drift - Drift, but doesn't hit bed + 1 6B: Test Right Leg Motor Drift - No Drift for 5 Seconds + 0 7: Test Limb Ataxia (FNF/Heel-Shin) - No Ataxia + 0 8: Test Sensation - Normal; No sensory loss + 0 9: Test Language/Aphasia - Normal; No aphasia  + 0 10: Test Dysarthria - Mild-Moderate Dysarthria: Slurring but can be understood + 1 11: Test Extinction/Inattention - No abnormality + 0  NIHSS Score: 5   Pre-Morbid Modified Rankin Scale: 0 Points = No symptoms at all   Patient/Family was informed the Neurology Consult would occur via TeleHealth consult by way of interactive audio and video telecommunications and consented to receiving care in this manner.   Patient is being evaluated for possible acute neurologic impairment and high probability of imminent or life-threatening deterioration. I spent total of 29 minutes providing care to this patient, including time for face to face visit via telemedicine, review of medical records, imaging studies and discussion of findings with providers, the patient and/or family.   Dr Jeralene Huff   [facility] [phone]  Case DO:[id number]

## 2021-05-24 NOTE — Evaluation (Signed)
Physical Therapy Evaluation Patient Details Name: Mark Anthony MRN: [id number] DOB: Nov 26, 1975 Today's Date: 05/24/2021   History of Present Illness  Pt arrived via POV, noticed Left sided weakness with left side facial droop. Last known well today 0200   Clinical Impression  Patient functioning at baseline for functional mobility and gait other than limited right ankle dorsiflexion during ambulation possibly due to right achilles tendon weakness not related to possible CVA.  Plan:  Patient discharged from physical therapy to care of nursing for ambulation daily as tolerated for length of stay.      Follow Up Recommendations Outpatient PT;Other (comment) (for right achilles tenden strengthening)    Equipment Recommendations       Recommendations for Other Services       Precautions / Restrictions Precautions Precautions: None Restrictions Weight Bearing Restrictions: No      Mobility  Bed Mobility Overal bed mobility:  (not tested)                  Transfers Overall transfer level: Modified independent                  Ambulation/Gait Ambulation/Gait assistance: Modified independent (Device/Increase time) Gait Distance (Feet): 150 Feet Assistive device: None Gait Pattern/deviations: Decreased stance time - right;Decreased dorsiflexion - right Gait velocity: slightly decreased   General Gait Details: grossly WFL except mildly limited right ankle dorsiflexion due to heelcord weakness not probably not related to CVA  Stairs            Wheelchair Mobility    Modified Rankin (Stroke Patients Only)       Balance Overall balance assessment: Mild deficits observed, not formally tested                                           Pertinent Vitals/Pain Pain Assessment: No/denies pain    Home Living Family/patient expects to be discharged to:: Private residence Living Arrangements: Spouse/significant other Available Help at  Discharge: Family Type of Home: House Home Access: Ramped entrance     Home Layout: One level Home Equipment: Environmental consultant - 2 wheels      Prior Function Level of Independence: Independent         Comments: community ambulator, drives     Hand Dominance   Dominant Hand: Right    Extremity/Trunk Assessment   Upper Extremity Assessment Upper Extremity Assessment: Defer to OT evaluation    Lower Extremity Assessment Lower Extremity Assessment: Overall WFL for tasks assessed    Cervical / Trunk Assessment Cervical / Trunk Assessment: Normal  Communication   Communication: No difficulties  Cognition Arousal/Alertness: Awake/alert Behavior During Therapy: WFL for tasks assessed/performed Overall Cognitive Status: Within Functional Limits for tasks assessed                                        General Comments      Exercises     Assessment/Plan    PT Assessment All further PT needs can be met in the next venue of care  PT Problem List Decreased mobility;Decreased activity tolerance;Decreased strength       PT Treatment Interventions      PT Goals (Current goals can be found in the Care Plan section)  Acute Rehab PT Goals Patient Stated Goal: To  go home PT Goal Formulation: With patient/family Time For Goal Achievement: 05/24/21    Frequency     Barriers to discharge        Co-evaluation               AM-PAC PT "6 Clicks" Mobility  Outcome Measure Help needed turning from your back to your side while in a flat bed without using bedrails?: None Help needed moving from lying on your back to sitting on the side of a flat bed without using bedrails?: None Help needed moving to and from a bed to a chair (including a wheelchair)?: None Help needed standing up from a chair using your arms (e.g., wheelchair or bedside chair)?: None Help needed to walk in hospital room?: None Help needed climbing 3-5 steps with a railing? : None 6 Click  Score: 24    End of Session   Activity Tolerance: Patient tolerated treatment well Patient left: in bed;with call bell/phone within reach;with family/visitor present Nurse Communication: Mobility status PT Visit Diagnosis: Unsteadiness on feet (R26.81);Other abnormalities of gait and mobility (R26.89);Muscle weakness (generalized) (M62.81)    Time: 1655-3748 PT Time Calculation (min) (ACUTE ONLY): 13 min   Charges:   PT Evaluation $PT Eval Low Complexity: 1 Low PT Treatments $Therapeutic Activity: 8-22 mins        12:38 PM, 05/24/21 Lonell Grandchild, MPT Physical Therapist with [facility] 336 [phone] office [phone] mobile phone

## 2021-05-24 NOTE — ED Notes (Signed)
Dr. Denton Brick at bedside. Discussing possible DC today rather than admission d/t MRI finding of TIA and improvement in deficits. Requesting pt be moved up on list for ECHO. Brooke in echo notified of plan to attempt to get echo in ED and then discharge.

## 2021-05-24 NOTE — Discharge Instructions (Signed)
1)STop Meloxicam ----Avoid ibuprofen/Advil/Aleve/Motrin/Goody Powders/Naproxen/BC powders/Meloxicam/Diclofenac/Indomethacin and other Nonsteroidal anti-inflammatory medications as these will make you more likely to bleed and can cause stomach ulcers, can also cause Kidney problems.   2)Strongly advised to Quit smoking  to reduce your risk of stroke--- you may use over-the-counter nicotine patch or Wellbutrin as discussed to help you quit smoking  3)Please take  Aspirin  daily with food and also take atorvastatin/Lipitor to reduce your risk for stroke and heart attacks  4) increase activity level/regular exercise and weight loss will be beneficial as well  5) please follow-up with your regular primary care physician within a week for recheck and reevaluation

## 2021-05-25 DIAGNOSIS — IMO0002 Reserved for concepts with insufficient information to code with codable children: Secondary | ICD-10-CM | POA: Diagnosis present

## 2021-05-25 DIAGNOSIS — E1165 Type 2 diabetes mellitus with hyperglycemia: Secondary | ICD-10-CM | POA: Diagnosis present

## 2021-05-25 DIAGNOSIS — E119 Type 2 diabetes mellitus without complications: Secondary | ICD-10-CM | POA: Diagnosis present

## 2021-05-25 LAB — HEMOGLOBIN A1C
Hgb A1c MFr Bld: 7 % — ABNORMAL HIGH (ref 4.8–5.6)
Mean Plasma Glucose: 154 mg/dL

## 2021-05-28 ENCOUNTER — Ambulatory Visit (HOSPITAL_COMMUNITY): Payer: No Typology Code available for payment source | Attending: Orthopedic Surgery | Admitting: Physical Therapy

## 2021-05-28 ENCOUNTER — Other Ambulatory Visit: Payer: Self-pay

## 2021-05-28 DIAGNOSIS — R2689 Other abnormalities of gait and mobility: Secondary | ICD-10-CM | POA: Insufficient documentation

## 2021-05-28 DIAGNOSIS — M25571 Pain in right ankle and joints of right foot: Secondary | ICD-10-CM | POA: Insufficient documentation

## 2021-05-28 DIAGNOSIS — M25671 Stiffness of right ankle, not elsewhere classified: Secondary | ICD-10-CM | POA: Diagnosis present

## 2021-05-28 DIAGNOSIS — R29898 Other symptoms and signs involving the musculoskeletal system: Secondary | ICD-10-CM | POA: Insufficient documentation

## 2021-05-28 DIAGNOSIS — M6281 Muscle weakness (generalized): Secondary | ICD-10-CM | POA: Diagnosis present

## 2021-05-28 NOTE — Therapy (Signed)
[facility] [address] [place], [place], [address] Phone: [phone]   Fax:  [phone]  Physical Therapy Treatment  Patient Details  Name: Mark Anthony MRN: [id number] Date of Birth: 1976/03/11 Referring Provider (PT): Mechele Claude PA-C   Encounter Date: 05/28/2021   PT End of Session - 05/28/21 1642     Visit Number 8    Number of Visits 12    Date for PT Re-Evaluation 06/21/21    Authorization Type [facility] (15 visits approved)    Authorization - Visit Number 8    Authorization - Number of Visits 15    PT Start Time [id number]    PT Stop Time 1632    PT Time Calculation (min) 44 min    Activity Tolerance Patient tolerated treatment well    Behavior During Therapy [facility] for tasks assessed/performed             Past Medical History:  Diagnosis Date   Hypertension    no meds at this time.   Reflux    TIA (transient ischemic attack)    no meds following    Past Surgical History:  Procedure Laterality Date   ACHILLES TENDON SURGERY Right 02/02/2020   Procedure: Right Achilles tendon debridement and reconstruction;  Surgeon: Wylene Simmer, MD;  Location: [facility];  Service: Orthopedics;  Laterality: Right;   ACHILLES TENDON SURGERY Right 12/13/2020   Procedure: Right Achilles Tendon Reconstruction;  Surgeon: Wylene Simmer, MD;  Location: [facility];  Service: Orthopedics;  Laterality: Right;   ELBOW FRACTURE SURGERY     GASTROC RECESSION EXTREMITY Right 12/13/2020   Procedure: Gastroc Recession;  Surgeon: Wylene Simmer, MD;  Location: [facility];  Service: Orthopedics;  Laterality: Right;    There were no vitals filed for this visit.   Subjective Assessment - 05/28/21 1555     Subjective pt states he woke up at 1:30am Friday morning and couldnt move the Lt side of his body.  States he went to ED and had a TIA.  Pt states he can no longer take the meloxicam for ankle.  Reports he has no pain or  issues today in his ankle.    Currently in Pain? No/denies                               [facility] Adult PT Treatment/Exercise - 05/28/21 0001       Ambulation/Gait   Stairs Yes    Stairs Assistance 6: Modified independent (Device/Increase time)    Stair Management Technique Two rails;Alternating pattern    Number of Stairs 4    Height of Stairs 7    Gait Comments 5RT working on push off and control lowering 7" stairs with Bil UE handrails      Ankle Exercises: Stretches   Gastroc Stretch 3 reps;30 seconds    Gastroc Stretch Limitations from step      Ankle Exercises: Aerobic   Recumbent Bike 5 min dynamic warmup Lv 2      Ankle Exercises: Standing   Vector Stance Right;5 seconds;Limitations   10 repetitions with 1 HHA   Rocker Board 3 minutes    Rebounder *    Heel Raises 20 reps    Other Standing Ankle Exercises machine walkout 70# x10,    Other Standing Ankle Exercises tandem stance on 1/2 foam 3 x 30"  PT Short Term Goals - 05/22/21 1614       PT SHORT TERM GOAL #1   Title Patient will be independent with HEP in order to improve functional outcomes.    Time 3    Period Weeks    Status Achieved    Target Date 04/30/21      PT SHORT TERM GOAL #2   Title Patient will report at least 25% improvement in symptoms for improved quality of life.    Time 3    Period Weeks    Status Achieved    Target Date 04/30/21               PT Delellis Term Goals - 05/22/21 1615       PT Lozinski TERM GOAL #1   Title Patient will report at least 75% improvement in symptoms for improved quality of life.    Time 6    Period Weeks    Status On-going      PT Sida TERM GOAL #2   Title Patient will improve FOTO score by at least 15 points in order to indicate improved tolerance to activity.    Time 6    Period Weeks    Status On-going      PT [place] TERM GOAL #3   Title Patient will be able to navigate stairs with reciprocal  pattern without compensation in order to demonstrate improved LE strength.    Baseline can navigate with reciprocal pattern but with decreased eccentric control and R ankle DF    Time 5    Period Weeks    Status On-going      PT Jackowski TERM GOAL #4   Title Patient will be able to ambulate for at least 30 minutes with pain no greater than 1/10 in order to demonstrate improved ability to ambulate in the community.    Time 6    Period Weeks    Status On-going      PT Enright TERM GOAL #5   Title Patient will be able to ambulate at least 425 feet in 2MWT in order to demonstrate improved gait speed for community ambulation.    Time 6    Period Weeks    Status On-going                   Plan - 05/28/21 1644     Clinical Impression Statement Began session on bike and progressed to gastroc stretch using slant board.  Appeared to have increased ankle ROM while completing activity.  Decreased to no UE assist with heelraise as pt is still unable to complete with just 1 UE assist.  Added vector stance on LT to challenge stability of LE and ankle.   Worked on stair negotiation with push off coming up rather than flat footed on Rt and controlled descent as well.  Pt with noted fatigue at end of session today from activities.    Personal Factors and Comorbidities Behavior Pattern;Social Background;Comorbidity 3+;Fitness;Past/Current Experience;Time since onset of injury/illness/exacerbation    Comorbidities BMI over 30, High Blood Pressure, Prior Surgery, hx Stroke or TIA    Examination-Activity Limitations Squat;Stairs;Stand;Locomotion Level;Lift;Bend;Transfers    Examination-Participation Restrictions Cleaning;Community Activity;Occupation;Shop;Volunteer;Yard Work    Stability/Clinical Decision Making Stable/Uncomplicated    Rehab Potential Fair    PT Frequency 2x / week    PT Duration 6 weeks    PT Treatment/Interventions ADLs/Self Care Home Management;Cryotherapy;Aquatic Therapy;Electrical  Stimulation;Iontophoresis '4mg'$ /ml Dexamethasone;Moist Heat;Traction;Ultrasound;Parrafin;DME Instruction;Gait training;Stair training;Functional mobility training;Therapeutic activities;Therapeutic exercise;Balance training;Neuromuscular  re-education;Patient/family education;Orthotic Fit/Training;Manual techniques;Scar mobilization;Compression bandaging;Passive range of motion;Dry needling;Energy conservation;Splinting;Taping;Spinal Manipulations;Joint Manipulations    PT Next Visit Plan Progress functional strengthening with stairs, STS; PF strength, R ankle ROM. Sidestepping. Gait on balance beam    PT Home Exercise Plan band 4 way, HR 7/6 towel scrunch, calf stretch; 7/12: heel raises 30-50 a day    Consulted and Agree with Plan of Care Patient             Patient will benefit from skilled therapeutic intervention in order to improve the following deficits and impairments:  Abnormal gait, Decreased endurance, Hypomobility, Decreased activity tolerance, Decreased strength, Pain, Decreased balance, Decreased mobility, Difficulty walking, Decreased range of motion, Impaired perceived functional ability, Impaired flexibility  Visit Diagnosis: Other abnormalities of gait and mobility  Other symptoms and signs involving the musculoskeletal system  Pain in right ankle and joints of right foot  Muscle weakness (generalized)  Stiffness of right ankle, not elsewhere classified     Problem List Patient Active Problem List   Diagnosis Date Noted   Diabetes mellitus type 2, uncontrolled ([place]) 05/25/2021   TIA (transient ischemic attack)/Mini-Stroke 05/24/2021   Obesity 05/24/2021   Tobacco abuse 05/24/2021   Acute left-sided weakness-- TIA 05/24/2021   HTN (hypertension) 05/24/2021   Teena Irani, PTA/CLT [phone]  Teena Irani 05/28/2021, 4:47 PM  [facility] [address] [place], [place], [address] Phone: [phone]   Fax:   [phone]  Name: Hussain Natale MRN: [id number] Date of Birth: Feb 26, 1976

## 2021-05-30 ENCOUNTER — Other Ambulatory Visit: Payer: Self-pay

## 2021-05-30 ENCOUNTER — Ambulatory Visit (HOSPITAL_COMMUNITY): Payer: No Typology Code available for payment source | Admitting: Physical Therapy

## 2021-05-30 DIAGNOSIS — M25671 Stiffness of right ankle, not elsewhere classified: Secondary | ICD-10-CM

## 2021-05-30 DIAGNOSIS — M25571 Pain in right ankle and joints of right foot: Secondary | ICD-10-CM

## 2021-05-30 DIAGNOSIS — R2689 Other abnormalities of gait and mobility: Secondary | ICD-10-CM

## 2021-05-30 DIAGNOSIS — M6281 Muscle weakness (generalized): Secondary | ICD-10-CM

## 2021-05-30 DIAGNOSIS — R29898 Other symptoms and signs involving the musculoskeletal system: Secondary | ICD-10-CM

## 2021-05-30 NOTE — Therapy (Signed)
[facility] [place], [place], [address] Phone: [phone]   Fax:  [phone]  Physical Therapy Treatment  Patient Details  Name: Natha Hilbert MRN: [id number] Date of Birth: 1976-04-27 Referring Provider (PT): Mechele Claude PA-C   Encounter Date: 05/30/2021   PT End of Session - 05/30/21 1554     Visit Number 9    Number of Visits 12 may go to 15    Date for PT Re-Evaluation 06/21/21    Authorization Type [facility] (15 visits approved)    Authorization - Visit Number 9    Authorization - Number of Visits 15    PT Start Time [id number]    PT Stop Time 1615    PT Time Calculation (min) 40 min    Activity Tolerance Patient tolerated treatment well    Behavior During Therapy [facility] for tasks assessed/performed             Past Medical History:  Diagnosis Date   Hypertension    no meds at this time.   Reflux    TIA (transient ischemic attack)    no meds following    Past Surgical History:  Procedure Laterality Date   ACHILLES TENDON SURGERY Right 02/02/2020   Procedure: Right Achilles tendon debridement and reconstruction;  Surgeon: Wylene Simmer, MD;  Location: [facility];  Service: Orthopedics;  Laterality: Right;   ACHILLES TENDON SURGERY Right 12/13/2020   Procedure: Right Achilles Tendon Reconstruction;  Surgeon: Wylene Simmer, MD;  Location: [facility];  Service: Orthopedics;  Laterality: Right;   ELBOW FRACTURE SURGERY     GASTROC RECESSION EXTREMITY Right 12/13/2020   Procedure: Gastroc Recession;  Surgeon: Wylene Simmer, MD;  Location: [facility];  Service: Orthopedics;  Laterality: Right;    There were no vitals filed for this visit.   Subjective Assessment - 05/30/21 1535     Subjective PT states that the bottom of his foot continues to bother him after standing for 20 minutes.  He also is having difficulty going down steps.    Currently in Pain? No/denies                                [facility] Adult PT Treatment/Exercise - 05/30/21 0001       Ambulation/Gait   Number of Stairs 7   x 3 then 7" step x 3   Height of Stairs 4      Exercises   Exercises Ankle      Ankle Exercises: Stretches   Plantar Fascia Stretch 3 reps;30 seconds    Plantar Fascia Stretch Limitations heel drop off step    Gastroc Stretch 3 reps;20 seconds      Ankle Exercises: Machines for Strengthening   Cybex Leg Press Power tower  on angle 10 degrees RT only plantar flexion x 10x 1; 6 degrees  x 2 reps      Ankle Exercises: Standing   SLS LT 20" x 3;  Rt    Heel Raises 10 reps   Lt 5 Rt unable     Ankle Exercises: Seated   Heel Raises Limitations 15                      PT Short Term Goals - 05/22/21 1614       PT SHORT TERM GOAL #1   Title Patient will be independent with HEP  in order to improve functional outcomes.    Time 3    Period Weeks    Status Achieved    Target Date 04/30/21      PT SHORT TERM GOAL #2   Title Patient will report at least 25% improvement in symptoms for improved quality of life.    Time 3    Period Weeks    Status Achieved    Target Date 04/30/21               PT Marker Term Goals - 05/22/21 1615       PT T[place] TERM GOAL #1   Title Patient will report at least 75% improvement in symptoms for improved quality of life.    Time 6    Period Weeks    Status On-going      PT [place] TERM GOAL #2   Title Patient will improve FOTO score by at least 15 points in order to indicate improved tolerance to activity.    Time 6    Period Weeks    Status On-going      PT Mcneff TERM GOAL #3   Title Patient will be able to navigate stairs with reciprocal pattern without compensation in order to demonstrate improved LE strength.    Baseline can navigate with reciprocal pattern but with decreased eccentric control and R ankle DF    Time 5    Period Weeks    Status On-going      PT [place] TERM GOAL #4   Title  Patient will be able to ambulate for at least 30 minutes with pain no greater than 1/10 in order to demonstrate improved ability to ambulate in the community.    Time 6    Period Weeks    Status On-going      PT [place] TERM GOAL #5   Title Patient will be able to ambulate at least 425 feet in 2MWT in order to demonstrate improved gait speed for community ambulation.    Time 6    Period Weeks    Status On-going                   Plan - 05/30/21 1555     Clinical Impression Statement Pt unable to complete a single leg raise on his Rt LE,  unable to raise on both and lower on Rt therefore pt placed on Powertower,  Pt only able to show motion when tower was at 10 degree angle but noted fatigue after 10 reps therefore lowered to 6 degrees and completed two more reps of 10    Personal Factors and Comorbidities Behavior Pattern;Social Background;Comorbidity 3+;Fitness;Past/Current Experience;Time since onset of injury/illness/exacerbation    Comorbidities BMI over 30, High Blood Pressure, Prior Surgery, hx Stroke or TIA    Examination-Activity Limitations Squat;Stairs;Stand;Locomotion Level;Lift;Bend;Transfers    Examination-Participation Restrictions Cleaning;Community Activity;Occupation;Shop;Volunteer;Yard Work    Stability/Clinical Decision Making Stable/Uncomplicated    Rehab Potential Fair    PT Frequency 2x / week    PT Duration 6 weeks    PT Treatment/Interventions ADLs/Self Care Home Management;Cryotherapy;Aquatic Therapy;Electrical Stimulation;Iontophoresis '4mg'$ /ml Dexamethasone;Moist Heat;Traction;Ultrasound;Parrafin;DME Instruction;Gait training;Stair training;Functional mobility training;Therapeutic activities;Therapeutic exercise;Balance training;Neuromuscular re-education;Patient/family education;Orthotic Fit/Training;Manual techniques;Scar mobilization;Compression bandaging;Passive range of motion;Dry needling;Energy conservation;Splinting;Taping;Spinal Manipulations;Joint  Manipulations    PT Next Visit Plan Make sure pt is completing sitting heelraises at home.  Progress functional strengthening with stairs, STS; PF strength, R ankle ROM. Sidestepping. Gait on balance beam    PT Home Exercise Plan band 4 way, HR  7/6 towel scrunch, calf stretch; 7/12: heel raises 30-50 a day    Consulted and Agree with Plan of Care Patient             Patient will benefit from skilled therapeutic intervention in order to improve the following deficits and impairments:  Abnormal gait, Decreased endurance, Hypomobility, Decreased activity tolerance, Decreased strength, Pain, Decreased balance, Decreased mobility, Difficulty walking, Decreased range of motion, Impaired perceived functional ability, Impaired flexibility  Visit Diagnosis: Other abnormalities of gait and mobility  Other symptoms and signs involving the musculoskeletal system  Pain in right ankle and joints of right foot  Muscle weakness (generalized)  Stiffness of right ankle, not elsewhere classified     Problem List Patient Active Problem List   Diagnosis Date Noted   Diabetes mellitus type 2, uncontrolled ([place]) 05/25/2021   TIA (transient ischemic attack)/Mini-Stroke 05/24/2021   Obesity 05/24/2021   Tobacco abuse 05/24/2021   Acute left-sided weakness-- TIA 05/24/2021   HTN (hypertension) 05/24/2021   Rayetta Humphrey, PT CLT [phone]  05/30/2021, 4:14 PM  [facility] [address] [place], [place], [address] Phone: [phone]   Fax:  [phone]  Name: Mark Anthony MRN: [id number] Date of Birth: 1976-09-09

## 2021-06-06 ENCOUNTER — Encounter (HOSPITAL_COMMUNITY): Payer: Self-pay | Admitting: Physical Therapy

## 2021-06-06 ENCOUNTER — Other Ambulatory Visit: Payer: Self-pay

## 2021-06-06 ENCOUNTER — Ambulatory Visit (HOSPITAL_COMMUNITY): Payer: No Typology Code available for payment source | Admitting: Physical Therapy

## 2021-06-06 DIAGNOSIS — M25571 Pain in right ankle and joints of right foot: Secondary | ICD-10-CM

## 2021-06-06 DIAGNOSIS — M25671 Stiffness of right ankle, not elsewhere classified: Secondary | ICD-10-CM

## 2021-06-06 DIAGNOSIS — R2689 Other abnormalities of gait and mobility: Secondary | ICD-10-CM | POA: Diagnosis not present

## 2021-06-06 DIAGNOSIS — R29898 Other symptoms and signs involving the musculoskeletal system: Secondary | ICD-10-CM

## 2021-06-06 DIAGNOSIS — M6281 Muscle weakness (generalized): Secondary | ICD-10-CM

## 2021-06-06 NOTE — Therapy (Signed)
[facility] [address] [place], [place], [address] Phone: [phone]   Fax:  [phone]  Physical Therapy Treatment  Patient Details  Name: Mark Anthony MRN: [id number] Date of Birth: Mar 02, 1976 Referring Provider (PT): Mechele Claude PA-C  Progress Note Reporting Period 05/22/21 to 06/06/21  See note below for Objective Data and Assessment of Progress/Goals.      Encounter Date: 06/06/2021   PT End of Session - 06/06/21 1447     Visit Number 10    Number of Visits 15    Date for PT Re-Evaluation 06/28/21    Authorization Type [facility] (15 visits approved)    Authorization - Visit Number 10    Authorization - Number of Visits 15    PT Start Time [id number]    PT Stop Time 1525    PT Time Calculation (min) 47 min    Activity Tolerance Patient limited by pain    Behavior During Therapy WFL for tasks assessed/performed             Past Medical History:  Diagnosis Date   Hypertension    no meds at this time.   Reflux    TIA (transient ischemic attack)    no meds following    Past Surgical History:  Procedure Laterality Date   ACHILLES TENDON SURGERY Right 02/02/2020   Procedure: Right Achilles tendon debridement and reconstruction;  Surgeon: Wylene Simmer, MD;  Location: [facility];  Service: Orthopedics;  Laterality: Right;   ACHILLES TENDON SURGERY Right 12/13/2020   Procedure: Right Achilles Tendon Reconstruction;  Surgeon: Wylene Simmer, MD;  Location: [facility];  Service: Orthopedics;  Laterality: Right;   ELBOW FRACTURE SURGERY     GASTROC RECESSION EXTREMITY Right 12/13/2020   Procedure: Gastroc Recession;  Surgeon: Wylene Simmer, MD;  Location: [facility];  Service: Orthopedics;  Laterality: Right;    There were no vitals filed for this visit.   Subjective Assessment - 06/06/21 1446     Subjective Patient says he is not doing good after last visit. He says doing the total gym was  very difficult and he was very swollen and sore afterward.    Currently in Pain? Yes    Pain Score 3     Pain Location Ankle    Pain Orientation Right    Pain Descriptors / Indicators Aching    Pain Type Chronic pain                [facility] PT Assessment - 06/06/21 0001       Assessment   Medical Diagnosis Rupture of R achilles tendon/repair    Referring Provider (PT) Mechele Claude PA-C    Prior Therapy yes      Restrictions   Weight Bearing Restrictions No      Balance Screen   Has the patient fallen in the past 6 months No      [place] residence      Prior Function   Level of Independence Independent      Cognition   Overall Cognitive Status Within Functional Limits for tasks assessed      Observation/Other Assessments   Focus on Therapeutic Outcomes (FOTO)  40% function   was 47% function last reported     AROM   Right Ankle Dorsiflexion 8    Right Ankle Plantar Flexion 30      Strength   Right Ankle Dorsiflexion 5/5  Right Ankle Plantar Flexion 3+/5    Right Ankle Inversion 5/5    Right Ankle Eversion 5/5      Palpation   Palpation comment Mod TTP about medial arch of RT foot, and sensitivty about dorsum of RT foot      Ambulation/Gait   Ambulation/Gait Yes    Ambulation Distance (Feet) 475 Feet    Assistive device None    Gait Pattern Antalgic;Decreased stance time - right;Decreased step length - left;Decreased stride length    Ambulation Surface Level;Indoor    Stair Management Technique Two rails;Alternating pattern   Heavy use of UEs   Number of Stairs 4    Height of Stairs 7    Gait Comments 2MWT                                     PT Short Term Goals - 05/22/21 1614       PT SHORT TERM GOAL #1   Title Patient will be independent with HEP in order to improve functional outcomes.    Time 3    Period Weeks    Status Achieved    Target Date 04/30/21      PT SHORT TERM GOAL  #2   Title Patient will report at least 25% improvement in symptoms for improved quality of life.    Time 3    Period Weeks    Status Achieved    Target Date 04/30/21               PT [place] Term Goals - 06/06/21 1607       PT G[place]an TERM GOAL #1   Title Patient will report at least 75% improvement in symptoms for improved quality of life.    Baseline Reports 25%    Time 6    Period Weeks    Status On-going      PT [place]orte TERM GOAL #2   Title Patient will improve FOTO score by at least 15 points in order to indicate improved tolerance to activity.    Baseline Decreased 7%    Time 6    Period Weeks    Status On-going      PT Rantz TERM GOAL #3   Title Patient will be able to navigate stairs with reciprocal pattern without compensation in order to demonstrate improved LE strength.    Baseline can navigate with reciprocal pattern but with heavy compensation and reliance on UEs    Time 5    Period Weeks    Status On-going      PT Bumbaugh TERM GOAL #4   Title Patient will be able to ambulate for at least 30 minutes with pain no greater than 1/10 in order to demonstrate improved ability to ambulate in the community.    Baseline Reports 15-20 minutes max with pain    Time 6    Period Weeks    Status On-going      PT Patalano TERM GOAL #5   Title Patient will be able to ambulate at least 425 feet in 2MWT in order to demonstrate improved gait speed for community ambulation.    Baseline Current 475 with no AD    Time 6    Period Weeks    Status Achieved                   Plan - 06/06/21 1609  Clinical Impression Statement Patient shows some objective improvements despite decreased subjective reports. Patient noting increased pain and decreased functional mobility over the past week. He is concerned he may have reinjured something. Objective findings today suggest this is unlikely, but his subjective report is inconsistent with findings. He states "I feel like my foot wants  to explode". Observed edema does not appear unusual. Suggested patient follow up with ortho MD in reference to his concerns of reinjury, otherwise will continue with therapy to address remaining deficits for improved functional mobility and decreased pain.    Personal Factors and Comorbidities Behavior Pattern;Social Background;Comorbidity 3+;Fitness;Past/Current Experience;Time since onset of injury/illness/exacerbation    Comorbidities BMI over 30, High Blood Pressure, Prior Surgery, hx Stroke or TIA    Examination-Activity Limitations Squat;Stairs;Stand;Locomotion Level;Lift;Bend;Transfers    Examination-Participation Restrictions Cleaning;Community Activity;Occupation;Shop;Volunteer;Yard Work    Stability/Clinical Decision Making Stable/Uncomplicated    Rehab Potential Fair    PT Frequency 2x / week    PT Duration 3 weeks    PT Treatment/Interventions ADLs/Self Care Home Management;Cryotherapy;Aquatic Therapy;Electrical Stimulation;Iontophoresis '4mg'$ /ml Dexamethasone;Moist Heat;Traction;Ultrasound;Parrafin;DME Instruction;Gait training;Stair training;Functional mobility training;Therapeutic activities;Therapeutic exercise;Balance training;Neuromuscular re-education;Patient/family education;Orthotic Fit/Training;Manual techniques;Scar mobilization;Compression bandaging;Passive range of motion;Dry needling;Energy conservation;Splinting;Taping;Spinal Manipulations;Joint Manipulations    PT Next Visit Plan F/U on MD consult. Resume activity as tolerated. Progress functional strengthening with stairs, STS; PF strength, R ankle ROM. Sidestepping. Gait on balance beam when ready    PT Home Exercise Plan band 4 way, HR 7/6 towel scrunch, calf stretch; 7/12: heel raises 30-50 a day    Consulted and Agree with Plan of Care Patient             Patient will benefit from skilled therapeutic intervention in order to improve the following deficits and impairments:  Abnormal gait, Decreased endurance,  Hypomobility, Decreased activity tolerance, Decreased strength, Pain, Decreased balance, Decreased mobility, Difficulty walking, Decreased range of motion, Impaired perceived functional ability, Impaired flexibility  Visit Diagnosis: Other abnormalities of gait and mobility - Plan: PT plan of care cert/re-cert  Other symptoms and signs involving the musculoskeletal system - Plan: PT plan of care cert/re-cert  Pain in right ankle and joints of right foot - Plan: PT plan of care cert/re-cert  Muscle weakness (generalized) - Plan: PT plan of care cert/re-cert  Stiffness of right ankle, not elsewhere classified - Plan: PT plan of care cert/re-cert     Problem List Patient Active Problem List   Diagnosis Date Noted   Diabetes mellitus type 2, uncontrolled ([place]) 05/25/2021   TIA (transient ischemic attack)/Mini-Stroke 05/24/2021   Obesity 05/24/2021   Tobacco abuse 05/24/2021   Acute left-sided weakness-- TIA 05/24/2021   HTN (hypertension) 05/24/2021   4:16 PM, 06/06/21 Josue Hector PT DPT  Physical Therapist with [facility]  [phone] [place], [place], [address] Phone: [phone]   Fax:  [phone]  Name: Mark Anthony MRN: [id number] Date of Birth: 12/22/1975

## 2021-06-11 ENCOUNTER — Ambulatory Visit (HOSPITAL_COMMUNITY): Payer: No Typology Code available for payment source

## 2021-06-13 ENCOUNTER — Telehealth (HOSPITAL_COMMUNITY): Payer: Self-pay

## 2021-06-13 ENCOUNTER — Encounter (HOSPITAL_COMMUNITY): Payer: Non-veteran care

## 2021-06-13 NOTE — Telephone Encounter (Signed)
Pt called to say he has a covid test pending and he will not come in until he knows for sure that he is not positive.

## 2021-06-18 ENCOUNTER — Ambulatory Visit (HOSPITAL_COMMUNITY): Payer: No Typology Code available for payment source

## 2021-06-20 ENCOUNTER — Encounter (HOSPITAL_COMMUNITY): Payer: Non-veteran care | Admitting: Physical Therapy

## 2021-06-25 ENCOUNTER — Encounter (HOSPITAL_COMMUNITY): Payer: Non-veteran care | Admitting: Physical Therapy

## 2021-06-27 ENCOUNTER — Ambulatory Visit (HOSPITAL_COMMUNITY): Payer: No Typology Code available for payment source | Admitting: Physical Therapy

## 2021-07-02 ENCOUNTER — Encounter (HOSPITAL_COMMUNITY): Payer: Non-veteran care | Admitting: Physical Therapy

## 2021-07-04 ENCOUNTER — Encounter (HOSPITAL_COMMUNITY): Payer: Non-veteran care | Admitting: Physical Therapy

## 2021-07-09 ENCOUNTER — Encounter (HOSPITAL_COMMUNITY): Payer: Non-veteran care | Admitting: Physical Therapy

## 2021-11-06 ENCOUNTER — Encounter (HOSPITAL_COMMUNITY): Payer: Self-pay | Admitting: Physical Therapy

## 2021-11-06 NOTE — Therapy (Signed)
[facility] [place], [place], [address] Phone: [phone]   Fax:  [phone]  Patient Details  Name: Mark Anthony MRN: [id number] Date of Birth: [birth year] Referring Provider:  No ref. provider found  Encounter Date: 11/06/2021  PHYSICAL THERAPY DISCHARGE SUMMARY  Visits from Start of Care: 10  Current functional level related to goals / functional outcomes: NA   Remaining deficits: NA   Education / Equipment: Patient DC per non return    Patient agrees to discharge. Patient goals were partially met. Patient is being discharged due to not returning since the last visit.  11:46 AM, 11/06/21 Josue Hector PT DPT  Physical Therapist with [facility]  [phone] [place] [address] [place], [place], [address] Phone: [phone]   Fax:  [phone]

## 2021-11-30 IMAGING — US US CAROTID DUPLEX BILAT
1 series · 13 of 24 positions shown · non-contrast
Comparison: None.

CLINICAL DATA: 45-year-old male with syncope

EXAM:
BILATERAL CAROTID DUPLEX ULTRASOUND
TECHNIQUE: Gray scale imaging, color Doppler and duplex ultrasound were
performed of bilateral carotid and vertebral arteries in the neck.

[Series 1: us carotid bilateral · 13 of 73 slices shown]
[im 1/73]
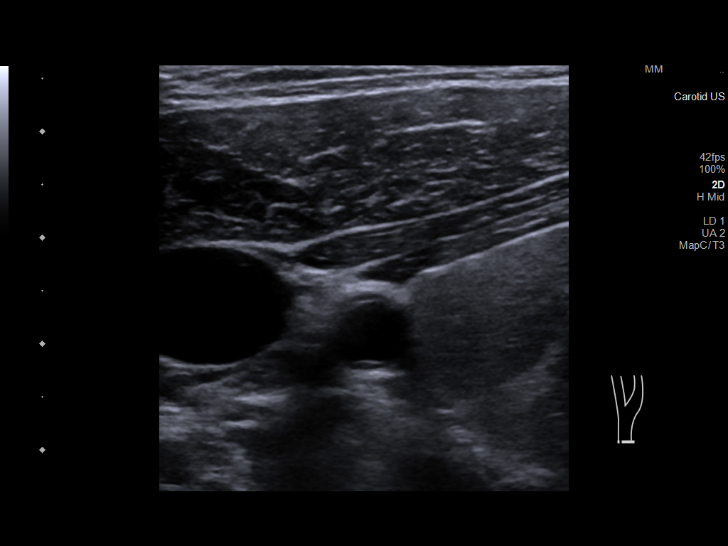
[im 7/73]
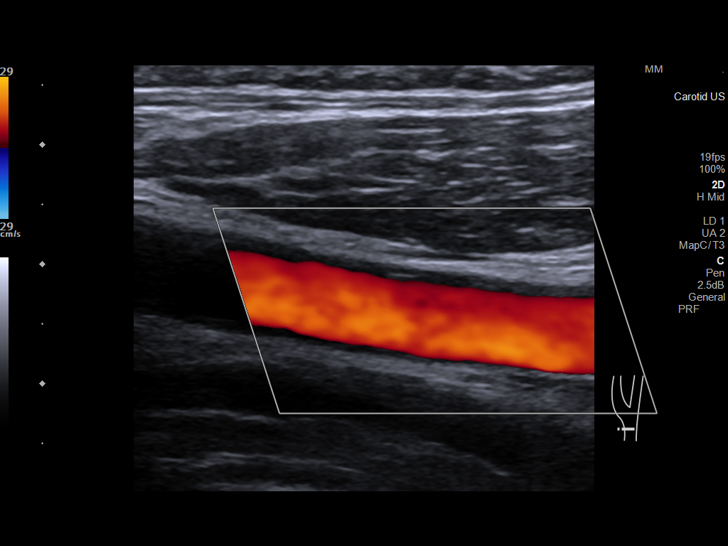
[im 13/73]
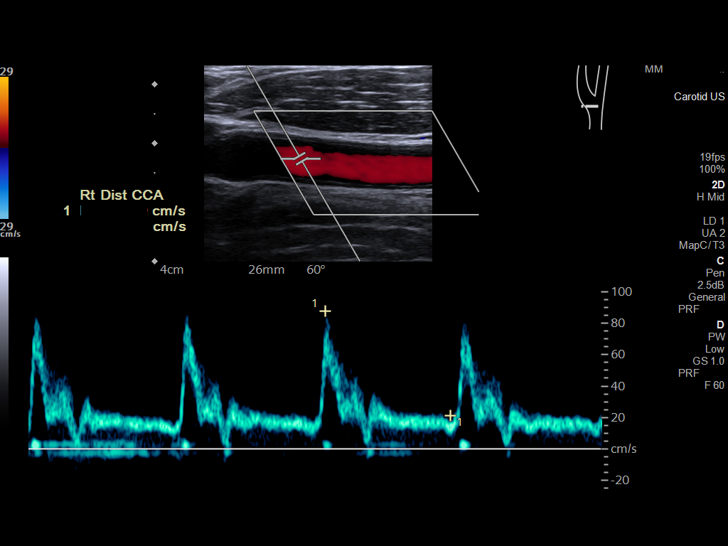
[im 19/73]
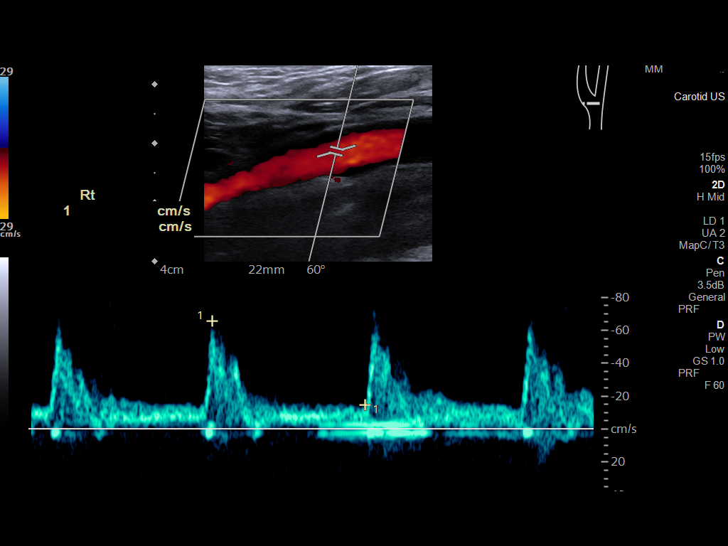
[im 26/73]
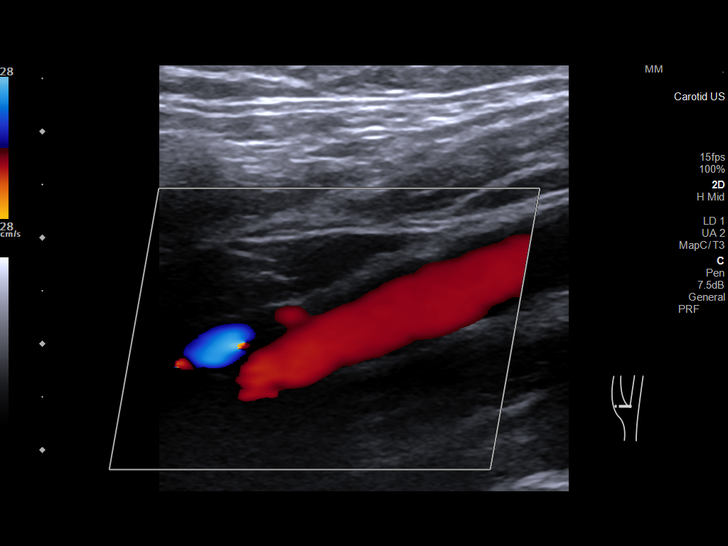
[im 32/73]
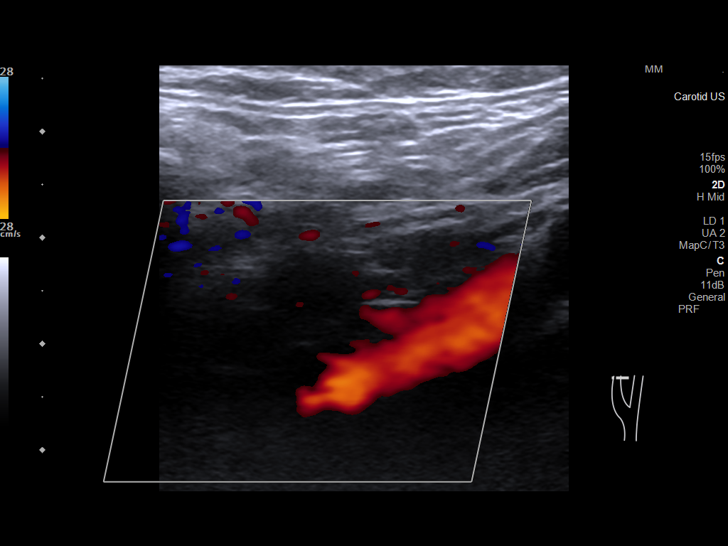
[im 38/73]
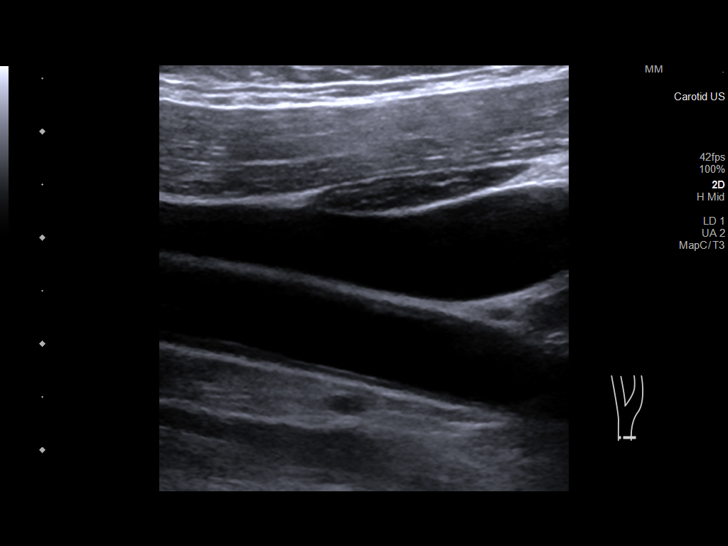
[im 41/73]
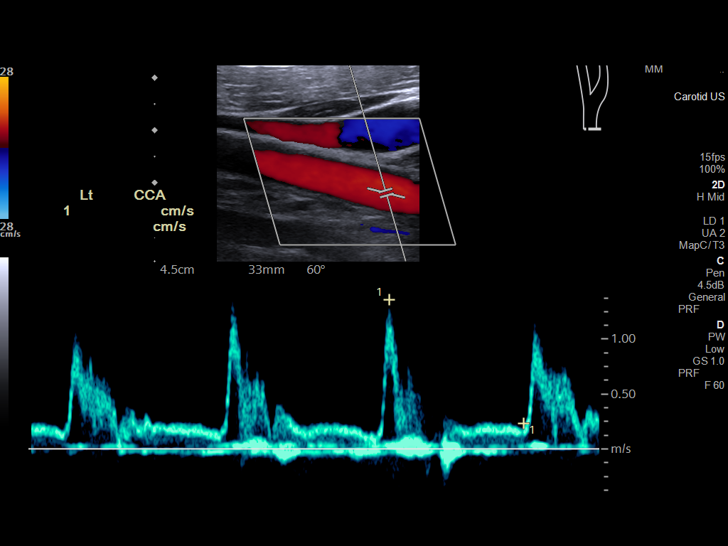
[im 47/73]
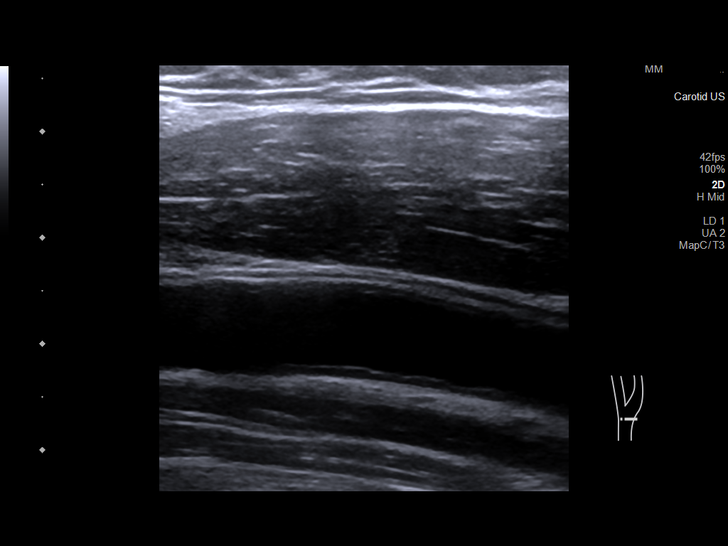
[im 54/73]
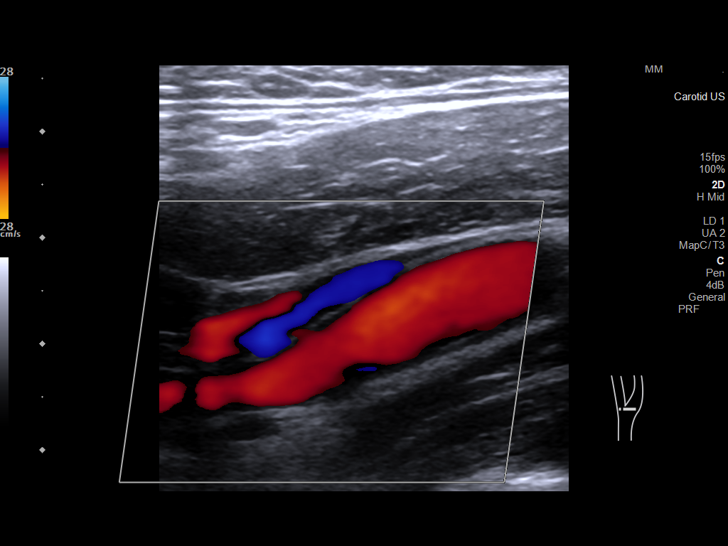
[im 60/73]
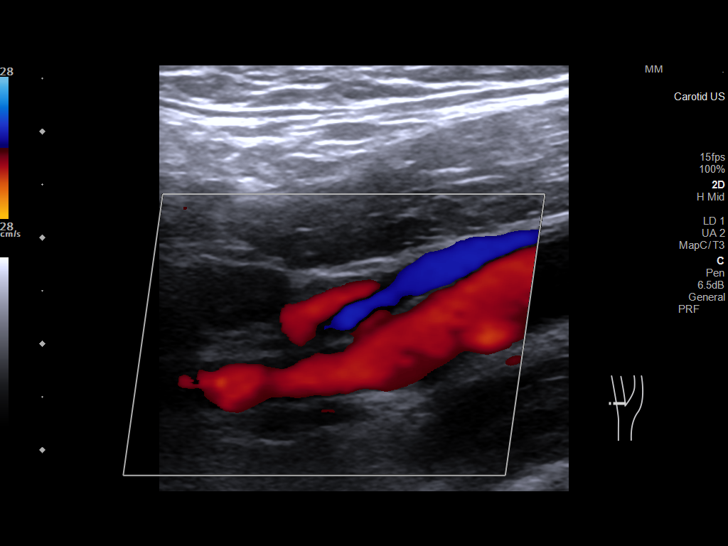
[im 66/73]
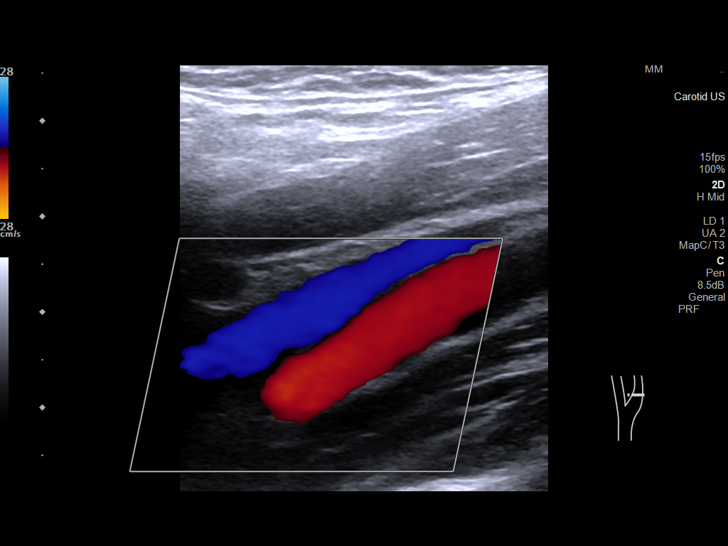
[im 73/73]
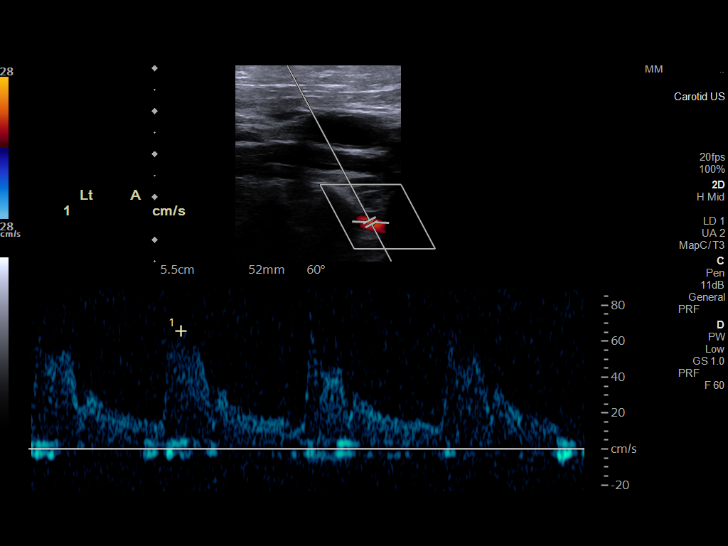

[13 of 24 positions shown; findings below may reference images not displayed]

FINDINGS: Criteria: Quantification of carotid stenosis is based on velocity
parameters that correlate the residual internal carotid diameter
with NASCET-based stenosis levels, using the diameter of the distal
internal carotid lumen as the denominator for stenosis measurement.

The following velocity measurements were obtained:

RIGHT
ICA: 103/31 cm/sec
CCA: 93/16 cm/sec

SYSTOLIC ICA/CCA RATIO:

ECA:  118 cm/sec

LEFT

ICA: 91/25 cm/sec

CCA: 129/24 cm/sec

SYSTOLIC ICA/CCA RATIO:

ECA:  124 cm/sec

RIGHT CAROTID ARTERY: No significant atherosclerotic plaque or
evidence of stenosis in the internal carotid artery.

RIGHT VERTEBRAL ARTERY:  Patent with normal antegrade flow.

LEFT CAROTID ARTERY: No significant atherosclerotic plaque or
stenosis in the internal carotid artery.

LEFT VERTEBRAL ARTERY:  Patent with normal antegrade flow.
IMPRESSION: 1. No significant atherosclerotic plaque or stenosis in the internal
carotid arteries.
2. Vertebral arteries are patent with normal antegrade flow.

## 2021-11-30 IMAGING — CT CT ANGIO HEAD-NECK (W OR W/O PERF)
2 of 7 series · 8 of 33 positions shown · IV contrast (Omnipaque or Isovue)
Comparison: Prior head CT from earlier the same day.

CLINICAL DATA: Initial evaluation for neuro deficit, stroke,
left-sided weakness.

EXAM:
CT ANGIOGRAPHY HEAD AND NECK
TECHNIQUE: Multidetector CT imaging of the head and neck was performed using
the standard protocol during bolus administration of intravenous
contrast. Multiplanar CT image reconstructions and MIPs were
obtained to evaluate the vascular anatomy. Carotid stenosis
measurements (when applicable) are obtained utilizing NASCET
criteria, using the distal internal carotid diameter as the
denominator.
CONTRAST:  100mL OMNIPAQUE IOHEXOL 350 MG/ML SOLN

[Series 4: cta head & neck · axial · 0.53mm/px · z∈[-54,+72]mm · 2 of 189 slices shown]
[im 63/189  soft-tissue]
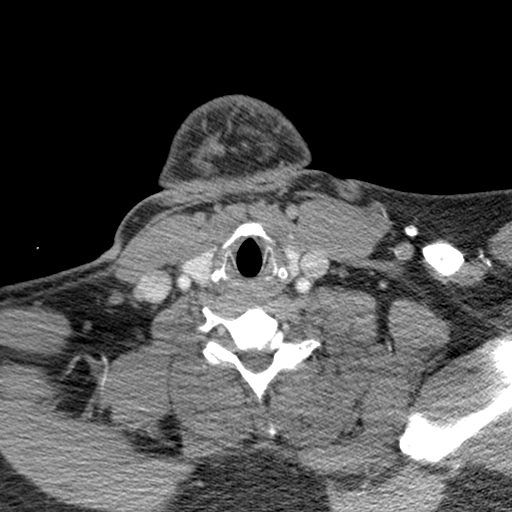
[im 126/189  soft-tissue]
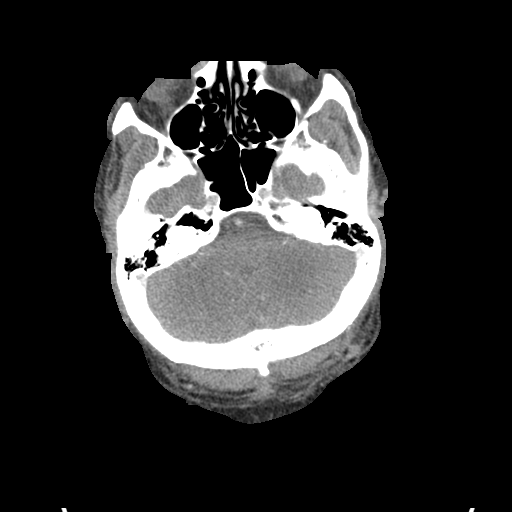

[Series 6: ax thin · axial · 0.39mm/px · z∈[-125,+141]mm · 6 of 374 slices shown]
[im 54/374  soft-tissue]
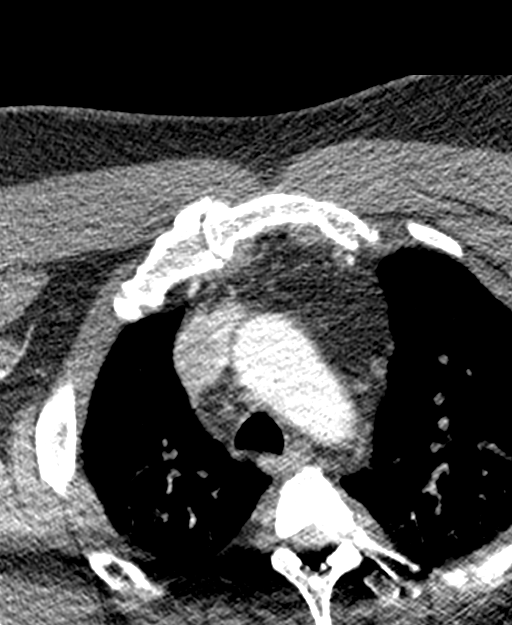
[im 107/374  bone]
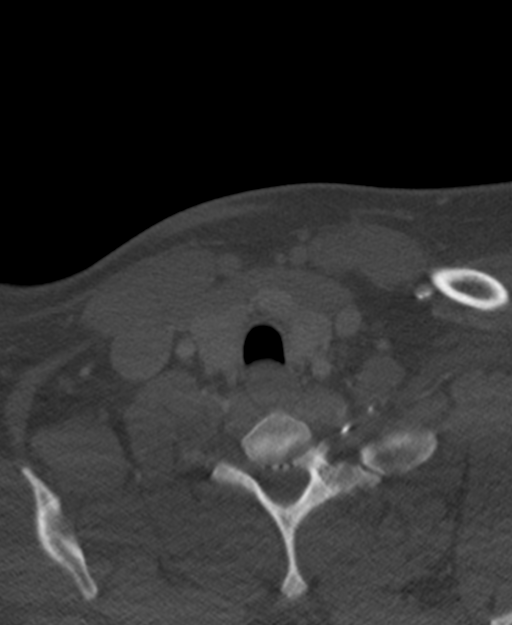
[im 160/374  soft-tissue]
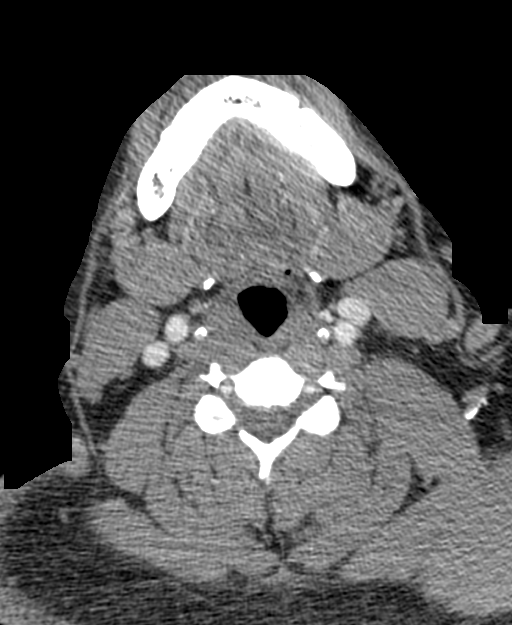
[im 214/374  bone]
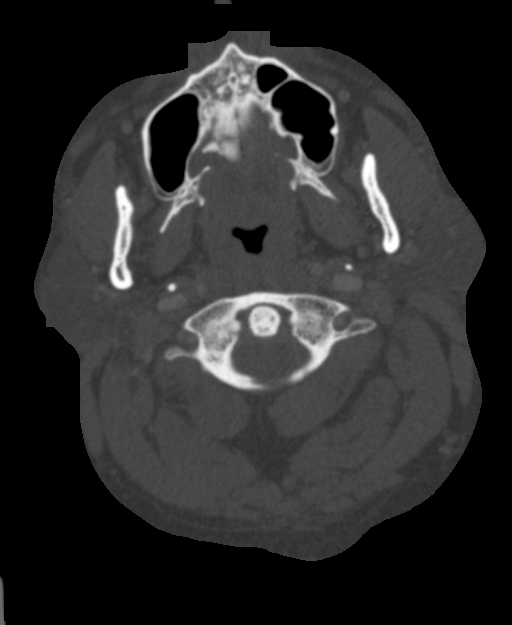
[im 267/374  soft-tissue]
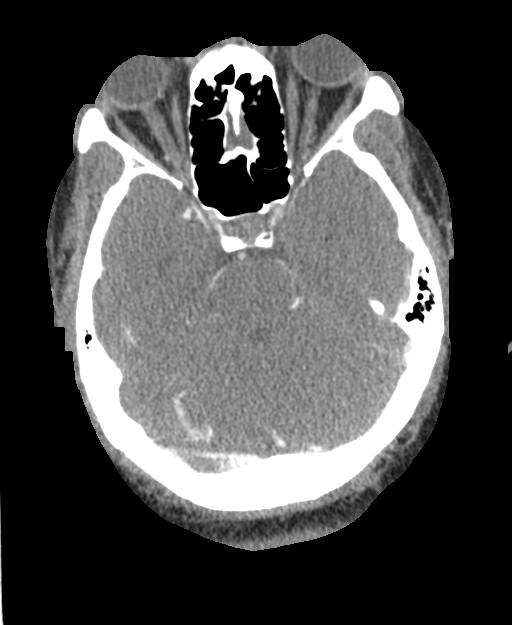
[im 320/374  bone]
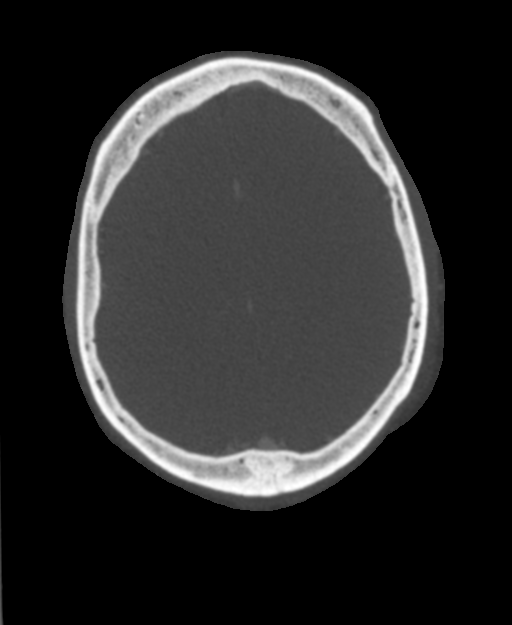

[8 of 33 positions shown; findings below may reference images not displayed]

FINDINGS: CTA NECK FINDINGS

Aortic arch: Examination severely limited due to poor timing of the
contrast bolus, with little contrast within the arterial system.
Additionally, evaluation somewhat limited by habitus.

Visualized aortic arch normal in caliber with normal branch pattern.
Minimal plaque within the arch itself. No stenosis about the origin
of the great vessels.

Right carotid system: Right common and internal carotid arteries
grossly patent to the skull base without visible stenosis or
occlusion. No obvious dissection or other acute vascular
abnormality.

Left carotid system: Left common and internal carotid arteries
patent without visible stenosis or occlusion. No obvious dissection
or other acute vascular abnormality.

Vertebral arteries: Both vertebral arteries arise from the
subclavian arteries. No proximal subclavian artery stenosis.
Vertebral arteries grossly patent within the neck without visible
stenosis or occlusion. No obvious dissection or other abnormality.
Osteophytic overgrowth at the left C6 transverse foramen with mild
mass effect on the adjacent left V2 segment noted (series 4, image
65).

Skeleton: No visible acute osseous finding. No discrete or worrisome
osseous lesions.

Other neck: No other acute soft tissue abnormality within the neck.
No mass or adenopathy.

Upper chest: Visualized upper chest demonstrates no acute finding.

Review of the MIP images confirms the above findings

CTA HEAD FINDINGS

Anterior circulation: Evaluation of the intracranial circulation
severely limited with little contrast within the arterial system.

ICAs grossly patent to the termini without visible stenosis. Left A1
grossly patent. Suspected hypoplastic and/or absent right A1.
Grossly negative anterior communicating artery complex. ACAs grossly
perfused bilaterally. M1 segments grossly patent without visible
stenosis. Grossly negative MCA bifurcations. No visible proximal MCA
branch occlusion, although evaluation of the MCA branches is fairly
limited on this exam.

Posterior circulation: Both V4 segments patent to the
vertebrobasilar junction without visible stenosis. Both PICA origins
patent. Short-segment fenestration of the proximal basilar artery
noted. Basilar patent to its distal aspect without visible stenosis
superior cerebral arteries patent at their origins. Both PCAs appear
to be primarily supplied via the basilar. PCAs grossly perfused
bilaterally without visible stenosis.

Venous sinuses: Grossly patent allowing for timing the contrast
bolus.

Anatomic variants: Probable hypoplastic/absent right A1 segment.

Review of the MIP images confirms the above findings
IMPRESSION: 1. Severely limited exam due to poor timing of the contrast bolus.
2. Grossly negative CTA of the head and neck. No visible large
vessel occlusion or hemodynamically significant stenosis. No other
obvious acute vascular abnormality.

## 2022-03-19 ENCOUNTER — Encounter: Payer: Self-pay | Admitting: Urology

## 2022-03-19 ENCOUNTER — Ambulatory Visit (INDEPENDENT_AMBULATORY_CARE_PROVIDER_SITE_OTHER): Payer: No Typology Code available for payment source | Admitting: Urology

## 2022-03-19 VITALS — BP 145/85 | HR 101

## 2022-03-19 DIAGNOSIS — C61 Malignant neoplasm of prostate: Secondary | ICD-10-CM

## 2022-03-19 NOTE — Progress Notes (Signed)
Assessment: 1. Prostate cancer ([place]); high risk; PSA 8.27; Gleason grade group 2,3,5     Plan: I reviewed all of his records (82 pages) from the [facility] regarding his evaluation and diagnosis of prostate cancer.  These records included lab results, imaging results, pathology results, and office notes. I spent a total of 45 minutes counseling Mark Anthony regarding the diagnosis of high risk prostate cancer.  I reviewed his biopsy results with him today.   Using the [facility] prostate cancer nomogram, I sided a probability of 15 percent for localized prostate cancer, 83 percent of extracapsular extension, 40 percent of seminal vesicle involvement, and 38 percent of lymph node involvement.  I discussed the diagnosis of localized prostate cancer in the natural history of prostate cancer. I then discussed treatment options for high risk prostate cancer.  Specifically, I discussed radical prostatectomy (RALP, RRP, RPP), external beam radiation, and androgen deprivation therapy.  I discussed the risk and benefits of each treatment.  I discussed the potential risk of impotence and incontinence as they relate to treatment of prostate cancer.  I discussed the potential need for multimodal therapy given his high risk prostate cancer.  Questions were answered.  The patient was given literature regarding prostate cancer to review.   I recommended referral to the multidisciplinary prostate cancer clinic in [place].  He is amenable to this. We will need to get results from his PSMA PET scan.  He will have these faxed to my office. Return to office in 6 weeks.  Chief Complaint:  Chief Complaint  Patient presents with   Prostate Cancer    History of Present Illness:  Mark Anthony is a 46 y.o. year old male who is seen in consultation from Hanley Hays, NP for evaluation of high risk prostate cancer. He presented to urology at the [facility] in [place] in April 2023 with an elevated PSA.  His PSA from 09/04/2021 was 8.27.   A prior PSA from 02/17/2020 was 3.03. Prostate MRI from 12/20/2021 demonstrated a volume of 17 mL, a PI-RADS 5 lesion in the left peripheral zone, no evidence of extracapsular extension, seminal vesicle involvement, or adenopathy. He underwent a transrectal ultrasound and biopsy of the prostate on 01/29/2022.  The biopsy showed Gleason 4+5 adenocarcinoma in the right base and right mid gland, Gleason 4+3 adenocarcinoma in the left base and left mid gland, and Gleason 3+4 adenocarcinoma in the right apex, left apex, and left target lesion.  A total of 12/15 cores were positive. He was evaluated with a PSMA PET scan on 03/18/2022.  Results not available.  He has a family history of prostate cancer with his father.  He does not have any significant lower urinary tract symptoms.  No dysuria or gross hematuria.   Past Medical History:  Past Medical History:  Diagnosis Date   Diabetes mellitus without complication ([place])    Hypertension    no meds at this time.   Reflux    TIA (transient ischemic attack)    no meds following    Past Surgical History:  Past Surgical History:  Procedure Laterality Date   ACHILLES TENDON SURGERY Right 02/02/2020   Procedure: Right Achilles tendon debridement and reconstruction;  Surgeon: Wylene Simmer, MD;  Location: [place];  Service: Orthopedics;  Laterality: Right;   ACHILLES TENDON SURGERY Right 12/13/2020   Procedure: Right Achilles Tendon Reconstruction;  Surgeon: Wylene Simmer, MD;  Location: [facility];  Service: Orthopedics;  Laterality: Right;   ELBOW FRACTURE  SURGERY     GASTRIC FUNDOPLICATION     GASTROC RECESSION EXTREMITY Right 12/13/2020   Procedure: Gastroc Recession;  Surgeon: Wylene Simmer, MD;  Location: [facility];  Service: Orthopedics;  Laterality: Right;    Allergies:  Allergies  Allergen Reactions   Contrast Media [Iodinated Contrast Media] Nausea And Vomiting and Swelling    Pt. Vomited  immediately after IV dye injection.  Pt. Then had throat swelling and edema to uvula.     Acetaminophen     Other reaction(s): Sweating   Vicodin [Hydrocodone-Acetaminophen] Nausea And Vomiting    Family History:  Family History  Problem Relation Age of Onset   Prostate cancer Father     Social History:  Social History   Tobacco Use   Smoking status: Former    Packs/day: 0.25    Types: Cigarettes, Cigars   Smokeless tobacco: Never  Vaping Use   Vaping Use: Never used  Substance Use Topics   Alcohol use: Yes    Comment: occasional   Drug use: No    Review of symptoms:  Constitutional:  Negative for unexplained weight loss, night sweats, fever, chills ENT:  Negative for nose bleeds, sinus pain, painful swallowing CV:  Negative for chest pain, shortness of breath, exercise intolerance, palpitations, loss of consciousness Resp:  Negative for cough, wheezing, shortness of breath GI:  Negative for nausea, vomiting, diarrhea, bloody stools GU:  Positives noted in HPI; otherwise negative for gross hematuria, dysuria, urinary incontinence Neuro:  Negative for seizures, poor balance, limb weakness, slurred speech Psych:  Negative for lack of energy, depression, anxiety Endocrine:  Negative for polydipsia, polyuria, symptoms of hypoglycemia (dizziness, hunger, sweating) Hematologic:  Negative for anemia, purpura, petechia, prolonged or excessive bleeding, use of anticoagulants  Allergic:  Negative for difficulty breathing or choking as a result of exposure to anything; no shellfish allergy; no allergic response (rash/itch) to materials, foods  Physical exam: BP (!) 145/85   Pulse (!) 101  GENERAL APPEARANCE:  Well appearing, well developed, well nourished, NAD HEENT: Atraumatic, Normocephalic, oropharynx clear. NECK: Supple without lymphadenopathy or thyromegaly. LUNGS: Clear to auscultation bilaterally. HEART: Regular Rate and Rhythm without murmurs, gallops, or rubs. ABDOMEN:  Soft, non-tender, No Masses. EXTREMITIES: Moves all extremities well.  Without clubbing, cyanosis, or edema. NEUROLOGIC:  Alert and oriented x 3, normal gait, CN II-XII grossly intact.  MENTAL STATUS:  Appropriate. BACK:  Non-tender to palpation.  No CVAT SKIN:  Warm, dry and intact.   GU: Penis:  uncircumcised Meatus: slight stenosis Scrotum: normal, no masses Testis: normal without masses bilateral Epididymis: normal Prostate: 30 g, NT, no nodules palpated, unable to feel base Rectum: Normal tone,  no masses or tenderness   Results: No specimen provided

## 2022-03-27 ENCOUNTER — Other Ambulatory Visit: Payer: Self-pay | Admitting: Radiation Oncology

## 2022-03-27 ENCOUNTER — Ambulatory Visit
Admission: RE | Admit: 2022-03-27 | Discharge: 2022-03-27 | Disposition: A | Discharge status: Home / Self Care | Payer: Self-pay | Source: Ambulatory Visit | Attending: Radiation Oncology | Admitting: Radiation Oncology

## 2022-03-27 DIAGNOSIS — C61 Malignant neoplasm of prostate: Secondary | ICD-10-CM

## 2022-04-07 NOTE — Progress Notes (Signed)
GU Location of Tumor / Histology: Prostate Ca  If Prostate Cancer, Gleason Score is (4 + 5) and PSA is (16.4 as of 03/06/2022, 8.27 on 09/04/2021)  Biopsies:          Past/Anticipated interventions by urology, if any: NA  Past/Anticipated interventions by medical oncology, if any:  NA  Weight changes, if any:  No  IPSS:  2 SHIM:  24  Bowel/Bladder complaints, if any:  No  Nausea/Vomiting, if any: No  Pain issues, if any:  6/10 right ankle and right knee wears brace to prevent foot drop.  Denies use of cane or walker.  SAFETY ISSUES: Prior radiation?  No Pacemaker/ICD? No Possible current pregnancy? Male Is the patient on methotrexate? No  Current Complaints / other details:  Need more information on treatment options.  Patient verbalized just ready to get started and to put this behind him.

## 2022-04-08 ENCOUNTER — Ambulatory Visit
Admission: RE | Admit: 2022-04-08 | Discharge: 2022-04-08 | Disposition: A | Discharge status: Home / Self Care | Payer: Non-veteran care | Source: Ambulatory Visit | Attending: Radiation Oncology | Admitting: Radiation Oncology

## 2022-04-08 ENCOUNTER — Other Ambulatory Visit: Payer: Self-pay

## 2022-04-08 DIAGNOSIS — C61 Malignant neoplasm of prostate: Secondary | ICD-10-CM

## 2022-04-08 NOTE — Progress Notes (Signed)
Radiation Oncology         [phone] ________________________________  Initial Outpatient Consultation - Conducted via MyChart due to current COVID-19 concerns for limiting patient exposure  Name: Mark Anthony MRN: [id number]  Date: 04/08/2022  DOB: Feb 17, 1976  CC:Pcp, No  Collier Bullock, MD   REFERRING PHYSICIAN: Collier Bullock, MD  DIAGNOSIS: 46 y.o. gentleman with locally advanced prostate cancer with Gleason score of 4+5, and PSA of 16.4.    ICD-10-CM   1. Malignant neoplasm of prostate ([place])  C61       HISTORY OF PRESENT ILLNESS: Mark Anthony is a 46 y.o. male with a diagnosis of prostate cancer. He was noted to have an elevated PSA of 8.27 by his primary care physician with the [facility]. Prior PSA in 01/2020 was 3.03. Accordingly, he was referred for evaluation in urology by Hanley Hays, FNP on 11/07/21,  digital rectal examination was performed at that time revealing no nodules in the palpable area. He underwent prostate MRI on 12/20/21 showing a 15 mm PI-RADS 5 lesion in the left posterior medial and lateral peripheral zone at mid gland to apex without frank extraglandular extension or adenopathy noted.  An MRI fusion biopsy of the prostate was performed on 01/28/22.  The prostate volume measured 36 cc.  Out of 15 core biopsies, 12 were positive.  The maximum Gleason score was 4+5, and this was seen in one right base sample and one right mid sample (with perineural invasion). Additionally, Gleason 4+3 was seen in both left base cores (with PNI) and one left mid core and Gleason 3+4 in both ROI samples, all 3 left apex samples, and both right apex cores. Repeat PSA obtained on 03/06/22 showed a significant jump to 16.4.  He underwent staging PSMA PET scan on 03/18/22 showing marked focal uptake in the left posteroinferior prostate as well as marked uptake within enlarged obturator and left external iliac lymph nodes but no osseous lesions with worrisome uptake.  He met with Dr. Merrie Roof  in medical oncology at the [facility] and the recommendations are to proceed with ADT and androgen blockade, concurrent with radiation or prostatectomy.  Initially, he was most interested in surgical management but after giving further consideration, he has decided that he would prefer to proceed with definitive radiation treatment.  He met with Dr. Ali Lowe in radiation oncology who agrees with this plan but the patient prefers to have his radiation treatments closer to home.  The patient reviewed the biopsy and imaging results with his urologist and he has kindly been referred today for discussion of potential radiation treatment options.   PREVIOUS RADIATION THERAPY: No  PAST MEDICAL HISTORY:  Past Medical History:  Diagnosis Date   Diabetes mellitus without complication ([place])    Hypertension    Reflux    TIA (transient ischemic attack)    no meds following      PAST SURGICAL HISTORY: Past Surgical History:  Procedure Laterality Date   ACHILLES TENDON SURGERY Right 02/02/2020   Procedure: Right Achilles tendon debridement and reconstruction;  Surgeon: Wylene Simmer, MD;  Location: [place];  Service: Orthopedics;  Laterality: Right;   ACHILLES TENDON SURGERY Right 12/13/2020   Procedure: Right Achilles Tendon Reconstruction;  Surgeon: Wylene Simmer, MD;  Location: [place];  Service: Orthopedics;  Laterality: Right;   ELBOW FRACTURE SURGERY     GASTRIC FUNDOPLICATION     GASTROC RECESSION EXTREMITY Right 12/13/2020   Procedure: Gastroc Recession;  Surgeon: Wylene Simmer, MD;  Location: [place];  Service: Orthopedics;  Laterality: Right;    FAMILY HISTORY:  Family History  Problem Relation Age of Onset   Prostate cancer Father     SOCIAL HISTORY:  Social History   Socioeconomic History   Marital status: Single    Spouse name: Not on file   Number of children: Not on file   Years of education: Not on file   Highest education level:  Not on file  Occupational History   Not on file  Tobacco Use   Smoking status: Former    Packs/day: 0.25    Types: Cigarettes, Cigars   Smokeless tobacco: Never  Vaping Use   Vaping Use: Never used  Substance and Sexual Activity   Alcohol use: Yes    Comment: occasional   Drug use: No   Sexual activity: Not on file  Other Topics Concern   Not on file  Social History Narrative   Not on file   Social Determinants of Health   Financial Resource Strain: Not on file  Food Insecurity: Not on file  Transportation Needs: Not on file  Physical Activity: Not on file  Stress: Not on file  Social Connections: Not on file  Intimate Partner Violence: Not on file    ALLERGIES: Contrast media [iodinated contrast media], Acetaminophen, and Vicodin [hydrocodone-acetaminophen]  MEDICATIONS:  Current Outpatient Medications  Medication Sig Dispense Refill   amLODipine (NORVASC) 10 MG tablet Take 1 tablet (10 mg total) by mouth daily. 30 tablet 11   amLODipine (NORVASC) 10 MG tablet Take 1 tablet by mouth daily.     atorvastatin (LIPITOR) 10 MG tablet TAKE ONE TABLET BY MOUTH AT BEDTIME FOR CHOLESTEROL     cetirizine (ZYRTEC) 10 MG tablet TAKE ONE TABLET BY MOUTH DAILY AS NEEDED FOR ALLERGIES     meloxicam (MOBIC) 7.5 MG tablet meloxicam 7.5 mg tablet  Take 1 tablet PO twice a day PRN for severe pain     meloxicam (MOBIC) 7.5 MG tablet TAKE ONE TABLET BY MOUTH TWICE A DAY AS NEEDED FOR SEVERE PAIN     acetaminophen (TYLENOL) 325 MG tablet Take 2 tablets (650 mg total) by mouth every 6 (six) hours as needed for mild pain or headache (or Fever >/= 101). 12 tablet 2   aspirin EC 325 MG tablet Take 1 tablet (325 mg total) by mouth daily with breakfast. 30 tablet 12   atorvastatin (LIPITOR) 40 MG tablet Take 1 tablet (40 mg total) by mouth daily. 30 tablet 3   methocarbamol (ROBAXIN) 500 MG tablet methocarbamol 500 mg tablet  Take 1 tablet every 8 hours by oral route as needed for spasm for 10  days.     No current facility-administered medications for this encounter.    REVIEW OF SYSTEMS:  On review of systems, the patient reports that he is doing well overall. He denies any chest pain, shortness of breath, cough, fevers, chills, night sweats, unintended weight changes. He denies any bowel disturbances, and denies abdominal pain, nausea or vomiting. He denies any new musculoskeletal or joint aches or pains. His IPSS was 2, indicating mild urinary symptoms. His SHIM was 24, indicating he does not have erectile dysfunction. A complete review of systems is obtained and is otherwise negative.    PHYSICAL EXAM:  Wt Readings from Last 3 Encounters:  12/13/20 287 lb 7.7 oz (130.4 kg)  02/02/20 282 lb 6.6 oz (128.1 kg)  04/12/16 275 lb (124.7 kg)   Temp Readings from Last  3 Encounters:  05/24/21 98 F (36.7 C) (Oral)  12/13/20 98.2 F (36.8 C)  02/02/20 98 F (36.7 C)   BP Readings from Last 3 Encounters:  03/19/22 (!) 145/85  05/24/21 (!) 156/92  12/13/20 (!) 151/98   Pulse Readings from Last 3 Encounters:  03/19/22 (!) 101  05/24/21 72  12/13/20 84   Pain Assessment Pain Score: 6  Pain Loc: Ankle (right ankle and right knee)/10  In general this is a well appearing African-American male in no acute distress. He's alert and oriented x4 and appropriate throughout the examination. Cardiopulmonary assessment is negative for acute distress, and he exhibits normal effort.     KPS = 100  100 - Normal; no complaints; no evidence of disease. 90   - Able to carry on normal activity; minor signs or symptoms of disease. 80   - Normal activity with effort; some signs or symptoms of disease. 89   - Cares for self; unable to carry on normal activity or to do active work. 60   - Requires occasional assistance, but is able to care for most of his personal needs. 50   - Requires considerable assistance and frequent medical care. 26   - Disabled; requires special care and  assistance. 43   - Severely disabled; hospital admission is indicated although death not imminent. 74   - Very sick; hospital admission necessary; active supportive treatment necessary. 10   - Moribund; fatal processes progressing rapidly. 0     - Dead  Karnofsky DA, Abelmann [place], Craver LS and Burchenal [facility] [phone]) The use of the nitrogen mustards in the palliative treatment of carcinoma: with particular reference to bronchogenic carcinoma Cancer 1 634-56  LABORATORY DATA:  Lab Results  Component Value Date   WBC 15.5 (H) 05/24/2021   HGB 15.6 05/24/2021   HCT 46.0 05/24/2021   MCV 91.6 05/24/2021   PLT 358 05/24/2021   Lab Results  Component Value Date   NA 142 05/24/2021   K 3.5 05/24/2021   CL 101 05/24/2021   CO2 29 05/24/2021   Lab Results  Component Value Date   ALT 33 05/24/2021   AST 24 05/24/2021   ALKPHOS 77 05/24/2021   BILITOT 0.4 05/24/2021     RADIOGRAPHY: No results found.    IMPRESSION/PLAN: This visit was conducted via MyChart to spare the patient unnecessary potential exposure in the healthcare setting during the current COVID-19 pandemic. 1. 46 y.o. gentleman with locally advanced prostate cancer with Gleason score of 4+5, and PSA of 16.4. We discussed the patient's workup and outlined the nature of prostate cancer in this setting. The patient's PSMA scan indicates he has locally advanced disease involving two pelvic lymph nodes. Accordingly, he is eligible for a variety of potential treatment options including prostatectomy or LT-ADT concurrent with either 8 weeks of external radiation or 5 weeks of external radiation with an upfront brachytherapy boost. We discussed the available radiation techniques, and focused on the details and logistics of delivery. We discussed and outlined the risks, benefits, short and [place]-term effects associated with radiotherapy and compared and contrasted these with prostatectomy. We discussed the role of SpaceOAR gel in reducing the  rectal toxicity associated with radiotherapy. We also detailed the role of ADT in the treatment of high risk prostate cancer and outlined the associated side effects that could be expected with this therapy. He was encouraged to ask questions that were answered to his stated satisfaction.  At the conclusion of our conversation, the  patient is interested in moving forward with LT-ADT concurrent with 5 weeks of external radiation with an upfront brachytherapy boost.  He is tentatively scheduled for a consult visit with Dr. Felipa Eth on 05/07/2022 to establish care locally with urology.  However, since he has decided that he would like to proceed with the brachytherapy boost and 5 weeks of EBRT, we will see if we can get the appointment moved up sooner and have him meet with Dr. Alyson Ingles, in that same practice since he would be the one participating in the brachytherapy procedure.  We will also share our discussion and follow-up with his oncology team at the [facility] to determine how soon they can get the patient in to start ADT and androgen blockade or if they prefer that this be managed locally through the community care urologist.  Once we have a start date for his ADT, we will begin working forward from there to coordinate the seed boost procedure approximately 2 months from the start of ADT, pending Dr. Alyson Ingles is in agreement with this plan.  Approximately 3 weeks after his seed boost procedure, he will then proceed with the 5-week course of daily external beam radiation. He appears to have a good understanding of his disease and our treatment recommendations which are of curative intent and is comfortable and in agreement with the stated plan.  We enjoyed meeting him today and look forward to continuing to participate in his care.   Given current concerns for patient exposure during the COVID-19 pandemic, this encounter was conducted via video-enabled WebEx visit. The patient has given verbal consent for this  type of encounter. The time spent during this encounter was 60 minutes. The attendants for this meeting include Tyler Pita MD, Andi Devon, patient [name]  During the encounter, Tyler Pita MD and Freeman Caldron PA-C were located at [facility] Radiation Oncology Department.  Patient Rashied Corallo was located at home.     Nicholos Johns, PA-C    Tyler Pita, MD  [facility] Direct Dial: [phone]  Fax: [phone] [email or web address]  Skype  [email or web address]   This document serves as a record of services personally performed by Tyler Pita, MD and Freeman Caldron, PA-C. It was created on their behalf by Wilburn Mylar, a trained medical scribe. The creation of this record is based on the scribe's personal observations and the provider's statements to them. This document has been checked and approved by the attending provider.

## 2022-04-10 ENCOUNTER — Telehealth: Payer: Self-pay

## 2022-04-10 NOTE — Progress Notes (Signed)
Called and introduced myself to the patient as the prostate nurse navigator. Patient had a virtual visit on 6/13 to discuss his radiation treatment options.   Patient has decided to proceed with ADT and androgen blockade followed by brachytherapy and daily radiation.   Patient is now scheduled on 6/22 with Dr. Felipa Eth, and will refer to Dr. Alyson Ingles for brachytherapy consult.  Patient aware.

## 2022-04-10 NOTE — Telephone Encounter (Signed)
Marolyn Hammock oncology navigator called to see if we could have the patient come in sooner to begin ADT therapy.  They also requested the patient be scheduled with Dr. Alyson Ingles for brachytherapy consult.  Moved patient's apt up from 7/12 to 6/22 with you.

## 2022-04-10 NOTE — Progress Notes (Signed)
Pt was consult on 6/13 for locally advanced prostate cancer with Gleason score of 4+5, and PSA of 16.4.  Patient has decided to proceed with LT-ADT concurrent with 5 weeks of external radiation with an upfront brachytherapy boost.   RN left voicemail with nurse triage at [facility] to inquire about starting ADT and consult with Dr. Alyson Ingles for brachytherapy.   Also, voicemail left with [facility] nurse navigator, to finalize if they would like to start ADT and androgen blockade or if they prefer that it be done here locally with urology.

## 2022-04-11 NOTE — Progress Notes (Signed)
Per [facility], patient is covered to start ADT and Androgen blockade therapy under the care of local urologist.

## 2022-04-11 NOTE — Progress Notes (Signed)
RN left additional voicemail with [facility] urology nurse navigator at [phone] requesting call back.

## 2022-04-17 ENCOUNTER — Ambulatory Visit (INDEPENDENT_AMBULATORY_CARE_PROVIDER_SITE_OTHER): Payer: No Typology Code available for payment source | Admitting: Urology

## 2022-04-17 ENCOUNTER — Encounter: Payer: Self-pay | Admitting: Urology

## 2022-04-17 VITALS — BP 157/87 | HR 94

## 2022-04-17 DIAGNOSIS — Z79818 Long term (current) use of other agents affecting estrogen receptors and estrogen levels: Secondary | ICD-10-CM | POA: Diagnosis not present

## 2022-04-17 DIAGNOSIS — C61 Malignant neoplasm of prostate: Secondary | ICD-10-CM | POA: Diagnosis not present

## 2022-04-17 MED ORDER — LEUPROLIDE ACETATE (6 MONTH) 45 MG ~~LOC~~ KIT
45.0000 mg | PACK | Freq: Once | SUBCUTANEOUS | Status: AC
  Administered 2022-04-17: 45 mg via SUBCUTANEOUS

## 2022-04-17 NOTE — Progress Notes (Signed)
Assessment: 1. Prostate cancer ([place]); high risk; PSA 8.27; Gleason grade group 2,3,5; + LN involvement on PSMA PET   2. Androgen deprivation therapy     Plan: I reviewed the PSMA PET scan results as well as the note from Dr. Tammi Klippel.  The current plan is to initiate Sporn-term ADT followed in 2 months with a brachytherapy boost and subsequent 5 weeks of IMRT. ADT initiated today with 6 month Eligard. Potential side effects of ADT discussed with the patient. Recommend daily calcium and vitamin D supplementation. Return to office in 1 month with Dr. Alyson Ingles to make arrangements for brachytherapy and SpaceOAR.  Chief Complaint:  Chief Complaint  Patient presents with   Prostate Cancer   History of Present Illness:  Mark Anthony is a 46 y.o. year old male who is seen for further evaluation of high risk prostate cancer. He presented to urology at the [facility] in [place] in April 2023 with an elevated PSA.  His PSA from 09/04/2021 was 8.27.  A prior PSA from 02/17/2020 was 3.03. Prostate MRI from 12/20/2021 demonstrated a volume of 17 mL, a PI-RADS 5 lesion in the left peripheral zone, no evidence of extracapsular extension, seminal vesicle involvement, or adenopathy. He underwent a transrectal ultrasound and biopsy of the prostate on 01/29/2022.  The biopsy showed Gleason 4+5 adenocarcinoma in the right base and right mid gland, Gleason 4+3 adenocarcinoma in the left base and left mid gland, and Gleason 3+4 adenocarcinoma in the right apex, left apex, and left target lesion.  A total of 12/15 cores were positive. He was evaluated with a PSMA PET scan on 03/18/2022 which showed marked focal uptake in the left posterior inferior aspect of the prostate gland, marked uptake within an enlarged left obturator and left external iliac lymph node consistent with nodal involvement, no osseous lesions noted.  He has a family history of prostate cancer with his father.  He does not have any significant lower  urinary tract symptoms.  No dysuria or gross hematuria.  He has consulted with Dr. Tammi Klippel in radiation oncology.  He has elected to proceed with androgen deprivation therapy concurrent with external beam radiation with upfront brachytherapy boost.  Portions of the above documentation were copied from a prior visit for review purposes only.   Past Medical History:  Past Medical History:  Diagnosis Date   Diabetes mellitus without complication ([place])    Hypertension    Reflux    TIA (transient ischemic attack)    no meds following    Past Surgical History:  Past Surgical History:  Procedure Laterality Date   ACHILLES TENDON SURGERY Right 02/02/2020   Procedure: Right Achilles tendon debridement and reconstruction;  Surgeon: Wylene Simmer, MD;  Location: [place];  Service: Orthopedics;  Laterality: Right;   ACHILLES TENDON SURGERY Right 12/13/2020   Procedure: Right Achilles Tendon Reconstruction;  Surgeon: Wylene Simmer, MD;  Location: [place];  Service: Orthopedics;  Laterality: Right;   ELBOW FRACTURE SURGERY     GASTRIC FUNDOPLICATION     GASTROC RECESSION EXTREMITY Right 12/13/2020   Procedure: Gastroc Recession;  Surgeon: Wylene Simmer, MD;  Location: [place];  Service: Orthopedics;  Laterality: Right;    Allergies:  Allergies  Allergen Reactions   Contrast Media [Iodinated Contrast Media] Nausea And Vomiting and Swelling    Pt. Vomited immediately after IV dye injection.  Pt. Then had throat swelling and edema to uvula.     Acetaminophen  Other reaction(s): Sweating   Vicodin [Hydrocodone-Acetaminophen] Nausea And Vomiting    Family History:  Family History  Problem Relation Age of Onset   Prostate cancer Father     Social History:  Social History   Tobacco Use   Smoking status: Former    Packs/day: 0.25    Types: Cigarettes, Cigars   Smokeless tobacco: Never  Vaping Use   Vaping Use: Never used   Substance Use Topics   Alcohol use: Yes    Comment: occasional   Drug use: No    ROS: Constitutional:  Negative for fever, chills, weight loss CV: Negative for chest pain, previous MI, hypertension Respiratory:  Negative for shortness of breath, wheezing, sleep apnea, frequent cough GI:  Negative for nausea, vomiting, bloody stool, GERD  Physical exam: BP (!) 157/87   Pulse 94  GENERAL APPEARANCE:  Well appearing, well developed, well nourished, NAD HEENT:  Atraumatic, normocephalic, oropharynx clear NECK:  Supple without lymphadenopathy or thyromegaly ABDOMEN:  Soft, non-tender, no masses EXTREMITIES:  Moves all extremities well, without clubbing, cyanosis, or edema NEUROLOGIC:  Alert and oriented x 3, normal gait, CN II-XII grossly intact MENTAL STATUS:  appropriate BACK:  Non-tender to palpation, No CVAT SKIN:  Warm, dry, and intact   Results: None

## 2022-04-17 NOTE — Progress Notes (Signed)
Eligard SubQ Injection   Due to Prostate Cancer patient is present today for a Eligard Injection.  Medication: Eligard 6 month Dose: 45 mg  Location: right  Lot: [id number]   Exp: 08/28/2023  Patient tolerated well, no complications were noted  Performed by: Cianni Manny RN   

## 2022-04-23 ENCOUNTER — Ambulatory Visit: Payer: Non-veteran care | Admitting: Urology

## 2022-04-23 ENCOUNTER — Telehealth: Payer: Self-pay | Admitting: *Deleted

## 2022-04-23 NOTE — Telephone Encounter (Signed)
CALLED PATIENT TO ASK QUESTIONS, LVM FOR A RETURN CALL 

## 2022-04-24 ENCOUNTER — Telehealth: Payer: Self-pay | Admitting: *Deleted

## 2022-04-24 NOTE — Telephone Encounter (Signed)
Called patient to ask questions, spoke with patient 

## 2022-04-30 ENCOUNTER — Telehealth: Payer: Self-pay | Admitting: *Deleted

## 2022-04-30 NOTE — Progress Notes (Signed)
RN spoke with [facility] @ [facility] Oncology clinic.   Pt will follow up on 05/22/2022.   RN faxed recent consults notes from [place] and Urology.

## 2022-04-30 NOTE — Telephone Encounter (Signed)
CALLED PATIENT TO UPDATE, LVM FOR A RETURN CALL 

## 2022-05-01 ENCOUNTER — Telehealth: Payer: Self-pay | Admitting: *Deleted

## 2022-05-01 NOTE — Telephone Encounter (Signed)
Called patient to inform of implant date, spoke with patient and he is aware of this procedure and the date

## 2022-05-07 ENCOUNTER — Ambulatory Visit: Payer: Non-veteran care | Admitting: Urology

## 2022-05-19 ENCOUNTER — Ambulatory Visit (INDEPENDENT_AMBULATORY_CARE_PROVIDER_SITE_OTHER): Payer: No Typology Code available for payment source | Admitting: Urology

## 2022-05-19 VITALS — BP 139/84 | HR 101

## 2022-05-19 DIAGNOSIS — C61 Malignant neoplasm of prostate: Secondary | ICD-10-CM | POA: Diagnosis not present

## 2022-05-19 NOTE — Progress Notes (Signed)
05/19/2022 3:22 PM   Mark Anthony 05-15-1976 [id number]  Referring provider: No referring provider defined for this encounter.  Followup prostate cancer  HPI: Mark Anthony is a 46yo here for followup for prostate cancer. He has elected to proceed with brachytherapy and IMRT. He was started on ADT 1 month ago. He has moderate hot flashes. No significant LUTS.    PMH: Past Medical History:  Diagnosis Date   Diabetes mellitus without complication ([place])    Hypertension    Reflux    TIA (transient ischemic attack)    no meds following    Surgical History: Past Surgical History:  Procedure Laterality Date   ACHILLES TENDON SURGERY Right 02/02/2020   Procedure: Right Achilles tendon debridement and reconstruction;  Surgeon: Wylene Simmer, MD;  Location: [place];  Service: Orthopedics;  Laterality: Right;   ACHILLES TENDON SURGERY Right 12/13/2020   Procedure: Right Achilles Tendon Reconstruction;  Surgeon: Wylene Simmer, MD;  Location: [place];  Service: Orthopedics;  Laterality: Right;   ELBOW FRACTURE SURGERY     GASTRIC FUNDOPLICATION     GASTROC RECESSION EXTREMITY Right 12/13/2020   Procedure: Gastroc Recession;  Surgeon: Wylene Simmer, MD;  Location: [place];  Service: Orthopedics;  Laterality: Right;    Home Medications:  Allergies as of 05/19/2022       Reactions   Contrast Media [iodinated Contrast Media] Nausea And Vomiting, Swelling   Pt. Vomited immediately after IV dye injection.  Pt. Then had throat swelling and edema to uvula.     Acetaminophen    Other reaction(s): Sweating   Vicodin [hydrocodone-acetaminophen] Nausea And Vomiting        Medication List        Accurate as of May 19, 2022  3:22 PM. If you have any questions, ask your nurse or doctor.          acetaminophen 325 MG tablet Commonly known as: TYLENOL Take 2 tablets (650 mg total) by mouth every 6 (six) hours as needed for mild pain or  headache (or Fever >/= 101).   amLODipine 10 MG tablet Commonly known as: NORVASC Take 1 tablet (10 mg total) by mouth daily.   aspirin EC 325 MG tablet Take 1 tablet (325 mg total) by mouth daily with breakfast.   atorvastatin 40 MG tablet Commonly known as: LIPITOR Take 1 tablet (40 mg total) by mouth daily.   atorvastatin 10 MG tablet Commonly known as: LIPITOR TAKE ONE TABLET BY MOUTH AT BEDTIME FOR CHOLESTEROL   cetirizine 10 MG tablet Commonly known as: ZYRTEC TAKE ONE TABLET BY MOUTH DAILY AS NEEDED FOR ALLERGIES   meloxicam 7.5 MG tablet Commonly known as: MOBIC TAKE ONE TABLET BY MOUTH TWICE A DAY AS NEEDED FOR SEVERE PAIN   methocarbamol 500 MG tablet Commonly known as: ROBAXIN methocarbamol 500 mg tablet  Take 1 tablet every 8 hours by oral route as needed for spasm for 10 days.   nicotine 7 mg/24hr patch Commonly known as: NICODERM CQ - dosed in mg/24 hr APPLY 1 PATCH TO SKIN ONCE A DAY FOR NICOTINE REPLACEMENT THERAPY *APPLY TO NON-HAIRY, CLEAN, DRY AREA (NO TOBACCO PRODUCTS)*        Allergies:  Allergies  Allergen Reactions   Contrast Media [Iodinated Contrast Media] Nausea And Vomiting and Swelling    Pt. Vomited immediately after IV dye injection.  Pt. Then had throat swelling and edema to uvula.     Acetaminophen     Other  reaction(s): Sweating   Vicodin [Hydrocodone-Acetaminophen] Nausea And Vomiting    Family History: Family History  Problem Relation Age of Onset   Prostate cancer Father     Social History:  reports that he has quit smoking. His smoking use included cigarettes and cigars. He smoked an average of .25 packs per day. He has never used smokeless tobacco. He reports current alcohol use. He reports that he does not use drugs.  ROS: All other review of systems were reviewed and are negative except what is noted above in HPI  Physical Exam: BP 139/84   Pulse (!) 101   Constitutional:  Alert and oriented, No acute  distress. HEENT: [place] AT, moist mucus membranes.  Trachea midline, no masses. Cardiovascular: No clubbing, cyanosis, or edema. Respiratory: Normal respiratory effort, no increased work of breathing. GI: Abdomen is soft, nontender, nondistended, no abdominal masses GU: No CVA tenderness.  Lymph: No cervical or inguinal lymphadenopathy. Skin: No rashes, bruises or suspicious lesions. Neurologic: Grossly intact, no focal deficits, moving all 4 extremities. Psychiatric: Normal mood and affect.  Laboratory Data: Lab Results  Component Value Date   WBC 15.5 (H) 05/24/2021   HGB 15.6 05/24/2021   HCT 46.0 05/24/2021   MCV 91.6 05/24/2021   PLT 358 05/24/2021    Lab Results  Component Value Date   CREATININE 1.00 05/24/2021    No results found for: "PSA"  No results found for: "TESTOSTERONE"  Lab Results  Component Value Date   HGBA1C 7.0 (H) 05/24/2021    Urinalysis    Component Value Date/Time   COLORURINE STRAW (A) 05/24/2021 0303   APPEARANCEUR CLEAR 05/24/2021 0303   LABSPEC 1.029 05/24/2021 0303   PHURINE 7.0 05/24/2021 0303   GLUCOSEU 50 (A) 05/24/2021 0303   HGBUR NEGATIVE 05/24/2021 0303   BILIRUBINUR NEGATIVE 05/24/2021 0303   KETONESUR NEGATIVE 05/24/2021 0303   PROTEINUR NEGATIVE 05/24/2021 0303   NITRITE NEGATIVE 05/24/2021 0303   LEUKOCYTESUR NEGATIVE 05/24/2021 0303    No results found for: "LABMICR", "WBCUA", "RBCUA", "LABEPIT", "MUCUS", "BACTERIA"  Pertinent Imaging:  No results found for this or any previous visit.  No results found for this or any previous visit.  No results found for this or any previous visit.  No results found for this or any previous visit.  No results found for this or any previous visit.  No results found for this or any previous visit.  No results found for this or any previous visit.  No results found for this or any previous visit.   Assessment & Plan:    1. Prostate cancer ([place]); high risk; PSA 8.27; Gleason  grade group 2,3,5; + LN involvement on PSMA PET I discussed the natural history of high risk prostate cancer with the patient and the various treatment options including active surveillance, RALP, IMRT, brachytherapy, cryotherapy, HIFU and ADT. After discussing the options the patient elects for brachytherapy boost in combination with IMRT.    No follow-ups on file.  Nicolette Bang, MD  [facility] [place]

## 2022-05-26 ENCOUNTER — Encounter: Payer: Self-pay | Admitting: Urology

## 2022-05-26 NOTE — Patient Instructions (Signed)

## 2022-06-10 ENCOUNTER — Encounter (HOSPITAL_BASED_OUTPATIENT_CLINIC_OR_DEPARTMENT_OTHER): Payer: Self-pay

## 2022-06-10 ENCOUNTER — Telehealth: Payer: Self-pay | Admitting: *Deleted

## 2022-06-10 DIAGNOSIS — G4730 Sleep apnea, unspecified: Secondary | ICD-10-CM

## 2022-06-10 NOTE — Telephone Encounter (Signed)
CALLED PATIENT TO REMIND OF PRE-SEED APPTS. FOR 06-12-22- ARRIVAL TIME- 7:45 AM, SPOKE WITH PATIENT AND HE IS AWARE OF THESE APPTS.

## 2022-06-11 NOTE — Progress Notes (Signed)
  Radiation Oncology         [phone]) [phone] ________________________________  Name: Mark Anthony MRN: [id number]  Date: 06/12/2022  DOB: July 21, 1976  SIMULATION AND TREATMENT PLANNING NOTE PUBIC ARCH STUDY  CC:Pcp, No  Collier Bullock, MD  DIAGNOSIS: 46 y.o. gentleman with locally advanced prostate cancer with Gleason score of 4+5, and PSA of 16.4.  Oncology History  Prostate cancer [facility]); high risk; PSA 8.27; Gleason grade group 2,3,5  03/18/2022 Cancer Staging   Staging form: Prostate, AJCC 8th Edition - Clinical stage from 03/18/2022: Stage IVA (cT1c, cN1, cM0, PSA: 16.4, Grade Group: 5) - Signed by Freeman Caldron, PA-C on 04/08/2022 Histopathologic type: Adenocarcinoma, NOS Prostate specific antigen (PSA) range: 10 to 19 Gleason primary pattern: 4 Gleason secondary pattern: 5 Gleason score: 9 Histologic grading system: 5 grade system Number of biopsy cores examined: 15 Number of biopsy cores positive: 12 Location of positive needle core biopsies: Both sides   03/19/2022 Initial Diagnosis   Prostate cancer ([place]); high risk; PSA 8.27; Gleason grade group 2,3,5       ICD-10-CM   1. Prostate cancer ([place]); high risk; PSA 8.27; Gleason grade group 2,3,5  C61       COMPLEX SIMULATION:  The patient presented today for evaluation for possible prostate seed implant. He was brought to the radiation planning suite and placed supine on the CT couch. A 3-dimensional image study set was obtained in upload to the planning computer. There, on each axial slice, I contoured the prostate gland. Then, using three-dimensional radiation planning tools I reconstructed the prostate in view of the structures from the transperineal needle pathway to assess for possible pubic arch interference. In doing so, I did not appreciate any pubic arch interference. Also, the patient's prostate volume was estimated based on the drawn structure. The volume was 33 cc.  Given the pubic arch appearance and prostate  volume, patient remains a good candidate to proceed with prostate seed implant. Today, he freely provided informed written consent to proceed.    PLAN: The patient will undergo prostate seed implant for boost to be followed by 5 weeks of daily EBRT.   ________________________________  Sheral Apley. Tammi Klippel, M.D.

## 2022-06-12 ENCOUNTER — Other Ambulatory Visit: Payer: Self-pay

## 2022-06-12 ENCOUNTER — Ambulatory Visit
Admission: RE | Admit: 2022-06-12 | Discharge: 2022-06-12 | Disposition: A | Discharge status: Home / Self Care | Payer: Non-veteran care | Source: Ambulatory Visit | Attending: Urology | Admitting: Urology

## 2022-06-12 ENCOUNTER — Encounter (HOSPITAL_COMMUNITY)
Admission: RE | Admit: 2022-06-12 | Discharge: 2022-06-12 | Disposition: A | Discharge status: Home / Self Care | Payer: No Typology Code available for payment source | Source: Ambulatory Visit | Attending: Urology | Admitting: Urology

## 2022-06-12 ENCOUNTER — Ambulatory Visit
Admission: RE | Admit: 2022-06-12 | Discharge: 2022-06-12 | Disposition: A | Discharge status: Home / Self Care | Payer: No Typology Code available for payment source | Source: Ambulatory Visit | Attending: Radiation Oncology | Admitting: Radiation Oncology

## 2022-06-12 ENCOUNTER — Encounter: Payer: Self-pay | Admitting: Urology

## 2022-06-12 VITALS — Resp 19 | Ht 73.0 in | Wt 275.0 lb

## 2022-06-12 DIAGNOSIS — Z01810 Encounter for preprocedural cardiovascular examination: Secondary | ICD-10-CM | POA: Insufficient documentation

## 2022-06-12 DIAGNOSIS — C61 Malignant neoplasm of prostate: Secondary | ICD-10-CM

## 2022-06-12 DIAGNOSIS — I10 Essential (primary) hypertension: Secondary | ICD-10-CM | POA: Insufficient documentation

## 2022-06-12 NOTE — Progress Notes (Signed)
Radiation Oncology         [phone]) [phone] ________________________________  Outpatient Follow up- Pre-seed visit  Name: Mark Anthony MRN: [id number]  Date: 06/12/2022  DOB: 1976-08-15  CC:Pcp, No  Collier Bullock, MD   REFERRING PHYSICIAN: Collier Bullock, MD  DIAGNOSIS: 46 y.o. gentleman  with locally advanced prostate cancer with Gleason score of 4+5, and PSA of 16.4.    ICD-10-CM   1. Prostate cancer ([place]); high risk; PSA 8.27; Gleason grade group 2,3,5  C61       HISTORY OF PRESENT ILLNESS: Mark Anthony is a 46 y.o. male with a diagnosis of prostate cancer.  He was noted to have an elevated PSA of 8.27 by his primary care physician with the [facility]. Prior PSA in 01/2020 was 3.03. Accordingly, he was referred for evaluation in urology by Hanley Hays, FNP on 11/07/21,  digital rectal examination was performed at that time revealing no nodules in the palpable area. He underwent prostate MRI on 12/20/21 showing a 15 mm PI-RADS 5 lesion in the left posterior medial and lateral peripheral zone at mid gland to apex without frank extraglandular extension or adenopathy noted.   An MRI fusion biopsy of the prostate was performed on 01/28/22.  The prostate volume measured 36 cc.  Out of 15 core biopsies, 12 were positive.  The maximum Gleason score was 4+5, and this was seen in one right base sample and one right mid sample (with perineural invasion). Additionally, Gleason 4+3 was seen in both left base cores (with PNI) and one left mid core and Gleason 3+4 in both ROI samples, all 3 left apex samples, and both right apex cores. Repeat PSA obtained on 03/06/22 showed a significant jump to 16.4.   He underwent staging PSMA PET scan on 03/18/22 showing marked focal uptake in the left posteroinferior prostate as well as marked uptake within enlarged obturator and left external iliac lymph nodes but no osseous lesions with worrisome uptake.  He met with Dr. Merrie Roof in medical oncology at the [facility] and the  recommendations are to proceed with ADT and androgen blockade, concurrent with radiation or prostatectomy.  Initially, he was most interested in surgical management but after giving further consideration, he has decided that he would prefer to proceed with definitive radiation treatment.  He met with Dr. Ali Lowe in radiation oncology who agreed with this plan but the patient prefered to have his radiation treatments closer to home.  The patient reviewed the biopsy results with his urologist and was kindly referred to [place] for discussion of potential radiation treatment options locally. We initially met the patient on 04/08/22 and he was most interested in proceeding with LT-ADT, concurrent with 5 weeks of external radiation with an upfront brachytherapy boost  for treatment of his disease. He met with Dr. Felipa Eth on 04/17/22 who was in agreement with the proposed treatment plan. He did receive a 6 month Eligard injection at that time which he is tolerating fairly well despite significant hot flashes and some mild fatigue. He was also started on abiraterone on 05/22/22, at the direction of his medical oncologist at the [facility]. He is here today for his pre-procedure imaging for brachytherapy boost planning and to answer any additional questions he may have about this treatment.   PREVIOUS RADIATION THERAPY: No  PAST MEDICAL HISTORY:  Past Medical History:  Diagnosis Date   Diabetes mellitus without complication ([facility])    Hypertension    Reflux    TIA (transient ischemic attack)  no meds following      PAST SURGICAL HISTORY: Past Surgical History:  Procedure Laterality Date   ACHILLES TENDON SURGERY Right 02/02/2020   Procedure: Right Achilles tendon debridement and reconstruction;  Surgeon: Wylene Simmer, MD;  Location: [place];  Service: Orthopedics;  Laterality: Right;   ACHILLES TENDON SURGERY Right 12/13/2020   Procedure: Right Achilles Tendon Reconstruction;  Surgeon: Wylene Simmer, MD;  Location: [place];  Service: Orthopedics;  Laterality: Right;   ELBOW FRACTURE SURGERY     GASTRIC FUNDOPLICATION     GASTROC RECESSION EXTREMITY Right 12/13/2020   Procedure: Gastroc Recession;  Surgeon: Wylene Simmer, MD;  Location: [place];  Service: Orthopedics;  Laterality: Right;    FAMILY HISTORY:  Family History  Problem Relation Age of Onset   Prostate cancer Father     SOCIAL HISTORY:  Social History   Socioeconomic History   Marital status: Single    Spouse name: Not on file   Number of children: Not on file   Years of education: Not on file   Highest education level: Not on file  Occupational History   Not on file  Tobacco Use   Smoking status: Former    Packs/day: 0.25    Types: Cigarettes, Cigars   Smokeless tobacco: Never  Vaping Use   Vaping Use: Never used  Substance and Sexual Activity   Alcohol use: Yes    Comment: occasional   Drug use: No   Sexual activity: Not on file  Other Topics Concern   Not on file  Social History Narrative   Not on file   Social Determinants of Health   Financial Resource Strain: Not on file  Food Insecurity: Not on file  Transportation Needs: Not on file  Physical Activity: Not on file  Stress: Not on file  Social Connections: Not on file  Intimate Partner Violence: Not on file    ALLERGIES: Contrast media [iodinated contrast media], Acetaminophen, and Vicodin [hydrocodone-acetaminophen]  MEDICATIONS:  Current Outpatient Medications  Medication Sig Dispense Refill   acetaminophen (TYLENOL) 325 MG tablet Take 2 tablets (650 mg total) by mouth every 6 (six) hours as needed for mild pain or headache (or Fever >/= 101). 12 tablet 2   amLODipine (NORVASC) 10 MG tablet Take 1 tablet (10 mg total) by mouth daily. 30 tablet 11   atorvastatin (LIPITOR) 10 MG tablet TAKE ONE TABLET BY MOUTH AT BEDTIME FOR CHOLESTEROL     atorvastatin (LIPITOR) 40 MG tablet Take 1 tablet (40 mg  total) by mouth daily. 30 tablet 3   cetirizine (ZYRTEC) 10 MG tablet TAKE ONE TABLET BY MOUTH DAILY AS NEEDED FOR ALLERGIES     meloxicam (MOBIC) 7.5 MG tablet TAKE ONE TABLET BY MOUTH TWICE A DAY AS NEEDED FOR SEVERE PAIN     methocarbamol (ROBAXIN) 500 MG tablet methocarbamol 500 mg tablet  Take 1 tablet every 8 hours by oral route as needed for spasm for 10 days.     nicotine (NICODERM CQ - DOSED IN MG/24 HR) 7 mg/24hr patch APPLY 1 PATCH TO SKIN ONCE A DAY FOR NICOTINE REPLACEMENT THERAPY *APPLY TO NON-HAIRY, CLEAN, DRY AREA (NO TOBACCO PRODUCTS)* (Patient not taking: Reported on 04/17/2022)     No current facility-administered medications for this encounter.    REVIEW OF SYSTEMS:  On review of systems, the patient reports that he is doing well overall. He denies any chest pain, shortness of breath, cough, fevers, chills, night sweats, unintended  weight changes. He denies any bowel disturbances, and denies abdominal pain, nausea or vomiting. He denies any new musculoskeletal or joint aches or pains. His IPSS was 2, indicating mild urinary symptoms. His SHIM was 24, indicating he does not have erectile dysfunction. A complete review of systems is obtained and is otherwise negative.    PHYSICAL EXAM:  Wt Readings from Last 3 Encounters:  06/12/22 275 lb (124.7 kg)  12/13/20 287 lb 7.7 oz (130.4 kg)  02/02/20 282 lb 6.6 oz (128.1 kg)   Temp Readings from Last 3 Encounters:  05/24/21 98 F (36.7 C) (Oral)  12/13/20 98.2 F (36.8 C)  02/02/20 98 F (36.7 C)   BP Readings from Last 3 Encounters:  05/19/22 139/84  04/17/22 (!) 157/87  03/19/22 (!) 145/85   Pulse Readings from Last 3 Encounters:  05/19/22 (!) 101  04/17/22 94  03/19/22 (!) 101   Pain Assessment Pain Score: 0-No pain/10  In general this is a well appearing African American male in no acute distress. He's alert and oriented x4 and appropriate throughout the examination. Cardiopulmonary assessment is negative for  acute distress, and he exhibits normal effort.     KPS = 100  100 - Normal; no complaints; no evidence of disease. 90   - Able to carry on normal activity; minor signs or symptoms of disease. 80   - Normal activity with effort; some signs or symptoms of disease. 74   - Cares for self; unable to carry on normal activity or to do active work. 60   - Requires occasional assistance, but is able to care for most of his personal needs. 50   - Requires considerable assistance and frequent medical care. 79   - Disabled; requires special care and assistance. 80   - Severely disabled; hospital admission is indicated although death not imminent. 90   - Very sick; hospital admission necessary; active supportive treatment necessary. 10   - Moribund; fatal processes progressing rapidly. 0     - Dead  Karnofsky DA, Abelmann [place], Craver LS and Burchenal [facility] [phone]) The use of the nitrogen mustards in the palliative treatment of carcinoma: with particular reference to bronchogenic carcinoma Cancer 1 634-56  LABORATORY DATA:  Lab Results  Component Value Date   WBC 15.5 (H) 05/24/2021   HGB 15.6 05/24/2021   HCT 46.0 05/24/2021   MCV 91.6 05/24/2021   PLT 358 05/24/2021   Lab Results  Component Value Date   NA 142 05/24/2021   K 3.5 05/24/2021   CL 101 05/24/2021   CO2 29 05/24/2021   Lab Results  Component Value Date   ALT 33 05/24/2021   AST 24 05/24/2021   ALKPHOS 77 05/24/2021   BILITOT 0.4 05/24/2021     RADIOGRAPHY: No results found.    IMPRESSION/PLAN: 1. 46 y.o. gentleman  with locally advanced prostate cancer with Gleason score of 4+5, and PSA of 16.4.  The patient has elected to proceed with LT-ADT concurrent with 5 weeks of external radiation with an upfront brachytherapy boost for treatment of his disease and is here today for pre-seed boost planning. We reviewed the risks, benefits, short and [place]-term effects associated with brachytherapy and discussed the role of SpaceOAR in  reducing the rectal toxicity associated with radiotherapy.  He appears to have a good understanding of his disease and our treatment recommendations which are of curative intent.  He was encouraged to ask questions that were answered to his stated satisfaction. He has freely signed  written consent to proceed today in the office and a copy of this document will be placed in his medical record. His procedure is tentatively scheduled for 07/24/22 in collaboration with Dr. Alyson Ingles and we will see him back for his post-procedure visit approximately 3 weeks thereafter. We look forward to continuing to participate in his care. He knows that he is welcome to call with any questions or concerns at any time in the interim.  I personally spent 40 minutes in this encounter including chart review, reviewing radiological studies, meeting face-to-face with the patient, entering orders and completing documentation.    Nicholos Johns, MMS, PA-C [place] at [place]: [phone]  Fax: [phone]

## 2022-06-12 NOTE — Progress Notes (Signed)
Pre-seed appointment. I verified patient's identity and began nursing interview. Patient reports occasional urinary urgency w/ hydration but is having no issues w/ it. No other issues reported at this time.  Meaningful use complete. No urinary management medications. Urology appt- Jul 24, 2022  Resp 19   Ht '6\' 1"'$  (1.854 m)   Wt 275 lb (124.7 kg)   BMI 36.28 kg/m

## 2022-06-27 ENCOUNTER — Ambulatory Visit: Payer: No Typology Code available for payment source | Attending: Family | Admitting: Internal Medicine

## 2022-06-27 DIAGNOSIS — G4730 Sleep apnea, unspecified: Secondary | ICD-10-CM | POA: Insufficient documentation

## 2022-06-27 DIAGNOSIS — G4733 Obstructive sleep apnea (adult) (pediatric): Secondary | ICD-10-CM

## 2022-07-07 ENCOUNTER — Encounter (HOSPITAL_BASED_OUTPATIENT_CLINIC_OR_DEPARTMENT_OTHER): Payer: Self-pay | Admitting: Urology

## 2022-07-07 ENCOUNTER — Other Ambulatory Visit: Payer: Self-pay

## 2022-07-07 NOTE — Progress Notes (Signed)
Spoke w/ via phone for pre-op interview---pt Lab needs dos----    I stat           Lab results------ekg 06-12-2022 chart/epic COVID test -----patient states asymptomatic no test needed Arrive at -------1130 am 07-24-2022 NPO after MN NO Solid Food.  Clear liquids from MN until---1030 am Med rec completed Medications to take morning of surgery -----amlodipine, prednisone, abiraterone acetate Diabetic medication -----n/a Patient instructed no nail polish to be worn day of surgery Patient instructed to bring photo id and insurance card day of surgery Patient aware to have Driver (ride ) / caregiver  [name]   for 24 hours after surgery  Patient Special Instructions -----none Pre-Op special Istructions -----fleets enema am of surgery Patient verbalized understanding of instructions that were given at this phone interview. Patient denies shortness of breath, chest pain, fever, cough at this phone interview.

## 2022-07-16 ENCOUNTER — Encounter: Payer: Non-veteran care | Admitting: Neurology

## 2022-07-22 ENCOUNTER — Encounter: Payer: Non-veteran care | Admitting: Neurology

## 2022-07-23 ENCOUNTER — Telehealth: Payer: Self-pay | Admitting: *Deleted

## 2022-07-23 NOTE — Telephone Encounter (Signed)
CALLED PATIENT TO REMIND OF PROCEDURE FOR 07-24-22, LVM FOR A RETURN CALL

## 2022-07-23 NOTE — Telephone Encounter (Signed)
RETURNED PATIENT'S PHONE , SPOKE WITH PATIENT ?

## 2022-07-24 ENCOUNTER — Ambulatory Visit (HOSPITAL_BASED_OUTPATIENT_CLINIC_OR_DEPARTMENT_OTHER): Payer: No Typology Code available for payment source | Admitting: Certified Registered Nurse Anesthetist

## 2022-07-24 ENCOUNTER — Encounter (HOSPITAL_BASED_OUTPATIENT_CLINIC_OR_DEPARTMENT_OTHER)
Admission: RE | Disposition: A | Discharge status: Home / Self Care | Payer: Self-pay | Source: Ambulatory Visit | Attending: Urology

## 2022-07-24 ENCOUNTER — Encounter (HOSPITAL_BASED_OUTPATIENT_CLINIC_OR_DEPARTMENT_OTHER): Payer: Self-pay | Admitting: Urology

## 2022-07-24 ENCOUNTER — Other Ambulatory Visit: Payer: Self-pay

## 2022-07-24 ENCOUNTER — Ambulatory Visit (HOSPITAL_BASED_OUTPATIENT_CLINIC_OR_DEPARTMENT_OTHER)
Admission: RE | Admit: 2022-07-24 | Discharge: 2022-07-24 | Disposition: A | Discharge status: Home / Self Care | Payer: No Typology Code available for payment source | Source: Ambulatory Visit | Attending: Urology | Admitting: Urology

## 2022-07-24 ENCOUNTER — Other Ambulatory Visit: Payer: Self-pay | Admitting: Urology

## 2022-07-24 ENCOUNTER — Ambulatory Visit (HOSPITAL_COMMUNITY): Payer: No Typology Code available for payment source

## 2022-07-24 DIAGNOSIS — E119 Type 2 diabetes mellitus without complications: Secondary | ICD-10-CM | POA: Diagnosis not present

## 2022-07-24 DIAGNOSIS — I10 Essential (primary) hypertension: Secondary | ICD-10-CM | POA: Diagnosis not present

## 2022-07-24 DIAGNOSIS — C61 Malignant neoplasm of prostate: Secondary | ICD-10-CM

## 2022-07-24 DIAGNOSIS — Z8673 Personal history of transient ischemic attack (TIA), and cerebral infarction without residual deficits: Secondary | ICD-10-CM | POA: Insufficient documentation

## 2022-07-24 DIAGNOSIS — Z01818 Encounter for other preprocedural examination: Secondary | ICD-10-CM

## 2022-07-24 DIAGNOSIS — Z87891 Personal history of nicotine dependence: Secondary | ICD-10-CM

## 2022-07-24 HISTORY — PX: RADIOACTIVE SEED IMPLANT: SHX5150

## 2022-07-24 HISTORY — DX: Sleep apnea, unspecified: G47.30

## 2022-07-24 HISTORY — DX: Anxiety disorder, unspecified: F41.9

## 2022-07-24 HISTORY — DX: Presence of spectacles and contact lenses: Z97.3

## 2022-07-24 HISTORY — PX: CYSTOSCOPY: SHX5120

## 2022-07-24 HISTORY — PX: SPACE OAR INSTILLATION: SHX6769

## 2022-07-24 HISTORY — DX: Malignant neoplasm of prostate: C61

## 2022-07-24 HISTORY — DX: Other chronic pain: G89.29

## 2022-07-24 HISTORY — DX: Unspecified osteoarthritis, unspecified site: M19.90

## 2022-07-24 LAB — POCT I-STAT, CHEM 8
BUN: 10 mg/dL (ref 6–20)
Calcium, Ion: 1.23 mmol/L (ref 1.15–1.40)
Chloride: 102 mmol/L (ref 98–111)
Creatinine, Ser: 0.8 mg/dL (ref 0.61–1.24)
Glucose, Bld: 124 mg/dL — ABNORMAL HIGH (ref 70–99)
HCT: 50 % (ref 39.0–52.0)
Hemoglobin: 17 g/dL (ref 13.0–17.0)
Potassium: 3.9 mmol/L (ref 3.5–5.1)
Sodium: 141 mmol/L (ref 135–145)
TCO2: 26 mmol/L (ref 22–32)

## 2022-07-24 SURGERY — INSERTION, RADIATION SOURCE, PROSTATE
Anesthesia: General | Site: Urethra

## 2022-07-24 MED ORDER — MIDAZOLAM HCL 2 MG/2ML IJ SOLN
INTRAMUSCULAR | Status: DC | PRN
  Administered 2022-07-24: 2 mg via INTRAVENOUS

## 2022-07-24 MED ORDER — IOHEXOL 300 MG/ML  SOLN
INTRAMUSCULAR | Status: DC | PRN
  Administered 2022-07-24: 7 mL

## 2022-07-24 MED ORDER — ACETAMINOPHEN 325 MG PO TABS: 325.0000 mg | ORAL_TABLET | ORAL | Status: DC | PRN

## 2022-07-24 MED ORDER — LACTATED RINGERS IV SOLN: INTRAVENOUS | Status: DC

## 2022-07-24 MED ORDER — FLEET ENEMA 7-19 GM/118ML RE ENEM: 1.0000 | ENEMA | Freq: Once | RECTAL | Status: DC

## 2022-07-24 MED ORDER — SODIUM CHLORIDE (PF) 0.9 % IJ SOLN
INTRAMUSCULAR | Status: DC | PRN
  Administered 2022-07-24: 10 mL

## 2022-07-24 MED ORDER — CEFAZOLIN SODIUM-DEXTROSE 2-4 GM/100ML-% IV SOLN
2.0000 g | Freq: Once | INTRAVENOUS | Status: AC
  Administered 2022-07-24: 2 g via INTRAVENOUS

## 2022-07-24 MED ORDER — SODIUM CHLORIDE 0.9 % IV SOLN
INTRAVENOUS | Status: AC | PRN
  Administered 2022-07-24: 1000 mL

## 2022-07-24 MED ORDER — PROPOFOL 10 MG/ML IV BOLUS
INTRAVENOUS | Status: AC
  Filled 2022-07-24: qty 20

## 2022-07-24 MED ORDER — ONDANSETRON HCL 4 MG/2ML IJ SOLN
INTRAMUSCULAR | Status: DC | PRN
  Administered 2022-07-24: 4 mg via INTRAVENOUS

## 2022-07-24 MED ORDER — PROMETHAZINE HCL 25 MG/ML IJ SOLN: 6.2500 mg | INTRAMUSCULAR | Status: DC | PRN

## 2022-07-24 MED ORDER — LIDOCAINE 2% (20 MG/ML) 5 ML SYRINGE
INTRAMUSCULAR | Status: DC | PRN
  Administered 2022-07-24: 60 mg via INTRAVENOUS

## 2022-07-24 MED ORDER — PROPOFOL 10 MG/ML IV BOLUS
INTRAVENOUS | Status: DC | PRN
  Administered 2022-07-24: 200 mg via INTRAVENOUS

## 2022-07-24 MED ORDER — FENTANYL CITRATE (PF) 100 MCG/2ML IJ SOLN
INTRAMUSCULAR | Status: AC
  Filled 2022-07-24: qty 2

## 2022-07-24 MED ORDER — CEFAZOLIN SODIUM-DEXTROSE 2-4 GM/100ML-% IV SOLN
INTRAVENOUS | Status: AC
  Filled 2022-07-24: qty 100

## 2022-07-24 MED ORDER — STERILE WATER FOR IRRIGATION IR SOLN
Status: DC | PRN
  Administered 2022-07-24: 500 mL

## 2022-07-24 MED ORDER — FENTANYL CITRATE (PF) 100 MCG/2ML IJ SOLN: 25.0000 ug | INTRAMUSCULAR | Status: DC | PRN

## 2022-07-24 MED ORDER — FENTANYL CITRATE (PF) 250 MCG/5ML IJ SOLN
INTRAMUSCULAR | Status: DC | PRN
  Administered 2022-07-24: 25 ug via INTRAVENOUS
  Administered 2022-07-24: 50 ug via INTRAVENOUS
  Administered 2022-07-24 (×3): 25 ug via INTRAVENOUS

## 2022-07-24 MED ORDER — OXYCODONE HCL 5 MG/5ML PO SOLN: 5.0000 mg | Freq: Once | ORAL | Status: DC | PRN

## 2022-07-24 MED ORDER — AMISULPRIDE (ANTIEMETIC) 5 MG/2ML IV SOLN: 10.0000 mg | Freq: Once | INTRAVENOUS | Status: DC | PRN

## 2022-07-24 MED ORDER — ACETAMINOPHEN 160 MG/5ML PO SOLN: 325.0000 mg | ORAL | Status: DC | PRN

## 2022-07-24 MED ORDER — PROPOFOL 500 MG/50ML IV EMUL
INTRAVENOUS | Status: DC | PRN
  Administered 2022-07-24: 50 ug/kg/min via INTRAVENOUS

## 2022-07-24 MED ORDER — MIDAZOLAM HCL 2 MG/2ML IJ SOLN
INTRAMUSCULAR | Status: AC
  Filled 2022-07-24: qty 2

## 2022-07-24 MED ORDER — DEXAMETHASONE SODIUM PHOSPHATE 10 MG/ML IJ SOLN
INTRAMUSCULAR | Status: DC | PRN
  Administered 2022-07-24: 10 mg via INTRAVENOUS

## 2022-07-24 MED ORDER — OXYCODONE HCL 5 MG PO TABS: 5.0000 mg | ORAL_TABLET | Freq: Once | ORAL | Status: DC | PRN

## 2022-07-24 MED ORDER — TRAMADOL HCL 50 MG PO TABS: 50.0000 mg | ORAL_TABLET | Freq: Four times a day (QID) | ORAL | 0 refills | Status: AC | PRN

## 2022-07-24 MED ORDER — ACETAMINOPHEN 10 MG/ML IV SOLN: 1000.0000 mg | Freq: Once | INTRAVENOUS | Status: DC | PRN

## 2022-07-24 SURGICAL SUPPLY — 38 items
BAG DRN RND TRDRP ANRFLXCHMBR (UROLOGICAL SUPPLIES) ×3
BAG URINE DRAIN 2000ML AR STRL (UROLOGICAL SUPPLIES) ×4 IMPLANT
BLADE CLIPPER SENSICLIP SURGIC (BLADE) ×4 IMPLANT
CATH FOLEY 2WAY SLVR  5CC 16FR (CATHETERS) ×6
CATH FOLEY 2WAY SLVR 5CC 16FR (CATHETERS) ×8 IMPLANT
CATH ROBINSON RED A/P 20FR (CATHETERS) ×4 IMPLANT
CLOTH BEACON ORANGE TIMEOUT ST (SAFETY) ×4 IMPLANT
COVER BACK TABLE 60X90IN (DRAPES) ×4 IMPLANT
COVER MAYO STAND STRL (DRAPES) ×4 IMPLANT
DRSG TEGADERM 4X4.75 (GAUZE/BANDAGES/DRESSINGS) ×4 IMPLANT
DRSG TEGADERM 8X12 (GAUZE/BANDAGES/DRESSINGS) ×4 IMPLANT
GAUZE SPONGE 4X4 12PLY STRL (GAUZE/BANDAGES/DRESSINGS) IMPLANT
GLOVE BIO SURGEON STRL SZ 6.5 (GLOVE) ×4 IMPLANT
GLOVE BIO SURGEON STRL SZ8 (GLOVE) ×4 IMPLANT
GLOVE BIOGEL PI IND STRL 7.0 (GLOVE) IMPLANT
GLOVE SURG ORTHO 8.5 STRL (GLOVE) ×4 IMPLANT
GLOVE SURG SS PI 6.5 STRL IVOR (GLOVE) IMPLANT
GOWN STRL REUS W/TWL LRG LVL3 (GOWN DISPOSABLE) IMPLANT
GOWN STRL REUS W/TWL XL LVL3 (GOWN DISPOSABLE) ×4 IMPLANT
GRID BRACH TEMP 18GA 2.8X3X.75 (MISCELLANEOUS) ×4 IMPLANT
HOLDER FOLEY CATH W/STRAP (MISCELLANEOUS) ×4 IMPLANT
I125 Seeds IMPLANT
IMPL SPACEOAR SYSTEM 10ML (Spacer) IMPLANT
IMPLANT SPACEOAR SYSTEM 10ML (Spacer) ×3 IMPLANT
IV NS 1000ML (IV SOLUTION) ×3
IV NS 1000ML BAXH (IV SOLUTION) ×4 IMPLANT
KIT TURNOVER CYSTO (KITS) ×4 IMPLANT
NDL BRACHY 18G 5PK (NEEDLE) ×16 IMPLANT
NDL PK MORGANSTERN STABILIZ (NEEDLE) ×4 IMPLANT
NEEDLE BRACHY 18G 5PK (NEEDLE) ×12 IMPLANT
NEEDLE PK MORGANSTERN STABILIZ (NEEDLE) ×3 IMPLANT
PACK CYSTO (CUSTOM PROCEDURE TRAY) ×4 IMPLANT
SHEATH ULTRASOUND LTX NONSTRL (SHEATH) IMPLANT
SYR 10ML LL (SYRINGE) ×8 IMPLANT
SYR CONTROL 10ML LL (SYRINGE) ×4 IMPLANT
TOWEL OR 17X26 10 PK STRL BLUE (TOWEL DISPOSABLE) ×8 IMPLANT
UNDERPAD 30X36 HEAVY ABSORB (UNDERPADS AND DIAPERS) ×8 IMPLANT
WATER STERILE IRR 500ML POUR (IV SOLUTION) ×4 IMPLANT

## 2022-07-24 NOTE — H&P (Signed)
Urology Admission H&P  Chief Complaint: prostate cancer  History of Present Illness: Mr Mark Anthony is a 45yo here for brachytherapy with SpaceOAR for prostate cancer. He denies any significant LUTS.  Past Medical History:  Diagnosis Date   Anxiety    Arthritis    knees, gets right knee injection q 4 months   Chronic low back pain with left-sided sciatica    Diabetes mellitus without complication (HCC)    diet controlled does not check cbg at home, last hemaglobin a1c was 7  in june 2023 per pt   Hypertension    Prostate cancer [facility])    dx june 2023   Reflux    Sleep apnea    osa sleep study done  06-27-2022 no cpap yet per pt on 07-07-2022   TIA (transient ischemic attack)    1 and 1/2 yrs ago per pt on 07-07-2022  sees pcp dr  s Duke Salvia at [facility]   Wears glasses    Past Surgical History:  Procedure Laterality Date   ACHILLES TENDON SURGERY Right 02/02/2020   Procedure: Right Achilles tendon debridement and reconstruction;  Surgeon: Wylene Simmer, MD;  Location: [place];  Service: Orthopedics;  Laterality: Right;   ACHILLES TENDON SURGERY Right 12/13/2020   Procedure: Right Achilles Tendon Reconstruction;  Surgeon: Wylene Simmer, MD;  Location: [place];  Service: Orthopedics;  Laterality: Right;   ELBOW FRACTURE SURGERY Left    yrs ago   GASTRIC FUNDOPLICATION     yrs ago   GASTROC RECESSION EXTREMITY Right 12/13/2020   Procedure: Gastroc Recession;  Surgeon: Wylene Simmer, MD;  Location: [place];  Service: Orthopedics;  Laterality: Right;   right thumb surgery     yrs ago    Home Medications:  Current Facility-Administered Medications  Medication Dose Route Frequency Provider Last Rate Last Admin   ceFAZolin (ANCEF) IVPB 2g/100 mL premix  2 g Intravenous Once Goodwin Kamphaus, Candee Furbish, MD       lactated ringers infusion   Intravenous Continuous Ellender, Karyl Kinnier, MD 50 mL/hr at 07/24/22 1232 New Bag at 07/24/22 1232   [START ON  07/25/2022] sodium phosphate (FLEET) 7-19 GM/118ML enema 1 enema  1 enema Rectal Once Alyson Ingles Candee Furbish, MD       Allergies:  Allergies  Allergen Reactions   Contrast Media [Iodinated Contrast Media] Nausea And Vomiting and Swelling    Pt. Vomited immediately after IV dye injection.  Pt. Then had throat swelling and edema to uvula.     Acetaminophen     Other reaction(s): heart racing   Vicodin [Hydrocodone-Acetaminophen] Nausea And Vomiting    Family History  Problem Relation Age of Onset   Prostate cancer Father    Social History:  reports that he quit smoking about 2 years ago. His smoking use included cigarettes and cigars. He has a 26.00 pack-year smoking history. He has never used smokeless tobacco. He reports that he does not currently use alcohol. He reports that he does not use drugs.  Review of Systems  All other systems reviewed and are negative.   Physical Exam:  Vital signs in last 24 hours: Temp:  [98.7 F (37.1 C)] 98.7 F (37.1 C) (09/28 1126) Pulse Rate:  [88] 88 (09/28 1126) Resp:  [18] 18 (09/28 1126) BP: (152)/(94) 152/94 (09/28 1126) SpO2:  [97 %] 97 % (09/28 1126) Weight:  [123.6 kg] 123.6 kg (09/28 1126) Physical Exam Constitutional:      Appearance: Normal appearance.  HENT:     Head: Normocephalic and atraumatic.     Nose: Nose normal. No congestion.  Eyes:     Extraocular Movements: Extraocular movements intact.     Pupils: Pupils are equal, round, and reactive to light.  Cardiovascular:     Rate and Rhythm: Normal rate and regular rhythm.  Pulmonary:     Effort: Pulmonary effort is normal. No respiratory distress.  Abdominal:     General: Abdomen is flat. There is no distension.  Musculoskeletal:        General: No swelling. Normal range of motion.     Cervical back: Normal range of motion and neck supple.  Skin:    General: Skin is warm and dry.  Neurological:     General: No focal deficit present.     Mental Status: He is alert and  oriented to person, place, and time.  Psychiatric:        Mood and Affect: Mood normal.        Behavior: Behavior normal.        Thought Content: Thought content normal.        Judgment: Judgment normal.     Laboratory Data:  Results for orders placed or performed during the hospital encounter of 07/24/22 (from the past 24 hour(s))  I-STAT, chem 8     Status: Abnormal   Collection Time: 07/24/22 12:29 PM  Result Value Ref Range   Sodium 141 135 - 145 mmol/L   Potassium 3.9 3.5 - 5.1 mmol/L   Chloride 102 98 - 111 mmol/L   BUN 10 6 - 20 mg/dL   Creatinine, Ser 0.80 0.61 - 1.24 mg/dL   Glucose, Bld 124 (H) 70 - 99 mg/dL   Calcium, Ion 1.23 1.15 - 1.40 mmol/L   TCO2 26 22 - 32 mmol/L   Hemoglobin 17.0 13.0 - 17.0 g/dL   HCT 50.0 39.0 - 52.0 %   No results found for this or any previous visit (from the past 240 hour(s)). Creatinine: Recent Labs    07/24/22 1229  CREATININE 0.80   Baseline Creatinine: 0.8  Impression/Assessment:  46yo with prostate cancer  Plan:  I discussed the natural history of high risk prostate cancer with the patient and the various treatment options including active surveillance, RALP, IMRT, brachytherapy, cryotherapy, HIFU and ADT. After discussing the options the patient elects for brachytherapy with SpaceOAR followed by IMRT. Risks/benefits/alternatives discussed   Nicolette Bang 07/24/2022, 1:14 PM

## 2022-07-24 NOTE — Discharge Instructions (Signed)

## 2022-07-24 NOTE — Anesthesia Preprocedure Evaluation (Signed)
Anesthesia Evaluation    Reviewed: Allergy & Precautions, Patient's Chart, lab work & pertinent test results  Airway Mallampati: III       Dental  (+) Teeth Intact, Dental Advisory Given   Pulmonary sleep apnea , Patient abstained from smoking., former smoker,    breath sounds clear to auscultation       Cardiovascular hypertension, Pt. on medications  Rhythm:Regular Rate:Normal     Neuro/Psych Anxiety TIA   GI/Hepatic negative GI ROS, Neg liver ROS,   Endo/Other  diabetes  Renal/GU negative Renal ROS     Musculoskeletal  (+) Arthritis ,   Abdominal (+) + obese,   Peds  Hematology   Anesthesia Other Findings   Reproductive/Obstetrics                             Anesthesia Physical Anesthesia Plan  ASA: 3  Anesthesia Plan: General   Post-op Pain Management:    Induction: Intravenous  PONV Risk Score and Plan: 3 and Ondansetron, Dexamethasone and Midazolam  Airway Management Planned: LMA  Additional Equipment: None  Intra-op Plan:   Post-operative Plan: Extubation in OR  Informed Consent: I have reviewed the patients History and Physical, chart, labs and discussed the procedure including the risks, benefits and alternatives for the proposed anesthesia with the patient or authorized representative who has indicated his/her understanding and acceptance.     Dental advisory given  Plan Discussed with: CRNA  Anesthesia Plan Comments:         Anesthesia Quick Evaluation

## 2022-07-24 NOTE — Anesthesia Postprocedure Evaluation (Signed)
Anesthesia Post Note  Patient: Mark Anthony  Procedure(s) Performed: RADIOACTIVE SEED IMPLANT/BRACHYTHERAPY IMPLANT (Prostate) SPACE OAR INSTILLATION (Perineum) CYSTOSCOPY (Urethra)     Patient location during evaluation: PACU Anesthesia Type: General Level of consciousness: awake and alert and oriented Pain management: pain level controlled Vital Signs Assessment: post-procedure vital signs reviewed and stable Respiratory status: spontaneous breathing, nonlabored ventilation and respiratory function stable Cardiovascular status: blood pressure returned to baseline and stable Postop Assessment: no apparent nausea or vomiting Anesthetic complications: no   No notable events documented.  Last Vitals:  Vitals:   07/24/22 1455 07/24/22 1515  BP:  (!) 179/100  Pulse:  74  Resp:  16  Temp:    SpO2: 97% 100%    Last Pain:  Vitals:   07/24/22 1515  TempSrc:   PainSc: 0-No pain                 Ramsha Lonigro A.

## 2022-07-24 NOTE — Op Note (Signed)
PRE-OPERATIVE DIAGNOSIS:  Adenocarcinoma of the prostate  POST-OPERATIVE DIAGNOSIS:  Same  PROCEDURE:  Procedure(s): 1. I-125 radioactive seed implantation 2. Cystoscopy 3. SpaceOAR placement  SURGEON:  Surgeon(s): Nicolette Bang, MD  Radiation oncologist: Tyler Pita, MD  ANESTHESIA:  General  EBL:  Minimal  DRAINS: 21 French Foley catheter  INDICATION: Mark Anthony is a 46 year old with a history of high risk prostate cancer. After discussing treatment options he has elected to proceed with brachytherapy and SpaceOAR  Description of procedure: After informed consent the patient was brought to the major OR, placed on the table and administered general anesthesia. He was then moved to the modified lithotomy position with his perineum perpendicular to the floor. His perineum and genitalia were then sterilely prepped. An official timeout was then performed. A 16 French Foley catheter was then placed in the bladder and filled with dilute contrast, a rectal tube was placed in the rectum and the transrectal ultrasound probe was placed in the rectum and affixed to the stand. He was then sterilely draped.  Real time ultrasonography was used along with the seed planning software [employer] Prostate vs. 4.2.21. This was used to develop the seed plan including the number of needles as well as number of seeds required for complete and adequate coverage. Real-time ultrasonography was then used along with the previously developed plan and the Nucletron device to implant a total of 45 seeds using 16 needles. This proceeded without difficulty or complication.  We then proceeded to mix the SpaceOAR using the kit supplied from the manufacturer. Once this was complete we placed a sinal needle into the perirectal fat between the rectum and the prostate. Once this was accomplished we injected 2cc of normal saline to hydrodissect the plain. We then instilled the the SpaceOAR through the spinal needle and noted  good distribution in the perirectal fat.    A Foley catheter was then removed as well as the transrectal ultrasound probe and rectal probe. Flexible cystoscopy was then performed using the 17 French flexible scope which revealed a normal urethra throughout its length down to the sphincter which appeared intact. The prostatic urethra revealed bilobar hypertrophy but no evidence of obstruction, seeds, spacers or lesions. The bladder was then entered and fully and systematically inspected. The ureteral orifices were noted to be of normal configuration and position. The mucosa revealed no evidence of tumors. There were also no stones identified within the bladder. I noted no seeds or spacers on the floor of the bladder and retroflexion of the scope revealed no seeds protruding from the base of the prostate.  The cystoscope was then removed and a new 66 French Foley catheter was then inserted and the balloon was filled with 10 cc of sterile water. This was connected to closed system drainage and the patient was awakened and taken to recovery room in stable and satisfactory condition. He tolerated procedure well and there were no intraoperative complications.

## 2022-07-24 NOTE — Progress Notes (Addendum)
Radiation Oncology         [phone] ________________________________  Name: Mark Anthony MRN: [id number]  Date: 07/24/22  DOB: 02/16/1976       Prostate Seed Implant  CC:Pcp, No  No ref. provider found  DIAGNOSIS: 46 y.o. gentleman with locally advanced prostate cancer with Gleason score of 4+5, and PSA of 16.4.  Oncology History  Prostate cancer [facility]); high risk; PSA 8.27; Gleason grade group 2,3,5  03/18/2022 Cancer Staging   Staging form: Prostate, AJCC 8th Edition - Clinical stage from 03/18/2022: Stage IVA (cT1c, cN1, cM0, PSA: 16.4, Grade Group: 5) - Signed by Freeman Caldron, PA-C on 04/08/2022 Histopathologic type: Adenocarcinoma, NOS Prostate specific antigen (PSA) range: 10 to 19 Gleason primary pattern: 4 Gleason secondary pattern: 5 Gleason score: 9 Histologic grading system: 5 grade system Number of biopsy cores examined: 15 Number of biopsy cores positive: 12 Location of positive needle core biopsies: Both sides   03/19/2022 Initial Diagnosis   Prostate cancer ([place]); high risk; PSA 8.27; Gleason grade group 2,3,5       ICD-10-CM   1. Pre-op testing  Z01.818 CANCELED: CBG per Guidelines for Diabetes Management for Patients Undergoing Surgery ([facility], [facility], and [facility] only)    CANCELED: CBG per protocol    CANCELED: CBG per Guidelines for Diabetes Management for Patients Undergoing Surgery ([facility], [facility], and [facility] only)    CANCELED: CBG per protocol    2. Hypertension, unspecified type  I10 CANCELED: I-Stat, Chem 8 on day of surgery per protocol    CANCELED: EKG 12 lead per protocol    CANCELED: I-Stat, Chem 8 on day of surgery per protocol      PROCEDURE: Insertion of radioactive I-125 seeds into the prostate gland.  RADIATION DOSE: 110 Gy, boost therapy.  TECHNIQUE: Mark Anthony was brought to the operating room with the urologist. He was placed in the dorsolithotomy position. He was catheterized and a rectal tube was inserted. The perineum was shaved, prepped and draped. The  ultrasound probe was then introduced into the rectum to see the prostate gland.  TREATMENT DEVICE: A needle grid was attached to the ultrasound probe stand and anchor needles were placed.  3D PLANNING: The prostate was imaged in 3D using a sagittal sweep of the prostate probe. These images were transferred to the planning computer. There, the prostate, urethra and rectum were defined on each axial reconstructed image. Then, the software created an optimized 3D plan and a few seed positions were adjusted. The quality of the plan was reviewed using [facility] information for the target and the following two organs at risk:  Urethra and Rectum.  Then the accepted plan was printed and handed off to the radiation therapist.  Under my supervision, the custom loading of the seeds and spacers was carried out and loaded into sealed vicryl sleeves.  These pre-loaded needles were then placed into the needle holder.Marland Kitchen  PROSTATE VOLUME STUDY:  Using transrectal ultrasound the volume of the prostate was verified to be 19.8 cc.  SPECIAL TREATMENT PROCEDURE/SUPERVISION AND HANDLING: The pre-loaded needles were then delivered under sagittal guidance. A total of 16 needles were used to deposit 45 seeds in the prostate gland. The individual seed activity was 0.310 mCi.  SpaceOAR:  Yes  COMPLEX SIMULATION: At the end of the procedure, an anterior radiograph of the pelvis was obtained to document seed positioning and count. Cystoscopy was performed to check the urethra and bladder.  MICRODOSIMETRY: At the end of the procedure, the patient was emitting  0.021 mR/hr at 1 meter. Accordingly, he was considered safe for hospital discharge.  PLAN: The patient will return to the radiation oncology clinic for post implant CT dosimetry in three weeks.   ________________________________  Mark Anthony, M.D.

## 2022-07-24 NOTE — Transfer of Care (Signed)
Immediate Anesthesia Transfer of Care Note  Patient: Mark Anthony  Procedure(s) Performed: RADIOACTIVE SEED IMPLANT/BRACHYTHERAPY IMPLANT (Perineum) SPACE OAR INSTILLATION (Perineum) CYSTOSCOPY (Urethra)  Patient Location: PACU  Anesthesia Type:General  Level of Consciousness: awake, alert  and oriented  Airway & Oxygen Therapy: Patient Spontanous Breathing and Patient connected to nasal cannula oxygen  Post-op Assessment: Report given to RN and Post -op Vital signs reviewed and stable  Post vital signs: Reviewed and stable  Last Vitals:  Vitals Value Taken Time  BP 171/97 07/24/22 1455  Temp    Pulse 85 07/24/22 1500  Resp 21 07/24/22 1500  SpO2 97 % 07/24/22 1500  Vitals shown include unvalidated device data.  Last Pain:  Vitals:   07/24/22 1126  TempSrc: Oral         Complications: No notable events documented.

## 2022-07-24 NOTE — Anesthesia Procedure Notes (Signed)
Procedure Name: LMA Insertion Date/Time: 07/24/2022 1:33 PM  Performed by: Clearnce Sorrel, CRNAPre-anesthesia Checklist: Patient identified, Emergency Drugs available, Suction available and Patient being monitored Patient Re-evaluated:Patient Re-evaluated prior to induction Oxygen Delivery Method: Circle System Utilized Preoxygenation: Pre-oxygenation with 100% oxygen Induction Type: IV induction Ventilation: Mask ventilation without difficulty LMA: LMA inserted LMA Size: 5.0 Number of attempts: 1 Airway Equipment and Method: Bite block Placement Confirmation: positive ETCO2 Tube secured with: Tape Dental Injury: Teeth and Oropharynx as per pre-operative assessment

## 2022-07-25 ENCOUNTER — Encounter (HOSPITAL_BASED_OUTPATIENT_CLINIC_OR_DEPARTMENT_OTHER): Payer: Self-pay | Admitting: Urology

## 2022-07-25 ENCOUNTER — Telehealth: Payer: Self-pay

## 2022-07-25 ENCOUNTER — Ambulatory Visit: Payer: No Typology Code available for payment source | Admitting: Radiation Oncology

## 2022-07-25 NOTE — Telephone Encounter (Signed)
Received phone call from patient's spouse concerning if patient can be around children and other individual after and during radiation chemo therapy. Made patient aware that patient need to be 6 feet apart from other individual. Patient should not be around small children and pregnant women for 3 months. Patient's spouse voiced understanding.

## 2022-07-28 ENCOUNTER — Ambulatory Visit: Payer: No Typology Code available for payment source | Admitting: Radiation Oncology

## 2022-07-29 ENCOUNTER — Telehealth: Payer: Self-pay

## 2022-07-29 ENCOUNTER — Ambulatory Visit (INDEPENDENT_AMBULATORY_CARE_PROVIDER_SITE_OTHER): Payer: No Typology Code available for payment source | Admitting: Urology

## 2022-07-29 DIAGNOSIS — C61 Malignant neoplasm of prostate: Secondary | ICD-10-CM | POA: Diagnosis not present

## 2022-07-29 NOTE — Telephone Encounter (Signed)
Follow up with patient after voiding trial that was done on 10/03 to ensure that  he was able to void without any difficulty. Patient voiced that he was able to void about 3 times with out any difficulty. Made patient aware to increase fluids and to follow up if he run into any difficulty. Patient voiced understanding.

## 2022-07-29 NOTE — Progress Notes (Signed)
Fill and Pull Catheter Removal  Patient is present today for a catheter removal.  Patient was cleaned and prepped in a sterile fashion 189m of sterile water/ saline was instilled into the bladder when the patient felt the urge to urinate. 11mof water was then drained from the balloon.  A 16FR foley cath was removed from the bladder no complications were noted .  Patient as then given some time to void on their own.  Patient can void  17555mn their own after some time.  Patient tolerated well.  Performed by: ShaMarisue BrooklynMA  Follow up/ Additional notes: Follow up with a PVR @ 2pm to ensure patient is voiding okay. [facility]

## 2022-07-30 ENCOUNTER — Ambulatory Visit: Payer: Non-veteran care | Admitting: Physician Assistant

## 2022-08-06 ENCOUNTER — Telehealth: Payer: Self-pay | Admitting: *Deleted

## 2022-08-06 NOTE — Telephone Encounter (Signed)
CALLED PATIENT TO REMIND OF SIM AND MRI FOR 08-07-22, LVM FOR A RETURN CALL

## 2022-08-07 ENCOUNTER — Ambulatory Visit (HOSPITAL_COMMUNITY)
Admission: RE | Admit: 2022-08-07 | Discharge: 2022-08-07 | Disposition: A | Discharge status: Home / Self Care | Payer: No Typology Code available for payment source | Source: Ambulatory Visit | Attending: Urology | Admitting: Urology

## 2022-08-07 ENCOUNTER — Ambulatory Visit
Admission: RE | Admit: 2022-08-07 | Discharge: 2022-08-07 | Disposition: A | Discharge status: Home / Self Care | Payer: No Typology Code available for payment source | Source: Ambulatory Visit | Attending: Radiation Oncology | Admitting: Radiation Oncology

## 2022-08-07 ENCOUNTER — Other Ambulatory Visit: Payer: Self-pay

## 2022-08-07 DIAGNOSIS — C61 Malignant neoplasm of prostate: Secondary | ICD-10-CM | POA: Diagnosis present

## 2022-08-07 DIAGNOSIS — Z510 Encounter for antineoplastic radiation therapy: Secondary | ICD-10-CM | POA: Insufficient documentation

## 2022-08-07 NOTE — Progress Notes (Signed)
  Radiation Oncology         [phone] ________________________________  Name: Mark Anthony MRN: [id number]  Date: 08/07/2022  DOB: 12-18-1975  COMPLEX SIMULATION NOTE  NARRATIVE:  The patient was brought to the [facility] today following prostate seed implantation approximately one month ago.  Identity was confirmed.  All relevant records and images related to the planned course of therapy were reviewed.  Then, the patient was set-up supine.  CT images were obtained.  The CT images were loaded into the planning software.  Then the prostate and rectum were contoured.  Treatment planning then occurred.  The implanted iodine 125 seeds were identified by the physics staff for projection of radiation distribution  I have requested : 3D Simulation  I have requested a DVH of the following structures: Prostate and rectum.    ________________________________  Sheral Apley Tammi Klippel, M.D.

## 2022-08-07 NOTE — Progress Notes (Signed)
  Radiation Oncology         [phone] ________________________________  Name: Mark Anthony MRN: [id number]  Date: 08/07/2022  DOB: 11-Dec-1975  SIMULATION AND TREATMENT PLANNING NOTE    ICD-10-CM   1. Prostate cancer ([place]); high risk; PSA 8.27; Gleason grade group 2,3,5  C61       DIAGNOSIS:   46 y.o. gentleman with locally advanced prostate cancer with Gleason score of 4+5, and PSA of 16.4.   NARRATIVE:  The patient was brought to the [place].  Identity was confirmed.  All relevant records and images related to the planned course of therapy were reviewed.  The patient freely provided informed written consent to proceed with treatment after reviewing the details related to the planned course of therapy. The consent form was witnessed and verified by the simulation staff.  Then, the patient was set-up in a stable reproducible supine position for radiation therapy.  A vacuum lock pillow device was custom fabricated to position his legs in a reproducible immobilized position.  Then, I performed a urethrogram under sterile conditions to identify the prostatic apex.  CT images were obtained.  Surface markings were placed.  The CT images were loaded into the planning software.  Then the prostate target and avoidance structures including the rectum, bladder, bowel and hips were contoured.  Treatment planning then occurred.  The radiation prescription was entered and confirmed.  A total of one complex treatment devices were fabricated. I have requested : Intensity Modulated Radiotherapy (IMRT) is medically necessary for this case for the following reason:  Rectal sparing.Marland Kitchen  PLAN:  The patient will receive 45 Gy in 25 fractions of 1.8 Gy and 60 Gy in 25 fractions of 2.4 Gy to the 2 PET positive lymph nodes, to supplement an up-front prostate seed implant boost of 110 Gy to achieve a total nominal dose of 155 Gy.  ________________________________  Sheral Apley Tammi Klippel, M.D.

## 2022-08-08 ENCOUNTER — Telehealth: Payer: Self-pay

## 2022-08-11 DIAGNOSIS — Z510 Encounter for antineoplastic radiation therapy: Secondary | ICD-10-CM | POA: Diagnosis not present

## 2022-08-11 NOTE — Telephone Encounter (Signed)
Per Dr. Alyson Ingles, FMLA to be completed by Radiation Oncology regarding time needed for patient radiation treatments. Wife was called and notified by staff in office. Faxed to [phone]

## 2022-08-12 ENCOUNTER — Encounter: Payer: Self-pay | Admitting: Radiation Oncology

## 2022-08-12 NOTE — Progress Notes (Signed)
  Radiation Oncology         [phone] ________________________________  Name: Mark Anthony MRN: [id number]  Date: 08/12/2022  DOB: June 22, 1976  3D Planning Note   Prostate Brachytherapy Post-Implant Dosimetry  Diagnosis:  46 y.o. gentleman with locally advanced prostate cancer with Gleason score of 4+5, and PSA of 16.4.   Narrative: On a previous date, Mark Anthony returned following prostate seed implantation for post implant planning. He underwent CT scan complex simulation to delineate the three-dimensional structures of the pelvis and demonstrate the radiation distribution.  Since that time, the seed localization, and complex isodose planning with dose volume histograms have now been completed.  Results:   Prostate Coverage - The dose of radiation delivered to the 90% or more of the prostate gland (D90) was 93.06% of the prescription dose. This exceeds our goal of greater than 90%. Rectal Sparing - The volume of rectal tissue receiving the prescription dose or higher was 0.0 cc. This falls under our thresholds tolerance of 1.0 cc.  Impression: The prostate seed implant appears to show adequate target coverage and appropriate rectal sparing.  Plan:  The patient will continue to follow with urology for ongoing PSA determinations. I would anticipate a high likelihood for local tumor control with minimal risk for rectal morbidity.  ________________________________  Sheral Apley Tammi Klippel, M.D.

## 2022-08-13 ENCOUNTER — Telehealth: Payer: Self-pay | Admitting: *Deleted

## 2022-08-13 ENCOUNTER — Ambulatory Visit (INDEPENDENT_AMBULATORY_CARE_PROVIDER_SITE_OTHER): Payer: No Typology Code available for payment source | Admitting: Urology

## 2022-08-13 ENCOUNTER — Encounter: Payer: Self-pay | Admitting: Urology

## 2022-08-13 VITALS — BP 127/79 | HR 114

## 2022-08-13 DIAGNOSIS — N401 Enlarged prostate with lower urinary tract symptoms: Secondary | ICD-10-CM | POA: Diagnosis not present

## 2022-08-13 DIAGNOSIS — R351 Nocturia: Secondary | ICD-10-CM

## 2022-08-13 DIAGNOSIS — R350 Frequency of micturition: Secondary | ICD-10-CM | POA: Diagnosis not present

## 2022-08-13 DIAGNOSIS — N138 Other obstructive and reflux uropathy: Secondary | ICD-10-CM

## 2022-08-13 LAB — BLADDER SCAN AMB NON-IMAGING: Scan Result: 1

## 2022-08-13 MED ORDER — ALFUZOSIN HCL ER 10 MG PO TB24: 10.0000 mg | ORAL_TABLET | Freq: Every day | ORAL | 11 refills | Status: DC

## 2022-08-13 NOTE — Telephone Encounter (Signed)
This nurse received FMLA for Mark Anthony spouse Mark Anthony [phone]) marked urgent.  Connected with spouse who confirmed patient D.O.B.  This nurse advised her of the required ROI for third party release of PHI by any means.  Allow 7-10 business days (14 calendar) to process forms and, asked information needed to complete LOA form.  "Need intermittent leave, first day out of work was for his surgery on 07/24/2022.  Will let him know to look for attachment e-mail from [email or web address]."  Denies further questions or needs.  Authorization successfully e-mailed to [email or web address].

## 2022-08-13 NOTE — Progress Notes (Signed)
08/13/2022 2:53 PM   Mark Anthony Jan 13, 1976 [id number]  Referring provider: No referring provider defined for this encounter.  Followup prostate cancer and BPH   HPI: Mark Anthony is a 46yo here for followup for prostate cancer and BPH. Since radioactive seed implant he has been having worsening urinary urgency and nocturia. IPSS 27 QOL 5 on no BPH therapy. He has nocturia 4-5x. He has a weak urinary stream and straining to urinate. Urinary frequency every hours. NO other complaints today   PMH: Past Medical History:  Diagnosis Date   Anxiety    Arthritis    knees, gets right knee injection q 4 months   Chronic low back pain with left-sided sciatica    Diabetes mellitus without complication (HCC)    diet controlled does not check cbg at home, last hemaglobin a1c was 7  in june 2023 per pt   Hypertension    Prostate cancer [facility])    dx june 2023   Reflux    Sleep apnea    osa sleep study done  06-27-2022 no cpap yet per pt on 07-07-2022   TIA (transient ischemic attack)    1 and 1/2 yrs ago per pt on 07-07-2022  sees pcp dr  s Duke Salvia at [facility]   Wears glasses     Surgical History: Past Surgical History:  Procedure Laterality Date   ACHILLES TENDON SURGERY Right 02/02/2020   Procedure: Right Achilles tendon debridement and reconstruction;  Surgeon: Wylene Simmer, MD;  Location: [place];  Service: Orthopedics;  Laterality: Right;   ACHILLES TENDON SURGERY Right 12/13/2020   Procedure: Right Achilles Tendon Reconstruction;  Surgeon: Wylene Simmer, MD;  Location: [place];  Service: Orthopedics;  Laterality: Right;   CYSTOSCOPY  07/24/2022   Procedure: CYSTOSCOPY;  Surgeon: Cleon Gustin, MD;  Location: [facility];  Service: Urology;;   ELBOW FRACTURE SURGERY Left    yrs ago   GASTRIC FUNDOPLICATION     yrs ago   GASTROC RECESSION EXTREMITY Right 12/13/2020   Procedure: Gastroc Recession;  Surgeon: Wylene Simmer, MD;   Location: [place];  Service: Orthopedics;  Laterality: Right;   RADIOACTIVE SEED IMPLANT N/A 07/24/2022   Procedure: RADIOACTIVE SEED IMPLANT/BRACHYTHERAPY IMPLANT;  Surgeon: Cleon Gustin, MD;  Location: [facility];  Service: Urology;  Laterality: N/A;   right thumb surgery     yrs ago   SPACE OAR INSTILLATION N/A 07/24/2022   Procedure: SPACE OAR INSTILLATION;  Surgeon: Cleon Gustin, MD;  Location: [facility];  Service: Urology;  Laterality: N/A;    Home Medications:  Allergies as of 08/13/2022       Reactions   Contrast Media [iodinated Contrast Media] Nausea And Vomiting, Swelling   Pt. Vomited immediately after IV dye injection.  Pt. Then had throat swelling and edema to uvula.     Acetaminophen    Other reaction(s): heart racing   Vicodin [hydrocodone-acetaminophen] Nausea And Vomiting        Medication List        Accurate as of August 13, 2022  2:53 PM. If you have any questions, ask your nurse or doctor.          abiraterone acetate 250 MG tablet Commonly known as: ZYTIGA Take 1,000 mg by mouth daily. Take on an empty stomach 1 hour before or 2 hours after a meal   amLODipine 10 MG tablet Commonly known as: NORVASC Take 1 tablet (  10 mg total) by mouth daily.   cetirizine 10 MG tablet Commonly known as: ZYRTEC TAKE ONE TABLET BY MOUTH DAILY AS NEEDED FOR ALLERGIES   meloxicam 7.5 MG tablet Commonly known as: MOBIC TAKE ONE TABLET BY MOUTH TWICE A DAY AS NEEDED FOR SEVERE PAIN   methocarbamol 500 MG tablet Commonly known as: ROBAXIN methocarbamol 500 mg tablet  Take 1 tablet every 8 hours by oral route as needed for spasm for 10 days.   nicotine 7 mg/24hr patch Commonly known as: NICODERM CQ - dosed in mg/24 hr Not currently using will start before surgery on 04-23-2022   predniSONE 5 MG tablet Commonly known as: DELTASONE Take 5 mg by mouth daily with breakfast.   traMADol 50 MG  tablet Commonly known as: Ultram Take 1 tablet (50 mg total) by mouth every 6 (six) hours as needed.        Allergies:  Allergies  Allergen Reactions   Contrast Media [Iodinated Contrast Media] Nausea And Vomiting and Swelling    Pt. Vomited immediately after IV dye injection.  Pt. Then had throat swelling and edema to uvula.     Acetaminophen     Other reaction(s): heart racing   Vicodin [Hydrocodone-Acetaminophen] Nausea And Vomiting    Family History: Family History  Problem Relation Age of Onset   Prostate cancer Father     Social History:  reports that he quit smoking about 2 years ago. His smoking use included cigarettes and cigars. He has a 26.00 pack-year smoking history. He has never used smokeless tobacco. He reports that he does not currently use alcohol. He reports that he does not use drugs.  ROS: All other review of systems were reviewed and are negative except what is noted above in HPI  Physical Exam: BP 127/79   Pulse (!) 114   Constitutional:  Alert and oriented, No acute distress. HEENT: [place] AT, moist mucus membranes.  Trachea midline, no masses. Cardiovascular: No clubbing, cyanosis, or edema. Respiratory: Normal respiratory effort, no increased work of breathing. GI: Abdomen is soft, nontender, nondistended, no abdominal masses GU: No CVA tenderness.  Lymph: No cervical or inguinal lymphadenopathy. Skin: No rashes, bruises or suspicious lesions. Neurologic: Grossly intact, no focal deficits, moving all 4 extremities. Psychiatric: Normal mood and affect.  Laboratory Data: Lab Results  Component Value Date   WBC 15.5 (H) 05/24/2021   HGB 17.0 07/24/2022   HCT 50.0 07/24/2022   MCV 91.6 05/24/2021   PLT 358 05/24/2021    Lab Results  Component Value Date   CREATININE 0.80 07/24/2022    No results found for: "PSA"  No results found for: "TESTOSTERONE"  Lab Results  Component Value Date   HGBA1C 7.0 (H) 05/24/2021    Urinalysis     Component Value Date/Time   COLORURINE STRAW (A) 05/24/2021 0303   APPEARANCEUR CLEAR 05/24/2021 0303   LABSPEC 1.029 05/24/2021 0303   PHURINE 7.0 05/24/2021 0303   GLUCOSEU 50 (A) 05/24/2021 0303   HGBUR NEGATIVE 05/24/2021 0303   BILIRUBINUR NEGATIVE 05/24/2021 0303   KETONESUR NEGATIVE 05/24/2021 0303   PROTEINUR NEGATIVE 05/24/2021 0303   NITRITE NEGATIVE 05/24/2021 0303   LEUKOCYTESUR NEGATIVE 05/24/2021 0303    No results found for: "LABMICR", "WBCUA", "RBCUA", "LABEPIT", "MUCUS", "BACTERIA"  Pertinent Imaging:  No results found for this or any previous visit.  No results found for this or any previous visit.  No results found for this or any previous visit.  No results found for this or any  previous visit.  No results found for this or any previous visit.  No valid procedures specified. No results found for this or any previous visit.  No results found for this or any previous visit.   Assessment & Plan:    1.BPH with LUTS --We start uroxatral '10mg'$  qhs - Urinalysis, Routine w reflex microscopic - BLADDER SCAN AMB NON-IMAGING  2. Nocturia  -Continue uroxatral '10mg'$   3. Urinary frequency -We will start uroxatral '10mg'$     No follow-ups on file.  Nicolette Bang, MD  [facility] [place]

## 2022-08-13 NOTE — Progress Notes (Signed)
post void residual=1 

## 2022-08-13 NOTE — Patient Instructions (Signed)

## 2022-08-14 ENCOUNTER — Telehealth: Payer: Self-pay | Admitting: *Deleted

## 2022-08-14 LAB — URINALYSIS, ROUTINE W REFLEX MICROSCOPIC
Bilirubin, UA: NEGATIVE
Glucose, UA: NEGATIVE
Nitrite, UA: NEGATIVE
Specific Gravity, UA: 1.025 (ref 1.005–1.030)
Urobilinogen, Ur: 0.2 mg/dL (ref 0.2–1.0)
pH, UA: 5 (ref 5.0–7.5)

## 2022-08-14 LAB — MICROSCOPIC EXAMINATION
Bacteria, UA: NONE SEEN
Epithelial Cells (non renal): NONE SEEN /hpf (ref 0–10)

## 2022-08-14 NOTE — Telephone Encounter (Signed)
Medical leave form completed by this nurse at this time.  Folder to radiation advanced practice professional in box bin for for provider review, signature, and return.

## 2022-08-15 NOTE — Telephone Encounter (Addendum)
Connected with Kate Sweetman about message received with verbal authorization to use PHI to complete LOA for spouse to advise signed authorization required. E-mail received with signed ROI from [employer].  Received paperwork signed by provider.  Wife's paperwork successfully faxed at 3:02 pm.  Attn Bronson Ing to fax number [phone].  Envelope addressed to patient is ready for pick-up on next appointment from [facility] office entry level registrars.  Notification of above e-mailed to pt.  Copy to radiation oncology bin designated for items to be scanned completes process.  No further instructions received, actions required or performed by this nurse.

## 2022-08-19 ENCOUNTER — Ambulatory Visit
Admission: RE | Admit: 2022-08-19 | Discharge: 2022-08-19 | Disposition: A | Discharge status: Home / Self Care | Payer: No Typology Code available for payment source | Source: Ambulatory Visit | Attending: Radiation Oncology | Admitting: Radiation Oncology

## 2022-08-19 ENCOUNTER — Other Ambulatory Visit: Payer: Self-pay

## 2022-08-19 DIAGNOSIS — Z510 Encounter for antineoplastic radiation therapy: Secondary | ICD-10-CM | POA: Diagnosis not present

## 2022-08-19 LAB — RAD ONC ARIA SESSION SUMMARY
Course Elapsed Days: 0
Plan Fractions Treated to Date: 1
Plan Prescribed Dose Per Fraction: 1.8 Gy
Plan Total Fractions Prescribed: 25
Plan Total Prescribed Dose: 45 Gy
Reference Point Dosage Given to Date: 1.8 Gy
Reference Point Session Dosage Given: 1.8 Gy
Session Number: 1

## 2022-08-20 ENCOUNTER — Other Ambulatory Visit: Payer: Self-pay

## 2022-08-20 ENCOUNTER — Ambulatory Visit
Admission: RE | Admit: 2022-08-20 | Discharge: 2022-08-20 | Disposition: A | Discharge status: Home / Self Care | Payer: No Typology Code available for payment source | Source: Ambulatory Visit | Attending: Radiation Oncology | Admitting: Radiation Oncology

## 2022-08-20 DIAGNOSIS — Z510 Encounter for antineoplastic radiation therapy: Secondary | ICD-10-CM | POA: Diagnosis not present

## 2022-08-20 LAB — RAD ONC ARIA SESSION SUMMARY
Course Elapsed Days: 1
Plan Fractions Treated to Date: 2
Plan Prescribed Dose Per Fraction: 1.8 Gy
Plan Total Fractions Prescribed: 25
Plan Total Prescribed Dose: 45 Gy
Reference Point Dosage Given to Date: 3.6 Gy
Reference Point Session Dosage Given: 1.8 Gy
Session Number: 2

## 2022-08-21 ENCOUNTER — Other Ambulatory Visit: Payer: Self-pay

## 2022-08-21 ENCOUNTER — Ambulatory Visit
Admission: RE | Admit: 2022-08-21 | Discharge: 2022-08-21 | Disposition: A | Discharge status: Home / Self Care | Payer: No Typology Code available for payment source | Source: Ambulatory Visit | Attending: Radiation Oncology | Admitting: Radiation Oncology

## 2022-08-21 DIAGNOSIS — Z510 Encounter for antineoplastic radiation therapy: Secondary | ICD-10-CM | POA: Diagnosis not present

## 2022-08-21 LAB — RAD ONC ARIA SESSION SUMMARY
Course Elapsed Days: 2
Plan Fractions Treated to Date: 3
Plan Prescribed Dose Per Fraction: 1.8 Gy
Plan Total Fractions Prescribed: 25
Plan Total Prescribed Dose: 45 Gy
Reference Point Dosage Given to Date: 5.4 Gy
Reference Point Session Dosage Given: 1.8 Gy
Session Number: 3

## 2022-08-22 ENCOUNTER — Ambulatory Visit
Admission: RE | Admit: 2022-08-22 | Discharge: 2022-08-22 | Disposition: A | Discharge status: Home / Self Care | Payer: No Typology Code available for payment source | Source: Ambulatory Visit | Attending: Radiation Oncology | Admitting: Radiation Oncology

## 2022-08-22 ENCOUNTER — Other Ambulatory Visit: Payer: Self-pay

## 2022-08-22 DIAGNOSIS — Z510 Encounter for antineoplastic radiation therapy: Secondary | ICD-10-CM | POA: Diagnosis not present

## 2022-08-22 LAB — RAD ONC ARIA SESSION SUMMARY
Course Elapsed Days: 3
Plan Fractions Treated to Date: 4
Plan Prescribed Dose Per Fraction: 1.8 Gy
Plan Total Fractions Prescribed: 25
Plan Total Prescribed Dose: 45 Gy
Reference Point Dosage Given to Date: 7.2 Gy
Reference Point Session Dosage Given: 1.8 Gy
Session Number: 4

## 2022-08-25 ENCOUNTER — Other Ambulatory Visit: Payer: Self-pay

## 2022-08-25 ENCOUNTER — Ambulatory Visit
Admission: RE | Admit: 2022-08-25 | Discharge: 2022-08-25 | Disposition: A | Discharge status: Home / Self Care | Payer: No Typology Code available for payment source | Source: Ambulatory Visit | Attending: Radiation Oncology | Admitting: Radiation Oncology

## 2022-08-25 DIAGNOSIS — Z510 Encounter for antineoplastic radiation therapy: Secondary | ICD-10-CM | POA: Diagnosis not present

## 2022-08-25 LAB — RAD ONC ARIA SESSION SUMMARY
Course Elapsed Days: 6
Plan Fractions Treated to Date: 5
Plan Prescribed Dose Per Fraction: 1.8 Gy
Plan Total Fractions Prescribed: 25
Plan Total Prescribed Dose: 45 Gy
Reference Point Dosage Given to Date: 9 Gy
Reference Point Session Dosage Given: 1.8 Gy
Session Number: 5

## 2022-08-26 ENCOUNTER — Other Ambulatory Visit: Payer: Self-pay

## 2022-08-26 ENCOUNTER — Ambulatory Visit
Admission: RE | Admit: 2022-08-26 | Discharge: 2022-08-26 | Disposition: A | Discharge status: Home / Self Care | Payer: No Typology Code available for payment source | Source: Ambulatory Visit | Attending: Radiation Oncology | Admitting: Radiation Oncology

## 2022-08-26 DIAGNOSIS — Z510 Encounter for antineoplastic radiation therapy: Secondary | ICD-10-CM | POA: Diagnosis not present

## 2022-08-26 LAB — RAD ONC ARIA SESSION SUMMARY
Course Elapsed Days: 7
Plan Fractions Treated to Date: 6
Plan Prescribed Dose Per Fraction: 1.8 Gy
Plan Total Fractions Prescribed: 25
Plan Total Prescribed Dose: 45 Gy
Reference Point Dosage Given to Date: 10.8 Gy
Reference Point Session Dosage Given: 1.8 Gy
Session Number: 6

## 2022-08-27 ENCOUNTER — Other Ambulatory Visit: Payer: Self-pay

## 2022-08-27 ENCOUNTER — Ambulatory Visit
Admission: RE | Admit: 2022-08-27 | Discharge: 2022-08-27 | Disposition: A | Discharge status: Home / Self Care | Payer: No Typology Code available for payment source | Source: Ambulatory Visit | Attending: Radiation Oncology | Admitting: Radiation Oncology

## 2022-08-27 DIAGNOSIS — Z510 Encounter for antineoplastic radiation therapy: Secondary | ICD-10-CM | POA: Diagnosis not present

## 2022-08-27 DIAGNOSIS — C61 Malignant neoplasm of prostate: Secondary | ICD-10-CM | POA: Diagnosis present

## 2022-08-27 LAB — RAD ONC ARIA SESSION SUMMARY
Course Elapsed Days: 8
Plan Fractions Treated to Date: 7
Plan Prescribed Dose Per Fraction: 1.8 Gy
Plan Total Fractions Prescribed: 25
Plan Total Prescribed Dose: 45 Gy
Reference Point Dosage Given to Date: 12.6 Gy
Reference Point Session Dosage Given: 1.8 Gy
Session Number: 7

## 2022-08-28 ENCOUNTER — Ambulatory Visit
Admission: RE | Admit: 2022-08-28 | Discharge: 2022-08-28 | Disposition: A | Discharge status: Home / Self Care | Payer: No Typology Code available for payment source | Source: Ambulatory Visit | Attending: Radiation Oncology | Admitting: Radiation Oncology

## 2022-08-28 ENCOUNTER — Other Ambulatory Visit: Payer: Self-pay

## 2022-08-28 DIAGNOSIS — Z510 Encounter for antineoplastic radiation therapy: Secondary | ICD-10-CM | POA: Diagnosis not present

## 2022-08-28 LAB — RAD ONC ARIA SESSION SUMMARY
Course Elapsed Days: 9
Plan Fractions Treated to Date: 8
Plan Prescribed Dose Per Fraction: 1.8 Gy
Plan Total Fractions Prescribed: 25
Plan Total Prescribed Dose: 45 Gy
Reference Point Dosage Given to Date: 14.4 Gy
Reference Point Session Dosage Given: 1.8 Gy
Session Number: 8

## 2022-08-29 ENCOUNTER — Ambulatory Visit
Admission: RE | Admit: 2022-08-29 | Discharge: 2022-08-29 | Disposition: A | Discharge status: Home / Self Care | Payer: No Typology Code available for payment source | Source: Ambulatory Visit | Attending: Radiation Oncology | Admitting: Radiation Oncology

## 2022-08-29 ENCOUNTER — Other Ambulatory Visit: Payer: Self-pay

## 2022-08-29 DIAGNOSIS — Z510 Encounter for antineoplastic radiation therapy: Secondary | ICD-10-CM | POA: Diagnosis not present

## 2022-08-29 LAB — RAD ONC ARIA SESSION SUMMARY
Course Elapsed Days: 10
Plan Fractions Treated to Date: 9
Plan Prescribed Dose Per Fraction: 1.8 Gy
Plan Total Fractions Prescribed: 25
Plan Total Prescribed Dose: 45 Gy
Reference Point Dosage Given to Date: 16.2 Gy
Reference Point Session Dosage Given: 1.8 Gy
Session Number: 9

## 2022-08-30 DIAGNOSIS — G4730 Sleep apnea, unspecified: Secondary | ICD-10-CM | POA: Diagnosis not present

## 2022-08-30 NOTE — Procedures (Signed)
   [facility]    Patient Name: Mark Anthony, Mark Anthony Date: 06/27/2022 Gender: Male D.O.B: [birth year] Age (years): 46 Referring Provider: Alice Reichert Height (inches): 66 Interpreting Physician: Baird Lyons MD, ABSM Weight (lbs): 261 RPSGT: Peak, Robert BMI: 34 MRN: [id number] Neck Size: 19.00  CLINICAL INFORMATION Sleep Study Type: NPSG Indication for sleep study: OSA Epworth Sleepiness Score: N/A  SLEEP STUDY TECHNIQUE As per the AASM Manual for the Scoring of Sleep and Associated Events v2.3 (April 2016) with a hypopnea requiring 4% desaturations.  The channels recorded and monitored were frontal, central and occipital EEG, electrooculogram (EOG), submentalis EMG (chin), nasal and oral airflow, thoracic and abdominal wall motion, anterior tibialis EMG, snore microphone, electrocardiogram, and pulse oximetry.  MEDICATIONS Medications self-administered by patient taken the night of the study : none reported  SLEEP ARCHITECTURE The study was initiated at 10:44:08 PM and ended at 4:49:45 AM.  Sleep onset time was 20.8 minutes and the sleep efficiency was 86.0%. The total sleep time was 314.3 minutes.  Stage REM latency was 136.0 minutes.  The patient spent 6.52% of the night in stage N1 sleep, 76.46% in stage N2 sleep, 6.20% in stage N3 and 10.8% in REM.  Alpha intrusion was absent.  Supine sleep was 2.07%.  RESPIRATORY PARAMETERS The overall apnea/hypopnea index (AHI) was 7.6 per hour. There were 1 total apneas, including 1 obstructive, 0 central and 0 mixed apneas. There were 39 hypopneas and 0 RERAs.  The AHI during Stage REM sleep was 45.9 per hour.  AHI while supine was 83.1 per hour.  The mean oxygen saturation was 93.62%. The minimum SpO2 during sleep was 81.00%.  loud snoring was noted during this study.  CARDIAC DATA The 2 lead EKG demonstrated sinus rhythm. The mean heart rate was 70.17 beats per minute.  Other EKG findings include: None.  LEG MOVEMENT DATA The total PLMS were 0 with a resulting PLMS index of 0.00. Associated arousal with leg movement index was 0.0 .  IMPRESSIONS - Mild obstructive sleep apnea occurred during this study (AHI = 7.6/h). - Insufficient events to meet protocol requirement for split CPAP titration. - Mild oxygen desaturation was noted during this study (Min O2 = 81.00%). Mean 93.6% - The patient snored with loud snoring volume. - No cardiac abnormalities were noted during this study. - Clinically significant periodic limb movements did not occur during sleep. No significant associated arousals.  DIAGNOSIS - Obstructive Sleep Apnea (G47.33)  RECOMMENDATIONS - Treatment for very mild OSA is directed by symptoms and co-morbidity. Conservative measures may include observation, weight loss and sleep position off back. Other options, including CPAP, a fitted oral appliance or ENT evaluation, would be based on clinical judgment. - Be careful with alcohol, sedatives and other CNS depressants that may worsen sleep apnea and disrupt normal sleep architecture. - Sleep hygiene should be reviewed to assess factors that may improve sleep quality. - Weight management and regular exercise should be initiated or continued if appropriate.  [Electronically signed] 08/30/2022 02:35 PM  Baird Lyons MD, [place], American Board of Sleep Medicine NPI: [id number]                        [place] [place] [facility]  ELECTRONICALLY SIGNED ON:  08/30/2022, 2:27 PM [place] PH: [phone]   FX: [phone] [place]

## 2022-09-01 ENCOUNTER — Ambulatory Visit
Admission: RE | Admit: 2022-09-01 | Discharge: 2022-09-01 | Disposition: A | Discharge status: Home / Self Care | Payer: No Typology Code available for payment source | Source: Ambulatory Visit | Attending: Radiation Oncology | Admitting: Radiation Oncology

## 2022-09-01 ENCOUNTER — Other Ambulatory Visit: Payer: Self-pay

## 2022-09-01 DIAGNOSIS — Z510 Encounter for antineoplastic radiation therapy: Secondary | ICD-10-CM | POA: Diagnosis not present

## 2022-09-01 LAB — RAD ONC ARIA SESSION SUMMARY
Course Elapsed Days: 13
Plan Fractions Treated to Date: 10
Plan Prescribed Dose Per Fraction: 1.8 Gy
Plan Total Fractions Prescribed: 25
Plan Total Prescribed Dose: 45 Gy
Reference Point Dosage Given to Date: 18 Gy
Reference Point Session Dosage Given: 1.8 Gy
Session Number: 10

## 2022-09-02 ENCOUNTER — Ambulatory Visit
Admission: RE | Admit: 2022-09-02 | Discharge: 2022-09-02 | Disposition: A | Discharge status: Home / Self Care | Payer: No Typology Code available for payment source | Source: Ambulatory Visit | Attending: Radiation Oncology | Admitting: Radiation Oncology

## 2022-09-02 ENCOUNTER — Other Ambulatory Visit: Payer: Self-pay

## 2022-09-02 DIAGNOSIS — Z510 Encounter for antineoplastic radiation therapy: Secondary | ICD-10-CM | POA: Diagnosis not present

## 2022-09-02 LAB — RAD ONC ARIA SESSION SUMMARY
Course Elapsed Days: 14
Plan Fractions Treated to Date: 11
Plan Prescribed Dose Per Fraction: 1.8 Gy
Plan Total Fractions Prescribed: 25
Plan Total Prescribed Dose: 45 Gy
Reference Point Dosage Given to Date: 19.8 Gy
Reference Point Session Dosage Given: 1.8 Gy
Session Number: 11

## 2022-09-03 ENCOUNTER — Ambulatory Visit
Admission: RE | Admit: 2022-09-03 | Discharge: 2022-09-03 | Disposition: A | Discharge status: Home / Self Care | Payer: No Typology Code available for payment source | Source: Ambulatory Visit | Attending: Radiation Oncology | Admitting: Radiation Oncology

## 2022-09-03 ENCOUNTER — Other Ambulatory Visit: Payer: Self-pay

## 2022-09-03 DIAGNOSIS — Z510 Encounter for antineoplastic radiation therapy: Secondary | ICD-10-CM | POA: Diagnosis not present

## 2022-09-03 LAB — RAD ONC ARIA SESSION SUMMARY
Course Elapsed Days: 15
Plan Fractions Treated to Date: 12
Plan Prescribed Dose Per Fraction: 1.8 Gy
Plan Total Fractions Prescribed: 25
Plan Total Prescribed Dose: 45 Gy
Reference Point Dosage Given to Date: 21.6 Gy
Reference Point Session Dosage Given: 1.8 Gy
Session Number: 12

## 2022-09-04 ENCOUNTER — Other Ambulatory Visit: Payer: Self-pay

## 2022-09-04 ENCOUNTER — Ambulatory Visit
Admission: RE | Admit: 2022-09-04 | Discharge: 2022-09-04 | Disposition: A | Discharge status: Home / Self Care | Payer: No Typology Code available for payment source | Source: Ambulatory Visit | Attending: Radiation Oncology | Admitting: Radiation Oncology

## 2022-09-04 DIAGNOSIS — Z510 Encounter for antineoplastic radiation therapy: Secondary | ICD-10-CM | POA: Diagnosis not present

## 2022-09-04 LAB — RAD ONC ARIA SESSION SUMMARY
Course Elapsed Days: 16
Plan Fractions Treated to Date: 13
Plan Prescribed Dose Per Fraction: 1.8 Gy
Plan Total Fractions Prescribed: 25
Plan Total Prescribed Dose: 45 Gy
Reference Point Dosage Given to Date: 23.4 Gy
Reference Point Session Dosage Given: 1.8 Gy
Session Number: 13

## 2022-09-05 ENCOUNTER — Other Ambulatory Visit: Payer: Self-pay

## 2022-09-05 ENCOUNTER — Ambulatory Visit
Admission: RE | Admit: 2022-09-05 | Discharge: 2022-09-05 | Disposition: A | Discharge status: Home / Self Care | Payer: No Typology Code available for payment source | Source: Ambulatory Visit | Attending: Radiation Oncology | Admitting: Radiation Oncology

## 2022-09-05 DIAGNOSIS — Z510 Encounter for antineoplastic radiation therapy: Secondary | ICD-10-CM | POA: Diagnosis not present

## 2022-09-05 LAB — RAD ONC ARIA SESSION SUMMARY
Course Elapsed Days: 17
Plan Fractions Treated to Date: 14
Plan Prescribed Dose Per Fraction: 1.8 Gy
Plan Total Fractions Prescribed: 25
Plan Total Prescribed Dose: 45 Gy
Reference Point Dosage Given to Date: 25.2 Gy
Reference Point Session Dosage Given: 1.8 Gy
Session Number: 14

## 2022-09-08 ENCOUNTER — Other Ambulatory Visit: Payer: Self-pay

## 2022-09-08 ENCOUNTER — Ambulatory Visit
Admission: RE | Admit: 2022-09-08 | Discharge: 2022-09-08 | Disposition: A | Discharge status: Home / Self Care | Payer: No Typology Code available for payment source | Source: Ambulatory Visit | Attending: Radiation Oncology | Admitting: Radiation Oncology

## 2022-09-08 DIAGNOSIS — Z510 Encounter for antineoplastic radiation therapy: Secondary | ICD-10-CM | POA: Diagnosis not present

## 2022-09-08 LAB — RAD ONC ARIA SESSION SUMMARY
Course Elapsed Days: 20
Plan Fractions Treated to Date: 15
Plan Prescribed Dose Per Fraction: 1.8 Gy
Plan Total Fractions Prescribed: 25
Plan Total Prescribed Dose: 45 Gy
Reference Point Dosage Given to Date: 27 Gy
Reference Point Session Dosage Given: 1.8 Gy
Session Number: 15

## 2022-09-09 ENCOUNTER — Other Ambulatory Visit: Payer: Self-pay

## 2022-09-09 ENCOUNTER — Ambulatory Visit
Admission: RE | Admit: 2022-09-09 | Discharge: 2022-09-09 | Disposition: A | Discharge status: Home / Self Care | Payer: No Typology Code available for payment source | Source: Ambulatory Visit | Attending: Radiation Oncology | Admitting: Radiation Oncology

## 2022-09-09 DIAGNOSIS — Z510 Encounter for antineoplastic radiation therapy: Secondary | ICD-10-CM | POA: Diagnosis not present

## 2022-09-09 LAB — RAD ONC ARIA SESSION SUMMARY
Course Elapsed Days: 21
Plan Fractions Treated to Date: 16
Plan Prescribed Dose Per Fraction: 1.8 Gy
Plan Total Fractions Prescribed: 25
Plan Total Prescribed Dose: 45 Gy
Reference Point Dosage Given to Date: 28.8 Gy
Reference Point Session Dosage Given: 1.8 Gy
Session Number: 16

## 2022-09-10 ENCOUNTER — Other Ambulatory Visit: Payer: Self-pay

## 2022-09-10 ENCOUNTER — Ambulatory Visit
Admission: RE | Admit: 2022-09-10 | Discharge: 2022-09-10 | Disposition: A | Discharge status: Home / Self Care | Payer: No Typology Code available for payment source | Source: Ambulatory Visit | Attending: Radiation Oncology | Admitting: Radiation Oncology

## 2022-09-10 DIAGNOSIS — Z510 Encounter for antineoplastic radiation therapy: Secondary | ICD-10-CM | POA: Diagnosis not present

## 2022-09-10 LAB — RAD ONC ARIA SESSION SUMMARY
Course Elapsed Days: 22
Plan Fractions Treated to Date: 17
Plan Prescribed Dose Per Fraction: 1.8 Gy
Plan Total Fractions Prescribed: 25
Plan Total Prescribed Dose: 45 Gy
Reference Point Dosage Given to Date: 30.6 Gy
Reference Point Session Dosage Given: 1.8 Gy
Session Number: 17

## 2022-09-11 ENCOUNTER — Ambulatory Visit
Admission: RE | Admit: 2022-09-11 | Discharge: 2022-09-11 | Disposition: A | Discharge status: Home / Self Care | Payer: No Typology Code available for payment source | Source: Ambulatory Visit | Attending: Radiation Oncology | Admitting: Radiation Oncology

## 2022-09-11 ENCOUNTER — Other Ambulatory Visit: Payer: Self-pay

## 2022-09-11 DIAGNOSIS — Z510 Encounter for antineoplastic radiation therapy: Secondary | ICD-10-CM | POA: Diagnosis not present

## 2022-09-11 LAB — RAD ONC ARIA SESSION SUMMARY
Course Elapsed Days: 23
Plan Fractions Treated to Date: 18
Plan Prescribed Dose Per Fraction: 1.8 Gy
Plan Total Fractions Prescribed: 25
Plan Total Prescribed Dose: 45 Gy
Reference Point Dosage Given to Date: 32.4 Gy
Reference Point Session Dosage Given: 1.8 Gy
Session Number: 18

## 2022-09-12 ENCOUNTER — Other Ambulatory Visit: Payer: Self-pay

## 2022-09-12 ENCOUNTER — Ambulatory Visit
Admission: RE | Admit: 2022-09-12 | Discharge: 2022-09-12 | Disposition: A | Discharge status: Home / Self Care | Payer: No Typology Code available for payment source | Source: Ambulatory Visit | Attending: Radiation Oncology | Admitting: Radiation Oncology

## 2022-09-12 DIAGNOSIS — Z510 Encounter for antineoplastic radiation therapy: Secondary | ICD-10-CM | POA: Diagnosis not present

## 2022-09-12 LAB — RAD ONC ARIA SESSION SUMMARY
Course Elapsed Days: 24
Plan Fractions Treated to Date: 19
Plan Prescribed Dose Per Fraction: 1.8 Gy
Plan Total Fractions Prescribed: 25
Plan Total Prescribed Dose: 45 Gy
Reference Point Dosage Given to Date: 34.2 Gy
Reference Point Session Dosage Given: 1.8 Gy
Session Number: 19

## 2022-09-14 ENCOUNTER — Other Ambulatory Visit: Payer: Self-pay

## 2022-09-14 ENCOUNTER — Ambulatory Visit
Admission: RE | Admit: 2022-09-14 | Discharge: 2022-09-14 | Disposition: A | Discharge status: Home / Self Care | Payer: No Typology Code available for payment source | Source: Ambulatory Visit | Attending: Radiation Oncology | Admitting: Radiation Oncology

## 2022-09-14 DIAGNOSIS — Z510 Encounter for antineoplastic radiation therapy: Secondary | ICD-10-CM | POA: Diagnosis not present

## 2022-09-14 LAB — RAD ONC ARIA SESSION SUMMARY
Course Elapsed Days: 26
Plan Fractions Treated to Date: 20
Plan Prescribed Dose Per Fraction: 1.8 Gy
Plan Total Fractions Prescribed: 25
Plan Total Prescribed Dose: 45 Gy
Reference Point Dosage Given to Date: 36 Gy
Reference Point Session Dosage Given: 1.8 Gy
Session Number: 20

## 2022-09-15 ENCOUNTER — Ambulatory Visit
Admission: RE | Admit: 2022-09-15 | Discharge: 2022-09-15 | Disposition: A | Discharge status: Home / Self Care | Payer: No Typology Code available for payment source | Source: Ambulatory Visit | Attending: Radiation Oncology | Admitting: Radiation Oncology

## 2022-09-15 ENCOUNTER — Other Ambulatory Visit: Payer: Self-pay

## 2022-09-15 DIAGNOSIS — Z510 Encounter for antineoplastic radiation therapy: Secondary | ICD-10-CM | POA: Diagnosis not present

## 2022-09-15 LAB — RAD ONC ARIA SESSION SUMMARY
Course Elapsed Days: 27
Plan Fractions Treated to Date: 21
Plan Prescribed Dose Per Fraction: 1.8 Gy
Plan Total Fractions Prescribed: 25
Plan Total Prescribed Dose: 45 Gy
Reference Point Dosage Given to Date: 37.8 Gy
Reference Point Session Dosage Given: 1.8 Gy
Session Number: 21

## 2022-09-16 ENCOUNTER — Other Ambulatory Visit: Payer: Self-pay

## 2022-09-16 ENCOUNTER — Ambulatory Visit
Admission: RE | Admit: 2022-09-16 | Discharge: 2022-09-16 | Disposition: A | Discharge status: Home / Self Care | Payer: No Typology Code available for payment source | Source: Ambulatory Visit | Attending: Radiation Oncology | Admitting: Radiation Oncology

## 2022-09-16 DIAGNOSIS — Z510 Encounter for antineoplastic radiation therapy: Secondary | ICD-10-CM | POA: Diagnosis not present

## 2022-09-16 LAB — RAD ONC ARIA SESSION SUMMARY
Course Elapsed Days: 28
Plan Fractions Treated to Date: 22
Plan Prescribed Dose Per Fraction: 1.8 Gy
Plan Total Fractions Prescribed: 25
Plan Total Prescribed Dose: 45 Gy
Reference Point Dosage Given to Date: 39.6 Gy
Reference Point Session Dosage Given: 1.8 Gy
Session Number: 22

## 2022-09-17 ENCOUNTER — Other Ambulatory Visit: Payer: Self-pay

## 2022-09-17 ENCOUNTER — Ambulatory Visit
Admission: RE | Admit: 2022-09-17 | Discharge: 2022-09-17 | Disposition: A | Discharge status: Home / Self Care | Payer: No Typology Code available for payment source | Source: Ambulatory Visit | Attending: Radiation Oncology | Admitting: Radiation Oncology

## 2022-09-17 ENCOUNTER — Ambulatory Visit: Payer: No Typology Code available for payment source

## 2022-09-17 DIAGNOSIS — Z510 Encounter for antineoplastic radiation therapy: Secondary | ICD-10-CM | POA: Diagnosis not present

## 2022-09-17 LAB — RAD ONC ARIA SESSION SUMMARY
Course Elapsed Days: 29
Plan Fractions Treated to Date: 23
Plan Prescribed Dose Per Fraction: 1.8 Gy
Plan Total Fractions Prescribed: 25
Plan Total Prescribed Dose: 45 Gy
Reference Point Dosage Given to Date: 41.4 Gy
Reference Point Session Dosage Given: 1.8 Gy
Session Number: 23

## 2022-09-22 ENCOUNTER — Other Ambulatory Visit: Payer: Self-pay

## 2022-09-22 ENCOUNTER — Ambulatory Visit
Admission: RE | Admit: 2022-09-22 | Discharge: 2022-09-22 | Disposition: A | Discharge status: Home / Self Care | Payer: No Typology Code available for payment source | Source: Ambulatory Visit | Attending: Radiation Oncology | Admitting: Radiation Oncology

## 2022-09-22 ENCOUNTER — Ambulatory Visit (INDEPENDENT_AMBULATORY_CARE_PROVIDER_SITE_OTHER): Payer: No Typology Code available for payment source | Admitting: Urology

## 2022-09-22 VITALS — BP 164/98 | HR 120

## 2022-09-22 DIAGNOSIS — N138 Other obstructive and reflux uropathy: Secondary | ICD-10-CM

## 2022-09-22 DIAGNOSIS — R351 Nocturia: Secondary | ICD-10-CM | POA: Diagnosis not present

## 2022-09-22 DIAGNOSIS — N401 Enlarged prostate with lower urinary tract symptoms: Secondary | ICD-10-CM

## 2022-09-22 DIAGNOSIS — R350 Frequency of micturition: Secondary | ICD-10-CM | POA: Diagnosis not present

## 2022-09-22 DIAGNOSIS — C61 Malignant neoplasm of prostate: Secondary | ICD-10-CM

## 2022-09-22 DIAGNOSIS — Z510 Encounter for antineoplastic radiation therapy: Secondary | ICD-10-CM | POA: Diagnosis not present

## 2022-09-22 LAB — RAD ONC ARIA SESSION SUMMARY
Course Elapsed Days: 34
Plan Fractions Treated to Date: 24
Plan Prescribed Dose Per Fraction: 1.8 Gy
Plan Total Fractions Prescribed: 25
Plan Total Prescribed Dose: 45 Gy
Reference Point Dosage Given to Date: 43.2 Gy
Reference Point Session Dosage Given: 1.8 Gy
Session Number: 24

## 2022-09-22 MED ORDER — SILODOSIN 8 MG PO CAPS: 8.0000 mg | ORAL_CAPSULE | Freq: Every day | ORAL | 11 refills | Status: DC

## 2022-09-22 NOTE — Patient Instructions (Signed)
Prostate Cancer  The prostate is a small gland that produces fluid that makes up semen (seminal fluid). It is located below the bladder in men, in front of the rectum. Prostate cancer is the abnormal growth of cells in the prostate gland. What are the causes? The exact cause of this condition is not known. What increases the risk? You are more likely to develop this condition if: You are 47 years of age or older. You have a family history of prostate cancer. You have a family history of breast and ovarian cancer. You have genes that are passed from parent to child (inherited), such as BRCA1 and BRCA2. You have Lynch syndrome. African American men and men of African descent are diagnosed with prostate cancer at higher rates than other men. The reasons for this are not well understood and are likely due to a combination of genetic and environmental factors. What are the signs or symptoms? Symptoms of this condition include: Problems with urination. This may include: A weak or interrupted flow of urine. Trouble starting or stopping urination. Trouble emptying the bladder all the way. The need to urinate more often, especially at night. Blood in urine or semen. Persistent pain or discomfort in the lower back, lower abdomen, or hips. Trouble getting an erection. Weakness or numbness in the legs or feet. How is this diagnosed? This condition can be diagnosed with: A digital rectal exam. For this exam, a health care provider inserts a gloved finger into the rectum to feel the prostate gland. A blood test called a prostate-specific antigen (PSA) test. A procedure in which a sample of tissue is taken from the prostate and checked under a microscope (prostate biopsy). An imaging test called transrectal ultrasonography. Once the condition is diagnosed, tests will be done to determine how far the cancer has spread. This is called staging the cancer. Staging may involve imaging tests, such as a bone  scan, CT scan, PET scan, or MRI. Stages of prostate cancer The stages of prostate cancer are as follows: Stage 1 (I). At this stage, the cancer is found in the prostate only. The cancer is not visible on imaging tests, and it is usually found by accident, such as during prostate surgery. Stage 2 (II). At this stage, the cancer is more advanced than it is in stage 1, but the cancer has not spread outside the prostate. Stage 3 (III). At this stage, the cancer has spread beyond the outer layer of the prostate to nearby tissues. The cancer may be found in the seminal vesicles, which are near the bladder and the prostate. Stage 4 (IV). At this stage, the cancer has spread to other parts of the body, such as the lymph nodes, bones, bladder, rectum, liver, or lungs. Prostate cancer grading Prostate cancer is also graded according to how the cancer cells look under a microscope. This is called the Gleason score and the total score can range from 6-10, indicating how likely it is that the cancer will spread (metastasize) to other parts of the body. The higher the score, the greater the likelihood that the cancer will spread. Gleason 6 or lower: This indicates that the cancer cells look similar to normal prostate cells (well differentiated). Gleason 7: This indicates that the cancer cells look somewhat similar to normal prostate cells (moderately differentiated). Gleason 8, 9, or 10: This indicates that the cancer cells look very different than normal prostate cells (poorly differentiated). How is this treated? Treatment for this condition depends on several  factors, including the stage of the cancer, your age, personal preferences, and your overall health. Talk with your health care provider about treatment options that are recommended for you. Common treatments include: Observation for early stage prostate cancer (active surveillance). This involves having exams, blood tests, and in some cases, more biopsies.  For some men, this is the only treatment needed. Surgery. Types of surgeries include: Open surgery (radical prostatectomy). In this surgery, a larger incision is made to remove the prostate. A laparoscopic radical prostatectomy. This is a surgery to remove the prostate and lymph nodes through several small incisions. It is often referred to as a minimally invasive surgery. A robotic radical prostatectomy. This is laparoscopic surgery to remove the prostate and lymph nodes with the help of robotic arms that are controlled by the surgeon. Cryoablation. This is surgery to freeze and destroy cancer cells. Radiation treatment. Types of radiation treatment include: External beam radiation. This type aims beams of radiation from outside the body at the prostate to destroy cancerous cells. Brachytherapy. This type uses radioactive needles, seeds, wires, or tubes that are implanted into the prostate gland. Like external beam radiation, brachytherapy destroys cancerous cells. An advantage is that this type of radiation limits the damage to surrounding tissue and has fewer side effects. Chemotherapy. This treatment kills cancer cells or stops them from multiplying. It kills both cancer cells and normal cells. Targeted therapy. This treatment uses medicines to kill cancer cells without damaging normal cells. Hormone treatment. This treatment involves taking medicines that act on testosterone, one of the male hormones, by: Stopping your body from producing testosterone. Blocking testosterone from reaching cancer cells. Follow these instructions at home: Lifestyle Do not use any products that contain nicotine or tobacco. These products include cigarettes, chewing tobacco, and vaping devices, such as e-cigarettes. If you need help quitting, ask your health care provider. Eat a healthy diet. To do this: Eat foods that are high in fiber. These include beans, whole grains, and fresh fruits and vegetables. Limit  foods that are high in fat and sugar. These include fried or sweet foods. Treatment for prostate cancer may affect sexual function. If you have a partner, continue to have intimate moments. This may include touching, holding, hugging, and caressing your partner. Get plenty of sleep. Consider joining a support group for men who have prostate cancer. Meeting with a support group may help you learn to manage the stress of having cancer. General instructions Take over-the-counter and prescription medicines only as told by your health care provider. If you have to go to the hospital, notify your cancer specialist (oncologist). Keep all follow-up visits. This is important. Where to find more information American Cancer Society: www.cancer.[place] of Clinical Oncology: www.cancer.net [employer]: www.cancer.gov Contact a health care provider if: You have new or increasing trouble urinating. You have new or increasing blood in your urine. You have new or increasing pain in your hips, back, or chest. Get help right away if: You have weakness or numbness in your legs. You cannot control urination or your bowel movements (incontinence). You have chills or a fever. Summary The prostate is a small gland that is involved in the production of semen. It is located below a man's bladder, in front of the rectum. Prostate cancer is the abnormal growth of cells in the prostate gland. Treatment for this condition depends on the stage of the cancer, your age, personal preferences, and your overall health. Talk with your health care provider about  treatment options that are recommended for you. Consider joining a support group for men who have prostate cancer. Meeting with a support group may help you learn to manage the stress of having cancer. This information is not intended to replace advice given to you by your health care provider. Make sure you discuss any questions you have with  your health care provider. Document Revised: 01/09/2021 Document Reviewed: 01/09/2021 Elsevier Patient Education  [place].

## 2022-09-22 NOTE — Progress Notes (Signed)
09/22/2022 4:21 PM   [name] 06-29-1976 [id number]  Referring provider: No referring provider defined for this encounter.  Followup BPH   HPI: Mr Blitch is a 46yo here for followup for BPH and nocturia. IPSS 32 QOL 5. Nocturia 5-7x, urine strema weaker. He was doing better on uroxatral and then he started IMRT which made his LUTS worse. He has one more IMRT session.    PMH: Past Medical History:  Diagnosis Date   Anxiety    Arthritis    knees, gets right knee injection q 4 months   Chronic low back pain with left-sided sciatica    Diabetes mellitus without complication (HCC)    diet controlled does not check cbg at home, last hemaglobin a1c was 7  in june 2023 per pt   Hypertension    Prostate cancer [facility])    dx june 2023   Reflux    Sleep apnea    osa sleep study done  06-27-2022 no cpap yet per pt on 07-07-2022   TIA (transient ischemic attack)    1 and 1/2 yrs ago per pt on 07-07-2022  sees pcp dr  s Duke Salvia at [facility]   Wears glasses     Surgical History: Past Surgical History:  Procedure Laterality Date   ACHILLES TENDON SURGERY Right 02/02/2020   Procedure: Right Achilles tendon debridement and reconstruction;  Surgeon: Wylene Simmer, MD;  Location: [place];  Service: Orthopedics;  Laterality: Right;   ACHILLES TENDON SURGERY Right 12/13/2020   Procedure: Right Achilles Tendon Reconstruction;  Surgeon: Wylene Simmer, MD;  Location: [place];  Service: Orthopedics;  Laterality: Right;   CYSTOSCOPY  07/24/2022   Procedure: CYSTOSCOPY;  Surgeon: Cleon Gustin, MD;  Location: [facility];  Service: Urology;;   ELBOW FRACTURE SURGERY Left    yrs ago   GASTRIC FUNDOPLICATION     yrs ago   GASTROC RECESSION EXTREMITY Right 12/13/2020   Procedure: Gastroc Recession;  Surgeon: Wylene Simmer, MD;  Location: [place];  Service: Orthopedics;  Laterality: Right;   RADIOACTIVE SEED IMPLANT N/A  07/24/2022   Procedure: RADIOACTIVE SEED IMPLANT/BRACHYTHERAPY IMPLANT;  Surgeon: Cleon Gustin, MD;  Location: [facility];  Service: Urology;  Laterality: N/A;   right thumb surgery     yrs ago   SPACE OAR INSTILLATION N/A 07/24/2022   Procedure: SPACE OAR INSTILLATION;  Surgeon: Cleon Gustin, MD;  Location: [facility];  Service: Urology;  Laterality: N/A;    Home Medications:  Allergies as of 09/22/2022       Reactions   Contrast Media [iodinated Contrast Media] Nausea And Vomiting, Swelling   Pt. Vomited immediately after IV dye injection.  Pt. Then had throat swelling and edema to uvula.     Acetaminophen    Other reaction(s): heart racing   Vicodin [hydrocodone-acetaminophen] Nausea And Vomiting        Medication List        Accurate as of September 22, 2022  4:21 PM. If you have any questions, ask your nurse or doctor.          abiraterone acetate 250 MG tablet Commonly known as: ZYTIGA Take 1,000 mg by mouth daily. Take on an empty stomach 1 hour before or 2 hours after a meal   alfuzosin 10 MG 24 hr tablet Commonly known as: UROXATRAL Take 1 tablet (10 mg total) by mouth at bedtime.   amLODipine 10 MG tablet  Commonly known as: NORVASC Take 1 tablet (10 mg total) by mouth daily.   cetirizine 10 MG tablet Commonly known as: ZYRTEC TAKE ONE TABLET BY MOUTH DAILY AS NEEDED FOR ALLERGIES   meloxicam 7.5 MG tablet Commonly known as: MOBIC TAKE ONE TABLET BY MOUTH TWICE A DAY AS NEEDED FOR SEVERE PAIN   methocarbamol 500 MG tablet Commonly known as: ROBAXIN methocarbamol 500 mg tablet  Take 1 tablet every 8 hours by oral route as needed for spasm for 10 days.   nicotine 7 mg/24hr patch Commonly known as: NICODERM CQ - dosed in mg/24 hr Not currently using will start before surgery on 04-23-2022   predniSONE 5 MG tablet Commonly known as: DELTASONE Take 5 mg by mouth daily with breakfast.   traMADol 50 MG  tablet Commonly known as: Ultram Take 1 tablet (50 mg total) by mouth every 6 (six) hours as needed.        Allergies:  Allergies  Allergen Reactions   Contrast Media [Iodinated Contrast Media] Nausea And Vomiting and Swelling    Pt. Vomited immediately after IV dye injection.  Pt. Then had throat swelling and edema to uvula.     Acetaminophen     Other reaction(s): heart racing   Vicodin [Hydrocodone-Acetaminophen] Nausea And Vomiting    Family History: Family History  Problem Relation Age of Onset   Prostate cancer Father     Social History:  reports that he quit smoking about 2 years ago. His smoking use included cigarettes and cigars. He has a 26.00 pack-year smoking history. He has never used smokeless tobacco. He reports that he does not currently use alcohol. He reports that he does not use drugs.  ROS: All other review of systems were reviewed and are negative except what is noted above in HPI  Physical Exam: BP (!) 164/98   Pulse (!) 120   Constitutional:  Alert and oriented, No acute distress. HEENT: [place] AT, moist mucus membranes.  Trachea midline, no masses. Cardiovascular: No clubbing, cyanosis, or edema. Respiratory: Normal respiratory effort, no increased work of breathing. GI: Abdomen is soft, nontender, nondistended, no abdominal masses GU: No CVA tenderness.  Lymph: No cervical or inguinal lymphadenopathy. Skin: No rashes, bruises or suspicious lesions. Neurologic: Grossly intact, no focal deficits, moving all 4 extremities. Psychiatric: Normal mood and affect.  Laboratory Data: Lab Results  Component Value Date   WBC 15.5 (H) 05/24/2021   HGB 17.0 07/24/2022   HCT 50.0 07/24/2022   MCV 91.6 05/24/2021   PLT 358 05/24/2021    Lab Results  Component Value Date   CREATININE 0.80 07/24/2022    No results found for: "PSA"  No results found for: "TESTOSTERONE"  Lab Results  Component Value Date   HGBA1C 7.0 (H) 05/24/2021    Urinalysis     Component Value Date/Time   COLORURINE STRAW (A) 05/24/2021 0303   APPEARANCEUR Hazy (A) 08/13/2022 1535   LABSPEC 1.029 05/24/2021 0303   PHURINE 7.0 05/24/2021 0303   GLUCOSEU Negative 08/13/2022 1535   HGBUR NEGATIVE 05/24/2021 0303   BILIRUBINUR Negative 08/13/2022 1535   KETONESUR NEGATIVE 05/24/2021 0303   PROTEINUR 1+ (A) 08/13/2022 1535   PROTEINUR NEGATIVE 05/24/2021 0303   NITRITE Negative 08/13/2022 1535   NITRITE NEGATIVE 05/24/2021 0303   LEUKOCYTESUR Trace (A) 08/13/2022 1535   LEUKOCYTESUR NEGATIVE 05/24/2021 0303    Lab Results  Component Value Date   LABMICR See below: 08/13/2022   WBCUA 0-5 08/13/2022   LABEPIT None seen 08/13/2022  BACTERIA None seen 08/13/2022    Pertinent Imaging:  No results found for this or any previous visit.  No results found for this or any previous visit.  No results found for this or any previous visit.  No results found for this or any previous visit.  No results found for this or any previous visit.  No valid procedures specified. No results found for this or any previous visit.  No results found for this or any previous visit.   Assessment & Plan:    1. Benign prostatic hyperplasia with urinary obstruction -Rapaflo '8mg'$  qhs - Urinalysis, Routine w reflex microscopic  2. Nocturia -Rapaflo '8mg'$  qhs  3. Prostate cancer Followup 3 months with PSA   No follow-ups on file.  Nicolette Bang, MD  [facility] [place]

## 2022-09-23 ENCOUNTER — Other Ambulatory Visit: Payer: Self-pay

## 2022-09-23 ENCOUNTER — Ambulatory Visit: Payer: No Typology Code available for payment source

## 2022-09-23 ENCOUNTER — Ambulatory Visit
Admission: RE | Admit: 2022-09-23 | Discharge: 2022-09-23 | Disposition: A | Discharge status: Home / Self Care | Payer: No Typology Code available for payment source | Source: Ambulatory Visit | Attending: Radiation Oncology | Admitting: Radiation Oncology

## 2022-09-23 ENCOUNTER — Encounter: Payer: Self-pay | Admitting: Urology

## 2022-09-23 DIAGNOSIS — Z510 Encounter for antineoplastic radiation therapy: Secondary | ICD-10-CM | POA: Diagnosis not present

## 2022-09-23 LAB — RAD ONC ARIA SESSION SUMMARY
Course Elapsed Days: 35
Plan Fractions Treated to Date: 25
Plan Prescribed Dose Per Fraction: 1.8 Gy
Plan Total Fractions Prescribed: 25
Plan Total Prescribed Dose: 45 Gy
Reference Point Dosage Given to Date: 45 Gy
Reference Point Session Dosage Given: 1.8 Gy
Session Number: 25

## 2022-09-23 LAB — URINALYSIS, ROUTINE W REFLEX MICROSCOPIC
Bilirubin, UA: NEGATIVE
Glucose, UA: NEGATIVE
Ketones, UA: NEGATIVE
Leukocytes,UA: NEGATIVE
Nitrite, UA: POSITIVE — AB
Protein,UA: NEGATIVE
RBC, UA: NEGATIVE
Specific Gravity, UA: 1.015 (ref 1.005–1.030)
Urobilinogen, Ur: 1 mg/dL (ref 0.2–1.0)
pH, UA: 6.5 (ref 5.0–7.5)

## 2022-09-23 LAB — MICROSCOPIC EXAMINATION
Bacteria, UA: NONE SEEN
RBC, Urine: NONE SEEN /hpf (ref 0–2)

## 2022-09-24 ENCOUNTER — Ambulatory Visit: Payer: No Typology Code available for payment source

## 2022-09-30 ENCOUNTER — Encounter: Payer: Self-pay | Admitting: Urology

## 2022-10-03 NOTE — Progress Notes (Signed)
                                                                                                                                                             Patient Name: Mark Anthony MRN: [id number] DOB: 15-Apr-1976 Referring Physician: Collier Bullock Date of Service: 09/23/2022 [facility],                                                         End Of Treatment Note  Diagnoses: C61-Malignant neoplasm of prostate  Cancer Staging: 46 y.o. gentleman with locally advanced prostate cancer with Gleason score of 4+5, and PSA of 16.4   Intent: Curative  Radiation Treatment Dates:  08/19/2022 through 09/23/2022 to supplement an up-front prostate seed implant boost of 110 Gy to achieve a total nominal dose of 155 Gy.  Site Technique Total Dose (Gy) Dose per Fx (Gy) Completed Fx Beam Energies  Prostate: Prostate_Pelvis IMRT 45/45 1.8 25/25 6X   07/24/22:  Insertion of radioactive I-125 seeds into the prostate gland;110 Gy, boost therapy. Narrative: The patient tolerated radiation therapy relatively well with only minor urinary symptoms and modest fatigue.  He did report increased nocturia despite taking Uroxatrol daily as prescribed.  Plan: The patient will receive a call in about one month from the radiation oncology department. He will continue follow up with Dr. Felipa Eth in urology as well.   Nicholos Johns, PA-C    Tyler Pita, MD  [facility] Direct Dial: [phone]  Fax: [phone] [email or web address]  Skype  LinkedIn

## 2022-10-21 ENCOUNTER — Ambulatory Visit
Admission: RE | Admit: 2022-10-21 | Discharge: 2022-10-21 | Disposition: A | Discharge status: Home / Self Care | Payer: No Typology Code available for payment source | Source: Ambulatory Visit | Attending: Radiation Oncology | Admitting: Radiation Oncology

## 2022-10-21 DIAGNOSIS — C61 Malignant neoplasm of prostate: Secondary | ICD-10-CM | POA: Insufficient documentation

## 2022-10-21 DIAGNOSIS — Z510 Encounter for antineoplastic radiation therapy: Secondary | ICD-10-CM | POA: Insufficient documentation

## 2022-10-21 NOTE — Progress Notes (Signed)
  Radiation Oncology         [phone] ________________________________  Name: Mark Anthony MRN: [id number]  Date of Service: 10/21/2022  DOB: [birth year]  Post Treatment Telephone Note  Diagnosis:  46 y.o. gentleman with locally advanced prostate cancer with Gleason score of 4+5, and PSA of 16.4   Intent: Curative  Radiation Treatment Dates:  08/19/2022 through 09/23/2022 to supplement an up-front prostate seed implant boost of 110 Gy to achieve a total nominal dose of 155 Gy.  Site Technique Total Dose (Gy) Dose per Fx (Gy) Completed Fx Beam Energies  Prostate: Prostate_Pelvis IMRT 45/45 1.8 25/25 6X  (as documented in provider EOT note)   The patient was not available for call today. A voicemail was left.   Patient does not currently have any scheduled follow up with his urologist to his knowledge so he was advised to call [facility] to schedule his post-treatment follow up with Dr. Felipa Eth for ongoing surveillance. He was counseled that PSA levels will be drawn in the urology office, and was reassured that additional time is expected to improve bowel and bladder symptoms. He was encouraged to call back with concerns or questions regarding radiation.   Leandra Kern, LPN

## 2022-10-22 ENCOUNTER — Ambulatory Visit: Admission: RE | Admit: 2022-10-22 | Payer: Non-veteran care | Source: Ambulatory Visit | Admitting: Urology

## 2022-10-22 ENCOUNTER — Ambulatory Visit: Payer: Self-pay | Admitting: Urology

## 2022-11-21 ENCOUNTER — Ambulatory Visit (INDEPENDENT_AMBULATORY_CARE_PROVIDER_SITE_OTHER): Payer: No Typology Code available for payment source | Admitting: Urology

## 2022-11-21 ENCOUNTER — Encounter: Payer: Self-pay | Admitting: Urology

## 2022-11-21 VITALS — BP 116/74 | HR 102

## 2022-11-21 DIAGNOSIS — N138 Other obstructive and reflux uropathy: Secondary | ICD-10-CM | POA: Diagnosis not present

## 2022-11-21 DIAGNOSIS — R351 Nocturia: Secondary | ICD-10-CM

## 2022-11-21 DIAGNOSIS — C61 Malignant neoplasm of prostate: Secondary | ICD-10-CM

## 2022-11-21 DIAGNOSIS — N401 Enlarged prostate with lower urinary tract symptoms: Secondary | ICD-10-CM | POA: Diagnosis not present

## 2022-11-21 NOTE — Patient Instructions (Signed)

## 2022-11-21 NOTE — Progress Notes (Unsigned)
11/21/2022 12:02 PM   Mark Anthony 25-Jun-1976 [id number]  Referring provider: No referring provider defined for this encounter.  No chief complaint on file.   HPI: Mr Mark Anthony is a 47yo here for folowup for prostate cancer and BPH. He had a PSA last week at the [place] and was given ADT at the [place]. IPSS 12 QOL 2 [facility] fl[facility]. [facility]paflo '8mg'$ . His LUTS are improving since finishing IMRT. Burning with urination is improving.     PMH: Past Medical History:  Diagnosis Date   Anxiety    Arthritis    knees, gets right knee injection q 4 months   Chronic low back pain with left-sided sciatica    Diabetes mellitus without complication (HCC)    diet controlled does not check cbg at home, last hemaglobin a1c was 7  in june 2023 per pt   Hypertension    Prostate cancer [facility])    dx june 2023   Reflux    Sleep apnea    osa sleep study done  06-27-2022 no cpap yet per pt on 07-07-2022   TIA (transient ischemic attack)    1 and 1/2 yrs ago per pt on 07-07-2022  sees pcp dr  s Duke Salvia at [facility]   Wears glasses     Surgical History: Past Surgical History:  Procedure Laterality Date   ACHILLES TENDON SURGERY Right 02/02/2020   Procedure: Right Achilles tendon debridement and reconstruction;  Surgeon: Wylene Simmer, MD;  Location: [place];  Service: Orthopedics;  Laterality: Right;   ACHILLES TENDON SURGERY Right 12/13/2020   Procedure: Right Achilles Tendon Reconstruction;  Surgeon: Wylene Simmer, MD;  Location: [place];  Service: Orthopedics;  Laterality: Right;   CYSTOSCOPY  07/24/2022   Procedure: CYSTOSCOPY;  Surgeon: Cleon Gustin, MD;  Location: [facility];  Service: Urology;;   ELBOW FRACTURE SURGERY Left    yrs ago   GASTRIC FUNDOPLICATION     yrs ago   GASTROC RECESSION EXTREMITY Right 12/13/2020   Procedure: Gastroc Recession;  Surgeon: Wylene Simmer, MD;  Location: [place];   Service: Orthopedics;  Laterality: Right;   RADIOACTIVE SEED IMPLANT N/A 07/24/2022   Procedure: RADIOACTIVE SEED IMPLANT/BRACHYTHERAPY IMPLANT;  Surgeon: Cleon Gustin, MD;  Location[facility];  Service: Urology;  Laterality: N/A;   right thumb surgery     yrs ago   SPACE OAR INSTILLATION N/A 07/24/2022   Procedure: SPACE OAR INSTILLATION;  Surgeon: Cleon Gustin, MD;  Location[facility];  Service: Urology;  Laterality: N/A;    Home Medications:  Allergies as of 11/21/2022       Reactions   Contrast Media [iodinated Contrast Media] Nausea And Vomiting, Swelling   Pt. Vomited immediately after IV dye injection.  Pt. Then had throat swelling and edema to uvula.     Acetaminophen    Other reaction(s): heart racing   Vicodin [hydrocodone-acetaminophen] Nausea And Vomiting        Medication List        Accurate as of November 21, 2022 12:02 PM. If you have any questions, ask your nurse or doctor.          abiraterone acetate 250 MG tablet Commonly known as: ZYTIGA Take 1,000 mg by mouth daily. Take on an empty stomach 1 hour before or 2 hours after a meal   alfuzosin 10 MG 24 hr tablet Commonly known as: UROXATRAL  Take 1 tablet (10 mg total) by mouth at bedtime.   amLODipine 10 MG tablet Commonly known as: NORVASC Take 1 tablet (10 mg total) by mouth daily.   cetirizine 10 MG tablet Commonly known as: ZYRTEC TAKE ONE TABLET BY MOUTH DAILY AS NEEDED FOR ALLERGIES   meloxicam 7.5 MG tablet Commonly known as: MOBIC TAKE ONE TABLET BY MOUTH TWICE A DAY AS NEEDED FOR SEVERE PAIN   methocarbamol 500 MG tablet Commonly known as: ROBAXIN methocarbamol 500 mg tablet  Take 1 tablet every 8 hours by oral route as needed for spasm for 10 days.   nicotine 7 mg/24hr patch Commonly known as: NICODERM CQ - dosed in mg/24 hr Not currently using will start before surgery on 04-23-2022   predniSONE 5 MG tablet Commonly known as:  DELTASONE Take 5 mg by mouth daily with breakfast.   silodosin 8 MG Caps capsule Commonly known as: RAPAFLO Take 1 capsule (8 mg total) by mouth daily with breakfast.   traMADol 50 MG tablet Commonly known as: Ultram Take 1 tablet (50 mg total) by mouth every 6 (six) hours as needed.        Allergies:  Allergies  Allergen Reactions   Contrast Media [Iodinated Contrast Media] Nausea And Vomiting and Swelling    Pt. Vomited immediately after IV dye injection.  Pt. Then had throat swelling and edema to uvula.     Acetaminophen     Other reaction(s): heart racing   Vicodin [Hydrocodone-Acetaminophen] Nausea And Vomiting    Family History: Family History  Problem Relation Age of Onset   Prostate cancer Father     Social History:  reports that he quit smoking about 3 years ago. His smoking use included cigarettes and cigars. He has a 26.00 pack-year smoking history. He has never used smokeless tobacco. He reports that he does not currently use alcohol. He reports that he does not use drugs.  ROS: All other review of systems were reviewed and are negative except what is noted above in HPI  Physical Exam: BP 116/74   Pulse (!) 102   Constitutional:  Alert and oriented, No acute distress. HEENT: [place] AT, moist mucus membranes.  Trachea midline, no masses. Cardiovascular: No clubbing, cyanosis, or edema. Respiratory: Normal respiratory effort, no increased work of breathing. GI: Abdomen is soft, nontender, nondistended, no abdominal masses GU: No CVA tenderness.  Lymph: No cervical or inguinal lymphadenopathy. Skin: No rashes, bruises or suspicious lesions. Neurologic: Grossly intact, no focal deficits, moving all 4 extremities. Psychiatric: Normal mood and affect.  Laboratory Data: Lab Results  Component Value Date   WBC 15.5 (H) 05/24/2021   HGB 17.0 07/24/2022   HCT 50.0 07/24/2022   MCV 91.6 05/24/2021   PLT 358 05/24/2021    Lab Results  Component Value Date    CREATININE 0.80 07/24/2022    No results found for: "PSA"  No results found for: "TESTOSTERONE"  Lab Results  Component Value Date   HGBA1C 7.0 (H) 05/24/2021    Urinalysis    Component Value Date/Time   COLORURINE STRAW (A) 05/24/2021 0303   APPEARANCEUR Clear 09/22/2022 1605   LABSPEC 1.029 05/24/2021 0303   PHURINE 7.0 05/24/2021 0303   GLUCOSEU Negative 09/22/2022 1605   HGBUR NEGATIVE 05/24/2021 0303   BILIRUBINUR Negative 09/22/2022 1605   KETONESUR NEGATIVE 05/24/2021 0303   PROTEINUR Negative 09/22/2022 1605   PROTEINUR NEGATIVE 05/24/2021 0303   NITRITE Positive (A) 09/22/2022 1605   NITRITE NEGATIVE 05/24/2021 0303   LEUKOCYTESUR  Negative 09/22/2022 1605   LEUKOCYTESUR NEGATIVE 05/24/2021 0303    Lab Results  Component Value Date   LABMICR See below: 09/22/2022   WBCUA 0-5 09/22/2022   LABEPIT 0-10 09/22/2022   BACTERIA None seen 09/22/2022    Pertinent Imaging: *** No results found for this or any previous visit.  No results found for this or any previous visit.  No results found for this or any previous visit.  No results found for this or any previous visit.  No results found for this or any previous visit.  No valid procedures specified. No results found for this or any previous visit.  No results found for this or any previous visit.   Assessment & Plan:    1. Prostate cancer ([place]) ***  2. Benign prostatic hyperplasia with urinary obstruction ***  3. Nocturia ***   No follow-ups on file.  Nicolette Bang, MD  [facility] [place]

## 2022-12-03 ENCOUNTER — Telehealth: Payer: Self-pay

## 2022-12-03 NOTE — Telephone Encounter (Signed)
Patient dropped paperwork off for Dr. Alyson Ingles to sign. Per note from patient- paperwork needed to be completed for 12/04/2022  I called and left a voicemail for patient stating Dr. Alyson Ingles is on vacation this week and will not return until 12/08/2022. Patient deadline request is unable to honor due to MD out of office. We will have Dr. Alyson Ingles review the paperwork upon his return.

## 2023-02-23 ENCOUNTER — Other Ambulatory Visit: Payer: Non-veteran care

## 2023-02-23 DIAGNOSIS — C61 Malignant neoplasm of prostate: Secondary | ICD-10-CM

## 2023-02-24 ENCOUNTER — Ambulatory Visit
Admission: EM | Admit: 2023-02-24 | Discharge: 2023-02-24 | Disposition: A | Discharge status: Home / Self Care | Payer: Non-veteran care | Attending: Family Medicine | Admitting: Family Medicine

## 2023-02-24 DIAGNOSIS — J029 Acute pharyngitis, unspecified: Secondary | ICD-10-CM | POA: Diagnosis not present

## 2023-02-24 LAB — PSA: Prostate Specific Ag, Serum: <0.1 ng/mL (ref 0.0–4.0)

## 2023-02-24 MED ORDER — PREDNISONE 10 MG (21) PO TBPK: ORAL_TABLET | Freq: Every day | ORAL | 0 refills | Status: DC

## 2023-02-24 MED ORDER — LIDOCAINE VISCOUS HCL 2 % MT SOLN: 5.0000 mL | Freq: Four times a day (QID) | OROMUCOSAL | 0 refills | Status: DC | PRN

## 2023-02-24 NOTE — ED Triage Notes (Signed)
Pt presents with sore throat and post nasal congestion after completing Z pack for sinus/URI over a week ago.

## 2023-02-24 NOTE — Discharge Instructions (Signed)
You may use over the counter ibuprofen or acetaminophen as needed.  °For a sore throat, over the counter products such as [employer] Peroxyl Mouth Sore Rinse or Chloraseptic Sore Throat Spray may provide some temporary relief. ° ° ° ° °

## 2023-02-25 NOTE — ED Provider Notes (Signed)
[facility]   [id number] 02/24/23 Arrival Time: 1848  ASSESSMENT & PLAN:  1. Sore throat    No signs of peritonsillar abscess. Discussed. Finished ZPAK sev days ago. Do not suspect bacterial infection.  Begin trial of: Meds ordered this encounter  Medications   predniSONE (STERAPRED UNI-PAK 21 TAB) 10 MG (21) TBPK tablet    Sig: Take by mouth daily. Take as directed.    Dispense:  21 tablet    Refill:  0   magic mouthwash (lidocaine, diphenhydrAMINE, alum & mag hydroxide) suspension    Sig: Swish and spit 5 mLs 4 (four) times daily as needed for mouth pain.    Dispense:  360 mL    Refill:  0      Discharge Instructions      You may use over the counter ibuprofen or acetaminophen as needed.  For a sore throat, over the counter products such as [employer] Peroxyl Mouth Sore Rinse or Chloraseptic Sore Throat Spray may provide some temporary relief.      Reviewed expectations re: course of current medical issues. Questions answered. Outlined signs and symptoms indicating need for more acute intervention. Patient verbalized understanding. After Visit Summary given.   SUBJECTIVE:  Mark Anthony is a 47 y.o. male who reports a sore throat/nasal congestion/post-nasal drip; few days. Denies fever. Tolerating PO intake but with painful swallowing. Feels "a ton of mucous that just sits in my throat" for the past few days.  OBJECTIVE:  Vitals:   02/24/23 1946  BP: (!) 140/84  Pulse: (!) 110  Resp: 18  Temp: 98.6 F (37 C)  TempSrc: Oral  SpO2: 94%    Tachycardia noted. General appearance: alert; no distress HEENT: throat with marked erythema and cobblestoning; uvula is midline Neck: supple with FROM; no lymphadenopathy Lungs: speaks full sentences without difficulty; unlabored Abd: soft; non-tender Skin: reveals no rash; warm and dry Psychological: alert and cooperative; normal mood and affect  Allergies  Allergen Reactions   Contrast Media [Iodinated  Contrast Media] Nausea And Vomiting and Swelling    Pt. Vomited immediately after IV dye injection.  Pt. Then had throat swelling and edema to uvula.     Acetaminophen     Other reaction(s): heart racing   Vicodin [Hydrocodone-Acetaminophen] Nausea And Vomiting    Past Medical History:  Diagnosis Date   Anxiety    Arthritis    knees, gets right knee injection q 4 months   Chronic low back pain with left-sided sciatica    Diabetes mellitus without complication (HCC)    diet controlled does not check cbg at home, last hemaglobin a1c was 7  in june 2023 per pt   Hypertension    Prostate cancer [facility])    dx june 2023   Reflux    Sleep apnea    osa sleep study done  06-27-2022 no cpap yet per pt on 07-07-2022   TIA (transient ischemic attack)    1 and 1/2 yrs ago per pt on 07-07-2022  sees pcp dr  s Newt Lukes at [facility]   Wears glasses    Social History   Socioeconomic History   Marital status: Single    Spouse name: Not on file   Number of children: Not on file   Years of education: Not on file   Highest education level: Not on file  Occupational History   Not on file  Tobacco Use   Smoking status: Former    Packs/day: 1.00    Years: 26.00  Additional pack years: 0.00    Total pack years: 26.00    Types: Cigarettes, Cigars    Quit date: 2021    Years since quitting: 3.3   Smokeless tobacco: Never   Tobacco comments:    Smokes 2 to 3 cigars per day now per pt on 07-07-2022  Vaping Use   Vaping Use: Never used  Substance and Sexual Activity   Alcohol use: Not Currently   Drug use: No   Sexual activity: Not on file  Other Topics Concern   Not on file  Social History Narrative   Not on file   Social Determinants of Health   Financial Resource Strain: Not on file  Food Insecurity: Not on file  Transportation Needs: Not on file  Physical Activity: Not on file  Stress: Not on file  Social Connections: Not on file  Intimate Partner Violence: Not on file    Family History  Problem Relation Age of Onset   Prostate cancer Father            Mardella Layman, MD 02/25/23 [phone]

## 2023-03-04 ENCOUNTER — Ambulatory Visit: Payer: Non-veteran care | Admitting: Urology

## 2023-04-04 ENCOUNTER — Other Ambulatory Visit: Payer: Self-pay

## 2023-04-04 ENCOUNTER — Emergency Department (HOSPITAL_COMMUNITY)
Admission: EM | Admit: 2023-04-04 | Discharge: 2023-04-04 | Disposition: A | Discharge status: Home / Self Care | Payer: No Typology Code available for payment source | Attending: Emergency Medicine | Admitting: Emergency Medicine

## 2023-04-04 ENCOUNTER — Encounter (HOSPITAL_COMMUNITY): Payer: Self-pay | Admitting: *Deleted

## 2023-04-04 DIAGNOSIS — L03115 Cellulitis of right lower limb: Secondary | ICD-10-CM | POA: Insufficient documentation

## 2023-04-04 DIAGNOSIS — Z79899 Other long term (current) drug therapy: Secondary | ICD-10-CM | POA: Insufficient documentation

## 2023-04-04 DIAGNOSIS — E119 Type 2 diabetes mellitus without complications: Secondary | ICD-10-CM | POA: Diagnosis not present

## 2023-04-04 DIAGNOSIS — L02415 Cutaneous abscess of right lower limb: Secondary | ICD-10-CM

## 2023-04-04 DIAGNOSIS — I10 Essential (primary) hypertension: Secondary | ICD-10-CM | POA: Insufficient documentation

## 2023-04-04 DIAGNOSIS — M79651 Pain in right thigh: Secondary | ICD-10-CM | POA: Diagnosis present

## 2023-04-04 MED ORDER — DOXYCYCLINE HYCLATE 100 MG PO TABS
100.0000 mg | ORAL_TABLET | Freq: Once | ORAL | Status: AC
  Administered 2023-04-04: 100 mg via ORAL
  Filled 2023-04-04: qty 1

## 2023-04-04 MED ORDER — LIDOCAINE-EPINEPHRINE (PF) 2 %-1:200000 IJ SOLN
10.0000 mL | Freq: Once | INTRAMUSCULAR | Status: AC
  Administered 2023-04-04: 10 mL
  Filled 2023-04-04: qty 20

## 2023-04-04 MED ORDER — ONDANSETRON HCL 4 MG/2ML IJ SOLN
4.0000 mg | Freq: Once | INTRAMUSCULAR | Status: DC
  Filled 2023-04-04: qty 2

## 2023-04-04 MED ORDER — DOXYCYCLINE HYCLATE 100 MG PO CAPS: 100.0000 mg | ORAL_CAPSULE | Freq: Two times a day (BID) | ORAL | 0 refills | Status: AC

## 2023-04-04 MED ORDER — ONDANSETRON 4 MG PO TBDP
4.0000 mg | ORAL_TABLET | Freq: Once | ORAL | Status: AC
  Administered 2023-04-04: 4 mg via ORAL
  Filled 2023-04-04: qty 1

## 2023-04-04 MED ORDER — OXYCODONE HCL 5 MG PO TABS
5.0000 mg | ORAL_TABLET | Freq: Once | ORAL | Status: AC
  Administered 2023-04-04: 5 mg via ORAL
  Filled 2023-04-04: qty 1

## 2023-04-04 NOTE — ED Triage Notes (Addendum)
Pt states with several ingrown hair to right groin area.

## 2023-04-04 NOTE — ED Provider Notes (Cosign Needed Addendum)
[place] EMERGENCY DEPARTMENT AT [facility] Provider Note   CSN: [id number] Arrival date & time: 04/04/23  1543     History  Chief Complaint  Patient presents with   ingrown hair    Mark Anthony is a 47 y.o. male.  He has PMH of diabetes, hypertension, presents the ER complaining of right medial thigh, states he always have a wants for due to ingrown hairs.  It has been getting larger over the past several days, denies fever or chills, reports pain to the area, no pain or swelling to his perineal area or testicles.  No nausea or vomiting.  HPI     Home Medications Prior to Admission medications   Medication Sig Start Date End Date Taking? Authorizing Provider  doxycycline (VIBRAMYCIN) 100 MG capsule Take 1 capsule (100 mg total) by mouth 2 (two) times daily for 7 days. 04/04/23 04/11/23 Yes Masiah Lewing A, PA-C  abiraterone acetate (ZYTIGA) 250 MG tablet Take 1,000 mg by mouth daily. Take on an empty stomach 1 hour before or 2 hours after a meal    [provider]  alfuzosin (UROXATRAL) 10 MG 24 hr tablet Take 1 tablet (10 mg total) by mouth at bedtime. 08/13/22   McKenzie, Mardene Josefina Rynders, MD  amLODipine (NORVASC) 10 MG tablet Take 1 tablet (10 mg total) by mouth daily. 05/24/21 07/24/22  Shon Hale, MD  cetirizine (ZYRTEC) 10 MG tablet TAKE ONE TABLET BY MOUTH DAILY AS NEEDED FOR ALLERGIES 02/27/22   [provider]  magic mouthwash (lidocaine, diphenhydrAMINE, alum & mag hydroxide) suspension Swish and spit 5 mLs 4 (four) times daily as needed for mouth pain. 02/24/23   Mardella Layman, MD  meloxicam (MOBIC) 7.5 MG tablet TAKE ONE TABLET BY MOUTH TWICE A DAY AS NEEDED FOR SEVERE PAIN 02/19/22   [provider]  methocarbamol (ROBAXIN) 500 MG tablet methocarbamol 500 mg tablet  Take 1 tablet every 8 hours by oral route as needed for spasm for 10 days.    [provider]  nicotine (NICODERM CQ - DOSED IN MG/24 HR) 7 mg/24hr patch Not currently  using will start before surgery on 04-23-2022 04/03/22   [provider]  predniSONE (STERAPRED UNI-PAK 21 TAB) 10 MG (21) TBPK tablet Take by mouth daily. Take as directed. 02/24/23   Mardella Layman, MD  silodosin (RAPAFLO) 8 MG CAPS capsule Take 1 capsule (8 mg total) by mouth daily with breakfast. 09/22/22   McKenzie, Mardene Nayvie Lips, MD  traMADol (ULTRAM) 50 MG tablet Take 1 tablet (50 mg total) by mouth every 6 (six) hours as needed. 07/24/22 07/24/23  Malen Gauze, MD      Allergies    Contrast media [iodinated contrast media], Acetaminophen, and Vicodin [hydrocodone-acetaminophen]    Review of Systems   Review of Systems  Physical Exam Updated Vital Signs BP (!) 152/96 (BP Location: Right Arm)   Pulse 96   Temp 98.7 F (37.1 C) (Temporal)   Resp 16   Ht 6\' 1"  (1.854 m)   Wt 113.4 kg   SpO2 99%   BMI 32.98 kg/m  Physical Exam Vitals and nursing note reviewed.  Constitutional:      General: He is not in acute distress.    Appearance: He is well-developed.  HENT:     Head: Normocephalic and atraumatic.  Eyes:     Conjunctiva/sclera: Conjunctivae normal.  Cardiovascular:     Rate and Rhythm: Normal rate and regular rhythm.     Heart sounds: No  murmur heard. Pulmonary:     Effort: Pulmonary effort is normal. No respiratory distress.     Breath sounds: Normal breath sounds.  Abdominal:     Palpations: Abdomen is soft.     Tenderness: There is no abdominal tenderness.  Musculoskeletal:        General: No swelling. Normal range of motion.     Cervical back: Neck supple.  Skin:    General: Skin is warm and dry.     Capillary Refill: Capillary refill takes less than 2 seconds.     Comments: Swelling 3 cm diameter area of fluctuance to right medial thigh with an additional 2 to 3 cm surrounding of induration with overlying erythema.  No crepitation.  Neurological:     General: No focal deficit present.     Mental Status: He is alert and oriented to person, place, and  time.  Psychiatric:        Mood and Affect: Mood normal.     ED Results / Procedures / Treatments   Labs (all labs ordered are listed, but only abnormal results are displayed) Labs Reviewed - No data to display  EKG None  Radiology No results found.  Procedures .Marland KitchenIncision and Drainage  Date/Time: 04/04/2023 6:18 PM  Performed by: Ma Rings, PA-C Authorized by: Ma Rings, PA-C   Consent:    Consent obtained:  Verbal   Consent given by:  Patient   Risks discussed:  Bleeding, pain and incomplete drainage   Alternatives discussed:  No treatment Universal protocol:    Patient identity confirmed:  Verbally with patient Location:    Type:  Abscess   Location:  Lower extremity   Lower extremity location:  Leg   Leg location:  R upper leg Pre-procedure details:    Skin preparation:  Povidone-iodine Sedation:    Sedation type:  None Anesthesia:    Anesthesia method:  Local infiltration   Local anesthetic:  Lidocaine 2% WITH epi Procedure type:    Complexity:  Complex Procedure details:    Ultrasound guidance: no     Incision types:  Stab incision   Incision depth:  Dermal   Wound management:  Probed and deloculated   Drainage:  Bloody and purulent   Drainage amount:  Moderate   Packing materials:  1/2 in iodoform gauze   Amount 1/2" iodoform:  2 inches Post-procedure details:    Procedure completion:  Tolerated     Medications Ordered in ED Medications  lidocaine-EPINEPHrine (XYLOCAINE W/EPI) 2 %-1:200000 (PF) injection 10 mL (has no administration in time range)  oxyCODONE (Oxy IR/ROXICODONE) immediate release tablet 5 mg (5 mg Oral Given 04/04/23 1738)  doxycycline (VIBRA-TABS) tablet 100 mg (100 mg Oral Given 04/04/23 1738)  ondansetron (ZOFRAN-ODT) disintegrating tablet 4 mg (4 mg Oral Given 04/04/23 1738)    ED Course/ Medical Decision Making/ A&P                             Medical Decision Making Differential diagnosis: Abscess, cellulitis,  sebaceous cyst, lipoma, Fournier's gangrene, other ED course: Patient is with right medial thigh abscess, there is no perianal or testicular involvement, no crepitus, patient is nontoxic with no systemic symptoms, is not febrile tachycardic in the ED and he is well-appearing.  Do not suspect Fournier's gangrene or any necrotizing infection otherwise.  Incision and drainage was tolerated, patient has improvement of pain after the procedure.  He is given prescription for doxycycline, advised  on follow-up in 2 days for packing removal and recheck.  He plans to go to the [place] for this.  His lack of systemic symptoms do not feel any labs are needed, no indication close extending deeper, no indication for imaging at this time.  Risk Prescription drug management.           Final Clinical Impression(s) / ED Diagnoses Final diagnoses:  Abscess of right thigh    Rx / DC Orders ED Discharge Orders          Ordered    doxycycline (VIBRAMYCIN) 100 MG capsule  2 times daily        04/04/23 1815              Ma Rings, PA-C 04/04/23 1818    Ma Rings, PA-C 04/04/23 1821    Bethann Berkshire, MD 04/07/23 1248

## 2023-04-04 NOTE — Discharge Instructions (Signed)
It was a pleasure taking care of you today.  You were seen for an abscess to your right inner thigh.  We did an incision and drainage and started you on antibiotics.  Keep the area clean and dry, change the dressing at least once a day or more frequently if needed.  Take Tylenol Motrin as needed for pain.  Come back to the ER if you have increasing pain, increasing redness, develop a fever or have any other new or worsening symptoms.

## 2023-04-20 ENCOUNTER — Encounter: Payer: Self-pay | Admitting: Urology

## 2023-04-20 ENCOUNTER — Ambulatory Visit (INDEPENDENT_AMBULATORY_CARE_PROVIDER_SITE_OTHER): Payer: Self-pay | Admitting: Urology

## 2023-04-20 VITALS — BP 145/82 | HR 87

## 2023-04-20 DIAGNOSIS — R351 Nocturia: Secondary | ICD-10-CM

## 2023-04-20 DIAGNOSIS — N138 Other obstructive and reflux uropathy: Secondary | ICD-10-CM

## 2023-04-20 DIAGNOSIS — N401 Enlarged prostate with lower urinary tract symptoms: Secondary | ICD-10-CM

## 2023-04-20 DIAGNOSIS — C61 Malignant neoplasm of prostate: Secondary | ICD-10-CM

## 2023-04-20 LAB — MICROSCOPIC EXAMINATION: Bacteria, UA: NONE SEEN

## 2023-04-20 LAB — URINALYSIS, ROUTINE W REFLEX MICROSCOPIC
Bilirubin, UA: NEGATIVE
Ketones, UA: NEGATIVE
Leukocytes,UA: NEGATIVE
Nitrite, UA: NEGATIVE
Protein,UA: NEGATIVE
Specific Gravity, UA: 1.01 (ref 1.005–1.030)
Urobilinogen, Ur: 1 mg/dL (ref 0.2–1.0)
pH, UA: 6 (ref 5.0–7.5)

## 2023-04-20 NOTE — Progress Notes (Signed)
04/20/2023 1:11 PM   Mark Anthony 03/31/1976 [id number]  Referring provider: No referring provider defined for this encounter.  Followup Prostate cancer and BPH   HPI: Mr Mark Anthony is a 47yo here for followup for prostate cancer and BPH. Last PSA 01/2023 was undetectable. He is due for ADT this week. IPSS 16 QOL 3 on flomax 0.4mg , finasteride 5mg  and oxybutynin 5mg  BID. Urine stream strong. NO dysuria. Nocturia 4-5x which are large volume voids. He denies urinary urgency and urinary frequency when on oxybutynin.    PMH: Past Medical History:  Diagnosis Date   Anxiety    Arthritis    knees, gets right knee injection q 4 months   Chronic low back pain with left-sided sciatica    Diabetes mellitus without complication (HCC)    diet controlled does not check cbg at home, last hemaglobin a1c was 7  in june 2023 per pt   Hypertension    Prostate cancer [facility])    dx june 2023   Reflux    Sleep apnea    osa sleep study done  06-27-2022 no cpap yet per pt on 07-07-2022   TIA (transient ischemic attack)    1 and 1/2 yrs ago per pt on 07-07-2022  sees pcp dr  s Mark Anthony at [facility]   Wears glasses     Surgical History: Past Surgical History:  Procedure Laterality Date   ACHILLES TENDON SURGERY Right 02/02/2020   Procedure: Right Achilles tendon debridement and reconstruction;  Surgeon: Mark Arthurs, MD;  Location: [facility];  Service: Orthopedics;  Laterality: Right;   ACHILLES TENDON SURGERY Right 12/13/2020   Procedure: Right Achilles Tendon Reconstruction;  Surgeon: Mark Arthurs, MD;  Location: [facility];  Service: Orthopedics;  Laterality: Right;   CYSTOSCOPY  07/24/2022   Procedure: CYSTOSCOPY;  Surgeon: Mark Gauze, MD;  Location: [facility];  Service: Urology;;   ELBOW FRACTURE SURGERY Left    yrs ago   GASTRIC FUNDOPLICATION     yrs ago   GASTROC RECESSION EXTREMITY Right 12/13/2020   Procedure: Gastroc Recession;   Surgeon: Mark Arthurs, MD;  Location: [facility];  Service: Orthopedics;  Laterality: Right;   RADIOACTIVE SEED IMPLANT N/A 07/24/2022   Procedure: RADIOACTIVE SEED IMPLANT/BRACHYTHERAPY IMPLANT;  Surgeon: Mark Gauze, MD;  Location: [facility];  Service: Urology;  Laterality: N/A;   right thumb surgery     yrs ago   SPACE OAR INSTILLATION N/A 07/24/2022   Procedure: SPACE OAR INSTILLATION;  Surgeon: Mark Gauze, MD;  Location: [facility];  Service: Urology;  Laterality: N/A;    Home Medications:  Allergies as of 04/20/2023       Reactions   Contrast Media [iodinated Contrast Media] Nausea And Vomiting, Swelling   Pt. Vomited immediately after IV dye injection.  Pt. Then had throat swelling and edema to uvula.     Acetaminophen    Other reaction(s): heart racing   Vicodin [hydrocodone-acetaminophen] Nausea And Vomiting        Medication List        Accurate as of April 20, 2023  1:11 PM. If you have any questions, ask your nurse or doctor.          abiraterone acetate 250 MG tablet Commonly known as: ZYTIGA Take 1,000 mg by mouth daily. Take on an empty stomach 1 hour before or 2 hours after a meal   alfuzosin 10 MG 24 hr tablet  Commonly known as: UROXATRAL Take 1 tablet (10 mg total) by mouth at bedtime.   amLODipine 10 MG tablet Commonly known as: NORVASC Take 1 tablet (10 mg total) by mouth daily.   cetirizine 10 MG tablet Commonly known as: ZYRTEC TAKE ONE TABLET BY MOUTH DAILY AS NEEDED FOR ALLERGIES   magic mouthwash (lidocaine, diphenhydrAMINE, alum & mag hydroxide) suspension Swish and spit 5 mLs 4 (four) times daily as needed for mouth pain.   meloxicam 7.5 MG tablet Commonly known as: MOBIC TAKE ONE TABLET BY MOUTH TWICE A DAY AS NEEDED FOR SEVERE PAIN   methocarbamol 500 MG tablet Commonly known as: ROBAXIN methocarbamol 500 mg tablet  Take 1 tablet every 8 hours by oral route as needed for  spasm for 10 days.   nicotine 7 mg/24hr patch Commonly known as: NICODERM CQ - dosed in mg/24 hr Not currently using will start before surgery on 04-23-2022   predniSONE 10 MG (21) Tbpk tablet Commonly known as: STERAPRED UNI-PAK 21 TAB Take by mouth daily. Take as directed.   silodosin 8 MG Caps capsule Commonly known as: RAPAFLO Take 1 capsule (8 mg total) by mouth daily with breakfast.   traMADol 50 MG tablet Commonly known as: Ultram Take 1 tablet (50 mg total) by mouth every 6 (six) hours as needed.        Allergies:  Allergies  Allergen Reactions   Contrast Media [Iodinated Contrast Media] Nausea And Vomiting and Swelling    Pt. Vomited immediately after IV dye injection.  Pt. Then had throat swelling and edema to uvula.     Acetaminophen     Other reaction(s): heart racing   Vicodin [Hydrocodone-Acetaminophen] Nausea And Vomiting    Family History: Family History  Problem Relation Age of Onset   Prostate cancer Father     Social History:  reports that he quit smoking about 3 years ago. His smoking use included cigarettes and cigars. He has a 26.00 pack-year smoking history. He has never used smokeless tobacco. He reports that he does not currently use alcohol. He reports that he does not use drugs.  ROS: All other review of systems were reviewed and are negative except what is noted above in HPI  Physical Exam: BP (!) 145/82   Pulse 87   Constitutional:  Alert and oriented, No acute distress. HEENT: Mark Anthony AT, moist mucus membranes.  Trachea midline, no masses. Cardiovascular: No clubbing, cyanosis, or edema. Respiratory: Normal respiratory effort, no increased work of breathing. GI: Abdomen is soft, nontender, nondistended, no abdominal masses GU: No CVA tenderness.  Lymph: No cervical or inguinal lymphadenopathy. Skin: No rashes, bruises or suspicious lesions. Neurologic: Grossly intact, no focal deficits, moving all 4 extremities. Psychiatric: Normal mood  and affect.  Laboratory Data: Lab Results  Component Value Date   WBC 15.5 (H) 05/24/2021   HGB 17.0 07/24/2022   HCT 50.0 07/24/2022   MCV 91.6 05/24/2021   PLT 358 05/24/2021    Lab Results  Component Value Date   CREATININE 0.80 07/24/2022    No results found for: "PSA"  No results found for: "TESTOSTERONE"  Lab Results  Component Value Date   HGBA1C 7.0 (H) 05/24/2021    Urinalysis    Component Value Date/Time   COLORURINE STRAW (A) 05/24/2021 0303   APPEARANCEUR Clear 09/22/2022 1605   LABSPEC 1.029 05/24/2021 0303   PHURINE 7.0 05/24/2021 0303   GLUCOSEU Negative 09/22/2022 1605   HGBUR NEGATIVE 05/24/2021 0303   BILIRUBINUR Negative 09/22/2022 1605  KETONESUR NEGATIVE 05/24/2021 0303   PROTEINUR Negative 09/22/2022 1605   PROTEINUR NEGATIVE 05/24/2021 0303   NITRITE Positive (A) 09/22/2022 1605   NITRITE NEGATIVE 05/24/2021 0303   LEUKOCYTESUR Negative 09/22/2022 1605   LEUKOCYTESUR NEGATIVE 05/24/2021 0303    Lab Results  Component Value Date   LABMICR See below: 09/22/2022   WBCUA 0-5 09/22/2022   LABEPIT 0-10 09/22/2022   BACTERIA None seen 09/22/2022    Pertinent Imaging:  No results found for this or any previous visit.  No results found for this or any previous visit.  No results found for this or any previous visit.  No results found for this or any previous visit.  No results found for this or any previous visit.  No valid procedures specified. No results found for this or any previous visit.  No results found for this or any previous visit.   Assessment & Plan:    1. Prostate cancer (HCC) -followup 6 months with PSA - Urinalysis, Routine w reflex microscopic  2. Benign prostatic hyperplasia with urinary obstruction -continue flomax and finasteride  3. Nocturia -continue oxybutynin -[facility].    No follow-ups on file.  Wilkie Aye, MD  [facility] [place]

## 2023-04-20 NOTE — Patient Instructions (Signed)

## 2023-06-25 ENCOUNTER — Ambulatory Visit: Payer: Non-veteran care | Admitting: Dietician

## 2023-07-02 ENCOUNTER — Telehealth: Payer: Self-pay | Admitting: Urology

## 2023-07-02 NOTE — Telephone Encounter (Signed)
Representative called requesting medical records to be sent from 5/23-present, there is a medical release on file for [place] to be able to send them records, but the patient put it to expired the day he signed it?   Auth number [id number]  Fax number [phone]

## 2023-07-03 NOTE — Telephone Encounter (Signed)
Patient is made aware that medical release form need to be sign for our office to release medical records. Patient states he will come by Monday 07/06/23 to sign release form. Voiced understanding

## 2023-07-14 ENCOUNTER — Ambulatory Visit: Payer: Non-veteran care | Admitting: Skilled Nursing Facility1

## 2023-08-11 ENCOUNTER — Encounter: Payer: Self-pay | Admitting: Urology

## 2023-08-13 ENCOUNTER — Encounter: Payer: No Typology Code available for payment source | Attending: Internal Medicine | Admitting: Dietician

## 2023-08-13 ENCOUNTER — Encounter: Payer: Self-pay | Admitting: Dietician

## 2023-08-13 DIAGNOSIS — Z794 Long term (current) use of insulin: Secondary | ICD-10-CM | POA: Insufficient documentation

## 2023-08-13 DIAGNOSIS — E119 Type 2 diabetes mellitus without complications: Secondary | ICD-10-CM | POA: Diagnosis present

## 2023-08-13 DIAGNOSIS — Z713 Dietary counseling and surveillance: Secondary | ICD-10-CM | POA: Insufficient documentation

## 2023-08-13 NOTE — Progress Notes (Signed)
Diabetes Self-Management Education  Visit Type: First/Initial  Appt. Start Time: 11:15 Appt. End Time: 12:25  08/13/2023  [name], identified by name and date of birth, is a 47 y.o. male with a diagnosis of Diabetes: Type 2.   ASSESSMENT  Patient is here today alone with cane. Patient would like to learn about food and diabetes. Patient lives with mom and wife at two different locations. Family does share shopping and cooking. Pt states he is addicted to sweet tea and cutting back currently after recognizing sugary beverages raise his blood sugars.  Pt reports he is decreasing his nightly Lantus stating "12" units each evening. Pt reports using libre 3 CGM with reader and finds this helpful to manage blood sugar. Pt reports appropriate treatment options for hypoglycemia. Pt reports most recent A1C obtained 2 weeks ago of "6.7%.   History includes:  Past Medical History:  Diagnosis Date   Anxiety    Arthritis    knees, gets right knee injection q 4 months   Chronic low back pain with left-sided sciatica    Diabetes mellitus without complication (HCC)    diet controlled does not check cbg at home, last hemaglobin a1c was 7  in june 2023 per pt   Hypertension    Prostate cancer [facility])    dx june 2023   Reflux    Sleep apnea    osa sleep study done  06-27-2022 no cpap yet per pt on 07-07-2022   TIA (transient ischemic attack)    1 and 1/2 yrs ago per pt on 07-07-2022  sees pcp dr  s [facility] at [facility]   Wears glasses     Labs noted:   Lab Results  Component Value Date   HGBA1C 7.0 (H) 05/24/2021  Per referring provider A1C obtained 03/2023 of 14%.    Medications include:   Current Outpatient Medications:    abiraterone acetate (ZYTIGA) 250 MG tablet, Take 1,000 mg by mouth daily. Take on an empty stomach 1 hour before or 2 hours after a meal, Disp: , Rfl:    atorvastatin (LIPITOR) 10 MG tablet, Take 10 mg by mouth daily., Disp: , Rfl:    Empagliflozin-metFORMIN HCl ER  25-1000 MG TB24, Take 1,000 tablets by mouth once., Disp: , Rfl:    insulin glargine (LANTUS) 100 UNIT/ML injection, Inject 14 Units into the skin at bedtime., Disp: , Rfl:    meloxicam (MOBIC) 7.5 MG tablet, TAKE ONE TABLET BY MOUTH TWICE A DAY AS NEEDED FOR SEVERE PAIN, Disp: , Rfl:    methocarbamol (ROBAXIN) 500 MG tablet, methocarbamol 500 mg tablet  Take 1 tablet every 8 hours by oral route as needed for spasm for 10 days., Disp: , Rfl:    oxyBUTYnin (DITROPAN) 5 MG/5ML solution, Take 5 mg by mouth 2 (two) times daily., Disp: , Rfl:    predniSONE (STERAPRED UNI-PAK 21 TAB) 10 MG (21) TBPK tablet, Take by mouth daily. Take as directed., Disp: 21 tablet, Rfl: 0   silodosin (RAPAFLO) 8 MG CAPS capsule, Take 1 capsule (8 mg total) by mouth daily with breakfast., Disp: 30 capsule, Rfl: 11   alfuzosin (UROXATRAL) 10 MG 24 hr tablet, Take 1 tablet (10 mg total) by mouth at bedtime. (Patient not taking: Reported on 08/13/2023), Disp: 30 tablet, Rfl: 11   amLODipine (NORVASC) 10 MG tablet, Take 1 tablet (10 mg total) by mouth daily., Disp: 30 tablet, Rfl: 11   cetirizine (ZYRTEC) 10 MG tablet, TAKE ONE TABLET BY MOUTH DAILY  AS NEEDED FOR ALLERGIES (Patient not taking: Reported on 08/13/2023), Disp: , Rfl:    magic mouthwash (lidocaine, diphenhydrAMINE, alum & mag hydroxide) suspension, Swish and spit 5 mLs 4 (four) times daily as needed for mouth pain. (Patient not taking: Reported on 08/13/2023), Disp: 360 mL, Rfl: 0   nicotine (NICODERM CQ - DOSED IN MG/24 HR) 7 mg/24hr patch, Not currently using will start before surgery on 04-23-2022 (Patient not taking: Reported on 08/13/2023), Disp: , Rfl:     CGM Results from download:   % Time CGM active:    93%    (Goal >70%)  Average glucose:  mg/dL   161 mg/dL for 7 days  Time in range (70-180 mg/dL):    99 %   (Goal >09%)  Time High (181-250 mg/dL):    1 %   (Goal < 60%)  Time Very High (>250 mg/dL):    0 %   (Goal < 5%)  Time Low (54-69 mg/dL):    0 %    (Goal <4%)  Time Very Low (<54 mg/dL):    0 %   (Goal <5%)    There were no vitals taken for this visit. There is no height or weight on file to calculate BMI.   Diabetes Self-Management Education - 08/13/23 1137       Visit Information   Visit Type First/Initial      Initial Visit   Diabetes Type Type 2    Are you currently following a meal plan? Yes    What type of meal plan do you follow? no added salt meal plan    Are you taking your medications as prescribed? Yes      Health Coping   How would you rate your overall health? Good      Psychosocial Assessment   Patient Belief/Attitude about Diabetes Motivated to manage diabetes    What is the hardest part about your diabetes right now, causing you the most concern, or is the most worrisome to you about your diabetes?   Other (comment)   preventing complcations to my feet   Self-care barriers None    Self-management support Doctor's office    Other persons present Patient    Patient Concerns Nutrition/Meal planning    Special Needs None    Preferred Learning Style No preference indicated    Learning Readiness Change in progress    How often do you need to have someone help you when you read instructions, pamphlets, or other written materials from your doctor or pharmacy? 1 - Never    What is the last grade level you completed in school? some college      Pre-Education Assessment   Patient understands the diabetes disease and treatment process. Needs Instruction    Patient understands incorporating nutritional management into lifestyle. Needs Instruction    Patient undertands incorporating physical activity into lifestyle. Needs Instruction    Patient understands using medications safely. Needs Instruction    Patient understands monitoring blood glucose, interpreting and using results Needs Instruction    Patient understands prevention, detection, and treatment of acute complications. Needs Instruction    Patient understands  prevention, detection, and treatment of chronic complications. Needs Instruction    Patient understands how to develop strategies to address psychosocial issues. Needs Instruction    Patient understands how to develop strategies to promote health/change behavior. Needs Instruction      Complications   Last HgB A1C per patient/outside source 6.7 %    How often  do you check your blood sugar? > 4 times/day    Fasting Blood glucose range (mg/dL) 78-295    Number of hypoglycemic episodes per month 1    Can you tell when your blood sugar is low? Yes    What do you do if your blood sugar is low? tired, low energy    Number of hyperglycemic episodes ( >200mg /dL): Occasional    Can you tell when your blood sugar is high? Yes    What do you do if your blood sugar is high? I feel spaced out    Have you had a dilated eye exam in the past 12 months? Yes    Have you had a dental exam in the past 12 months? Yes    Are you checking your feet? Yes    How many days per week are you checking your feet? 7      Dietary Intake   Breakfast omelet with cheese, ham, bacon, steak, mushrooms, grits, sweet tea, water    Lunch chicken big mac, sprite, french fries small    Dinner beef liver, with onions and gravy, wild rice, collared greens, water    Snack (evening) pecans, grapes or apple    Beverage(s) water, sweet tea, sprite,      Activity / Exercise   Activity / Exercise Type ADL's    How many days per week do you exercise? 0    How many minutes per day do you exercise? 0    Total minutes per week of exercise 0      Patient Education   Previous Diabetes Education No    Disease Pathophysiology Definition of diabetes, type 1 and 2, and the diagnosis of diabetes;Explored patient's options for treatment of their diabetes      Individualized Goals (developed by patient)   Nutrition Follow meal plan discussed    Physical Activity Exercise 1-2 times per week    Medications take my medication as prescribed     Monitoring  Consistenly use CGM    Problem Solving Social situations;Addressing barriers to behavior change    Reducing Risk examine blood glucose patterns;treat hypoglycemia with 15 grams of carbs if blood glucose less than 70mg /dL;do foot checks daily    Health Coping Discuss barriers to diabetes care with support person/system (comment specifics as needed)      Post-Education Assessment   Patient understands the diabetes disease and treatment process. Needs Instruction    Patient understands incorporating nutritional management into lifestyle. Needs Instruction    Patient undertands incorporating physical activity into lifestyle. Needs Instruction    Patient understands using medications safely. Needs Instruction    Patient understands monitoring blood glucose, interpreting and using results Needs Instruction    Patient understands prevention, detection, and treatment of acute complications. Needs Instruction    Patient understands prevention, detection, and treatment of chronic complications. Needs Instruction    Patient understands how to develop strategies to address psychosocial issues. Needs Instruction    Patient understands how to develop strategies to promote health/change behavior. Needs Instruction      Outcomes   Expected Outcomes Demonstrated interest in learning. Expect positive outcomes    Future DMSE 3-4 months    Program Status Not Completed             Individualized Plan for Diabetes Self-Management Training:   Learning Objective:  Patient will have a greater understanding of diabetes self-management. Patient education plan is to attend individual and/or group sessions per assessed needs and  concerns.   Plan:   Patient Instructions  1-Continue to decrease sugary sweetened beverages aiming for full omission  2-Start physical activity with 2 days weekly  Walking, chair exercise, swimming 20 minutes as tolerated   Expected Outcomes:  Demonstrated interest  in learning. Expect positive outcomes  Education material provided: ADA - How to Thrive: A Guide for Your Journey with Diabetes, My Plate, and Snack sheet  If problems or questions, patient to contact team via:  Phone  Future DSME appointment: 3-4 months

## 2023-08-13 NOTE — Patient Instructions (Addendum)
1-Continue to decrease sugary sweetened beverages aiming for full omission  2-Start physical activity with 2 days weekly  Walking, chair exercise, swimming 20 minutes as tolerated

## 2023-09-21 ENCOUNTER — Other Ambulatory Visit: Payer: No Typology Code available for payment source

## 2023-09-22 ENCOUNTER — Encounter (INDEPENDENT_AMBULATORY_CARE_PROVIDER_SITE_OTHER): Payer: Self-pay | Admitting: *Deleted

## 2023-09-23 LAB — PSA: Prostate Specific Ag, Serum: <0.1 ng/mL (ref 0.0–4.0)

## 2023-09-25 ENCOUNTER — Other Ambulatory Visit: Payer: Non-veteran care

## 2023-09-30 ENCOUNTER — Ambulatory Visit: Payer: Non-veteran care | Admitting: Urology

## 2023-10-01 ENCOUNTER — Encounter (INDEPENDENT_AMBULATORY_CARE_PROVIDER_SITE_OTHER): Payer: Self-pay | Admitting: *Deleted

## 2023-11-16 ENCOUNTER — Ambulatory Visit (INDEPENDENT_AMBULATORY_CARE_PROVIDER_SITE_OTHER): Payer: Non-veteran care | Admitting: Gastroenterology

## 2023-11-17 ENCOUNTER — Telehealth (INDEPENDENT_AMBULATORY_CARE_PROVIDER_SITE_OTHER): Payer: Self-pay | Admitting: Gastroenterology

## 2023-11-17 ENCOUNTER — Encounter (INDEPENDENT_AMBULATORY_CARE_PROVIDER_SITE_OTHER): Payer: Self-pay | Admitting: Gastroenterology

## 2023-11-17 ENCOUNTER — Ambulatory Visit (INDEPENDENT_AMBULATORY_CARE_PROVIDER_SITE_OTHER): Payer: Non-veteran care | Admitting: Gastroenterology

## 2023-11-17 VITALS — BP 132/80 | HR 97 | Temp 98.1°F | Ht 73.0 in | Wt 254.1 lb

## 2023-11-17 DIAGNOSIS — R634 Abnormal weight loss: Secondary | ICD-10-CM

## 2023-11-17 DIAGNOSIS — K219 Gastro-esophageal reflux disease without esophagitis: Secondary | ICD-10-CM | POA: Diagnosis not present

## 2023-11-17 DIAGNOSIS — Z1211 Encounter for screening for malignant neoplasm of colon: Secondary | ICD-10-CM | POA: Insufficient documentation

## 2023-11-17 DIAGNOSIS — Z6833 Body mass index (BMI) 33.0-33.9, adult: Secondary | ICD-10-CM | POA: Insufficient documentation

## 2023-11-17 DIAGNOSIS — E0800 Diabetes mellitus due to underlying condition with hyperosmolarity without nonketotic hyperglycemic-hyperosmolar coma (NKHHC): Secondary | ICD-10-CM | POA: Insufficient documentation

## 2023-11-17 MED ORDER — PEG 3350-KCL-NA BICARB-NACL 420 G PO SOLR: 4000.0000 mL | Freq: Once | ORAL | 0 refills | Status: AC

## 2023-11-17 NOTE — Patient Instructions (Signed)
It was very nice to meet you today, as dicussed with will plan for the following :  1) Screening colonoscopy   2) Upper endoscopy and CT abdomen for weight loss

## 2023-11-17 NOTE — Addendum Note (Signed)
Addended by: Marlowe Shores on: 11/17/2023 11:23 AM   Modules accepted: Orders

## 2023-11-17 NOTE — Telephone Encounter (Signed)
Order changed to without contrast. Contacted central scheduling to inform them of change. No change in date or time

## 2023-11-17 NOTE — Telephone Encounter (Signed)
Pt CT scan scheduled for 11/30/23. In chart, it states pt has allergy to Contrast media. Can we send in something for pt to use before CT to [facility]? Please advise. Thank you

## 2023-11-17 NOTE — Telephone Encounter (Signed)
Because he is allergic to contrast we can change it to without contrast for now

## 2023-11-17 NOTE — Progress Notes (Signed)
[name] , M.D. Gastroenterology & Hepatology [facility] [address] [place], [place] [address] Primary Care Physician: [facility] [address] [name] [address]  Chief Complaint:  weight loss, GERD  and colon cancer screening  History of Present Illness: [facility] is a 48 y.o. male with type 2 diabetes, hypertension, obesity, TIA x2 2019 2020 , prostate CA status post radiation and chemo , OSA not on CPAP , GERD status post ? Fundoplication in early 2000's who presents for evaluation of abnormal weight loss, GERD  and colon cancer screening.  Patient reports that last year he was diagnosed with diabetes and hemoglobin A1c of 14.  He reports he was started on Jardiance and last A1c now is 6.0.  Patient has a good appetite but reports since December he has lost 20 pounds as his baseline weight is 270.  Although he attributes his weight loss to diabetes he is concerned as this is considered unintentional weight lossThe patient denies having any nausea, vomiting, fever, chills, hematochezia, melena, hematemesis, abdominal distention, abdominal pain, diarrhea, jaundice, pruritus.  Patient reports many years ago he had horrible heartburn for which she underwent surgery and since that his symptoms have resolved  Last EGD:20 years ago  Last Colonoscopy: none  FHx: neg for any gastrointestinal/liver disease, no malignancies Social: neg smoking, alcohol or illicit drug use Surgical: no abdominal surgeries  Past Medical History: Past Medical History:  Diagnosis Date   Anxiety    Arthritis    knees, gets right knee injection q 4 months   Chronic low back pain with left-sided sciatica    Diabetes mellitus without complication ([facility])    diet controlled does not check cbg at home, last hemaglobin a1c was 7  in june 2023 per pt   Hypertension    Prostate cancer ([facility])    dx june 2023   Reflux    Sleep  apnea    osa sleep study done  06-27-2022 no cpap yet per pt on 07-07-2022   TIA (transient ischemic attack)    1 and 1/2 yrs ago per pt on 07-07-2022  se[facility] pcp [facility]  s [facility]   Wears glasses     Past Surgical History: Past Surgical History:  Procedure Laterality Date   ACHILLES TENDON SURGERY Right 02/02/2020   Procedure: Right Achilles tendon debridement and reconstruction;  Surgeon: Mark Arthurs, [facility];  Location: [facility];  Service: Orthopedics;  Laterality: Right;   ACHILLES TENDON SURGERY Right 12/13/2020   Procedure: Right Achilles Tendon Reconstruction;  Surgeon: Mark Arthurs, [facility];  Location: [facility];  Service: Orthopedics;  Laterality: Right;   CYSTOSCOPY  07/24/2022   Procedure: CYSTOSCOPY;  Surgeon: Mark Gauze, [facility];  [facility];  Service: Urology;;   ELBOW FRACTURE SURGERY Left    yrs ago   GASTRIC FUNDOPLICATION     yrs ago   GASTROC RECESSION EXTREMITY Right 12/13/2020   Procedure: Gastroc Recession;  Surgeon: Mark Arthurs, [facility];  Location: [facility];  Service: Orthopedics;  Laterality: Right;   RADIOACTIVE SEED IMPLANT [facility] 07/24/2022   Procedure: RADIOACTIVE SEED IMPLANT/BRACHYTHERAPY IMPLANT;  Surgeon: Mark Gauze, [facility];  Location: [facility];  Service: Urology;  Laterality: [facility];   right thumb surgery     yrs ago   SPACE OAR INSTILLATION [facility] 07/24/2022   Procedure: SPACE OAR INSTILLATION;  Surgeon: Mark Gauze, [facility];  [facility];  Service: Urology;  Laterality: [facility];    Family History: Family History  Problem Relation Age of Onset   Prostate cancer Father     Social History: Social History   Tobacco Use  Smoking Status Former   Current packs/day: 0.00   Average packs/day: 1 pack/day for 26.0 years (26.0 ttl pk-yrs)   Types: Cigarettes, Cigars   Start date: 47   Quit date: 2021   Years since quitting: 4.0   Passive  exposure: Past  Smokeless Tobacco Never  Tobacco Comments   Smokes 2 to 3 cigars per day now per pt on 07-07-2022   Social History   Substance and Sexual Activity  Alcohol Use Not Currently   Social History   Substance and Sexual Activity  Drug Use No    Allergies: Allergies  Allergen Reactions   Contrast Media [Iodinated Contrast Media] Nausea And Vomiting and Swelling    Pt. Vomited immediately after IV dye injection.  Pt. Then had throat swelling and edema to uvula.     Acetaminophen     Other reaction(s): heart racing   Vicodin [Hydrocodone-Acetaminophen] Nausea And Vomiting    Medications: Current Outpatient Medications  Medication Sig Dispense Refill   abiraterone acetate (ZYTIGA) 250 MG tablet Take 1,000 mg by mouth daily. Take on an empty stomach 1 hour before or 2 hours after a meal     amLODipine (NORVASC) 10 MG tablet Take 1 tablet (10 mg total) by mouth daily. 30 tablet 11   atorvastatin (LIPITOR) 10 MG tablet Take 10 mg by mouth daily.     cetirizine (ZYRTEC) 10 MG tablet      Empagliflozin-metFORMIN HCl ER 25-1000 MG TB24 Take 1,000 tablets by mouth once.     meloxicam (MOBIC) 7.5 MG tablet TAKE ONE TABLET BY MOUTH TWICE A DAY AS NEEDED FOR SEVERE PAIN     methocarbamol (ROBAXIN) 500 MG tablet methocarbamol 500 mg tablet  Take 1 tablet every 8 hours by oral route as needed for spasm for 10 days.     predniSONE (STERAPRED UNI-PAK 21 TAB) 10 MG (21) TBPK tablet Take by mouth daily. Take as directed. 21 tablet 0   silodosin (RAPAFLO) 8 MG CAPS capsule Take 1 capsule (8 mg total) by mouth daily with breakfast. 30 capsule 11   No current facility-administered medications for this visit.    Review of Systems: GENERAL: negative for malaise, night sweats HEENT: No changes in hearing or vision, no nose bleeds or other nasal problems. NECK: Negative for lumps, goiter, pain and significant neck swelling RESPIRATORY: Negative for cough, wheezing CARDIOVASCULAR:  Negative for chest pain, leg swelling, palpitations, orthopnea GI: SEE HPI MUSCULOSKELETAL: Negative for joint pain or swelling, back pain, and muscle pain. SKIN: Negative for lesions, rash HEMATOLOGY Negative for prolonged bleeding, bruising easily, and swollen nodes. ENDOCRINE: Negative for cold or heat intolerance, polyuria, polydipsia and goiter. NEURO: negative for tremor, gait imbalance, syncope and seizures. The remainder of the review of systems is noncontributory.   Physical Exam: BP 132/80   Pulse 97   Temp 98.1 F (36.7 C) (Oral)   Ht 6\' 1"  (1.854 m)   Wt 254 lb 1.6 oz (115.3 kg)   BMI 33.52 kg/m  GENERAL: The patient is AO x3, in no acute distress. HEENT: Head is normocephalic and atraumatic. EOMI are intact. Mouth is well hydrated and without lesions. NECK: Supple. No masses LUNGS: Clear to auscultation. No presence of rhonchi/wheezing/rales. Adequate chest expansion HEART: RRR, normal s1 and  s2. ABDOMEN: Soft, nontender, no guarding, no peritoneal signs, and nondistended. BS +. No masses.   Imaging/Labs: as above     Latest Ref Rng & Units 07/24/2022   12:29 PM 05/24/2021    3:11 AM 05/24/2021    3:03 AM  CBC  WBC 4.0 - 10.5 K/uL   15.5   Hemoglobin 13.0 - 17.0 g/dL 16.1  09.6  04.5   Hematocrit 39.0 - 52.0 % 50.0  46.0  44.7   Platelets 150 - 400 K/uL   358    No results found for: "IRON", "TIBC", "FERRITIN"  I personally reviewed and interpreted the available labs, imaging and endoscopic files.  Impression and Plan: [facility] is a 48 y.o. male with type 2 diabetes, hypertension, obesity, TIA x2 2019 2020 , prostate CA status post radiation and chemo , OSA not on CPAP , GERD status post ? Fundoplication in early 2000's who presents for evaluation of abnormal weight loss, GERD  and colon cancer screening.  #Unintentional weight loss #GERD  Patient has good appetite but reports baseline weight of 270 in December 2024 and today 254 pounds.  Hence he had  around 16 pound weight loss in just 1 month.  Although patient attributes it to diabetes as per the patient his last A1c is 6.0 which is well-controlled diabetes  Patient has a history of chronic GERD status post surgery which I assume to be a simple medication  Given unintentional weight loss we will proceed with upper endoscopy and CT abdomen pelvis with history of prior malignancy (prostate cancer)  #Screening colonoscopy   The patient was counseled regarding the importance of colorectal cancer screening, particularly starting at age 28 due to the rising incidence of colorectal cancer in younger individuals. The benefits of screening include early detection of colorectal cancer and precancerous polyps, which can improve treatment outcomes and reduce mortality. Risks associated with screening, particularly colonoscopy, include potential complications such as bleeding and perforation. After deciding different modalities for screening for colon cancer , patient has opted to pursue Colonoscopy   All questions were answered.      [name], [facility]   This chart has been completed using [employer] Dictation software, and while attempts have been made to ensure accuracy , certain words and phrases may not be transcribed as intended

## 2023-11-20 ENCOUNTER — Ambulatory Visit: Payer: Self-pay | Admitting: Urology

## 2023-11-20 ENCOUNTER — Encounter: Payer: Self-pay | Admitting: Urology

## 2023-11-20 VITALS — BP 166/89 | HR 121

## 2023-11-20 DIAGNOSIS — N138 Other obstructive and reflux uropathy: Secondary | ICD-10-CM

## 2023-11-20 DIAGNOSIS — N401 Enlarged prostate with lower urinary tract symptoms: Secondary | ICD-10-CM

## 2023-11-20 DIAGNOSIS — R351 Nocturia: Secondary | ICD-10-CM

## 2023-11-20 DIAGNOSIS — C61 Malignant neoplasm of prostate: Secondary | ICD-10-CM

## 2023-11-20 NOTE — Progress Notes (Signed)
11/20/2023 10:44 AM   Mark Anthony 08-Jul-1976 [id number]  Referring provider: Clinic, [facility] [address] [facility] [place],  [place] [address]  Followup prostate cancer   HPI: [name] is a 47yo here for followup for prostate cancer and BPH. PSA remains undetectable on xytiga. IPSS 19 QOL 3. He was started on sanctura 20mg  BID several weeks ago whoich significantly improved his urinary frequency and nocturia. Urine stream fair. No strainign to urinate.    PMH: Past Medical History:  Diagnosis Date   Anxiety    Arthritis    knees, gets right knee injection q 4 months   Chronic low back pain with left-sided sciatica    Diabetes mellitus without complication (HCC)    diet controlled does not check cbg at home, last hemaglobin a1c was 7  in june 2023 per pt   Hypertension    Prostate cancer [facility])    dx june 2023   Reflux    Sleep apnea    osa sleep study done  06-27-2022 no cpap yet per pt on 07-07-2022   TIA (transient ischemic attack)    1 and 1/2 yrs ago per pt on 07-07-2022  sees pcp dr  s [facility] at [facility]   Wears glasses     Surgical History: Past Surgical History:  Procedure Laterality Date   ACHILLES TENDON SURGERY Right 02/02/2020   Procedure: Right Achilles tendon debridement and reconstruction;  Surgeon: Toni Arthurs, MD;  Location: [facility];  Service: Orthopedics;  Laterality: Right;   ACHILLES TENDON SURGERY Right 12/13/2020   Procedure: Right Achilles Tendon Reconstruction;  Surgeon: Toni Arthurs, MD;  Location: [facility];  Service: Orthopedics;  Laterality: Right;   CYSTOSCOPY  07/24/2022   Procedure: CYSTOSCOPY;  Surgeon: Malen Gauze, MD;  Location: [facility];  Service: Urology;;   ELBOW FRACTURE SURGERY Left    yrs ago   GASTRIC FUNDOPLICATION     yrs ago   GASTROC RECESSION EXTREMITY Right 12/13/2020   Procedure: Gastroc Recession;  Surgeon: Toni Arthurs, MD;  Location: [facility];  Service: Orthopedics;  Laterality: Right;   RADIOACTIVE SEED IMPLANT N/A 07/24/2022   Procedure: RADIOACTIVE SEED IMPLANT/BRACHYTHERAPY IMPLANT;  Surgeon: Malen Gauze, MD;  Location: [facility];  Service: Urology;  Laterality: N/A;   right thumb surgery     yrs ago   SPACE OAR INSTILLATION N/A 07/24/2022   Procedure: SPACE OAR INSTILLATION;  Surgeon: Malen Gauze, MD;  Location: [facility];  Service: Urology;  Laterality: N/A;    Home Medications:  Allergies as of 11/20/2023       Reactions   Contrast Media [iodinated Contrast Media] Nausea And Vomiting, Swelling   Pt. Vomited immediately after IV dye injection.  Pt. Then had throat swelling and edema to uvula.     Acetaminophen    Other reaction(s): heart racing   Vicodin [hydrocodone-acetaminophen] Nausea And Vomiting        Medication List        Accurate as of November 20, 2023 10:44 AM. If you have any questions, ask your nurse or doctor.          abiraterone acetate 250 MG tablet Commonly known as: ZYTIGA Take 1,000 mg by mouth daily. Take on an empty stomach 1 hour before or 2 hours after a meal   amLODipine 10 MG tablet Commonly known as: NORVASC Take 1 tablet (10 mg total) by mouth daily.  atorvastatin 10 MG tablet Commonly known as: LIPITOR Take 10 mg by mouth daily.   cetirizine 10 MG tablet Commonly known as: ZYRTEC   Empagliflozin-metFORMIN HCl ER 25-1000 MG Tb24 Take 1,000 tablets by mouth once.   meloxicam 7.5 MG tablet Commonly known as: MOBIC TAKE ONE TABLET BY MOUTH TWICE A DAY AS NEEDED FOR SEVERE PAIN   methocarbamol 500 MG tablet Commonly known as: ROBAXIN methocarbamol 500 mg tablet  Take 1 tablet every 8 hours by oral route as needed for spasm for 10 days.   predniSONE 10 MG (21) Tbpk tablet Commonly known as: STERAPRED UNI-PAK 21 TAB Take by mouth daily. Take as directed.   silodosin 8 MG Caps capsule Commonly  known as: RAPAFLO Take 1 capsule (8 mg total) by mouth daily with breakfast.        Allergies:  Allergies  Allergen Reactions   Contrast Media [Iodinated Contrast Media] Nausea And Vomiting and Swelling    Pt. Vomited immediately after IV dye injection.  Pt. Then had throat swelling and edema to uvula.     Acetaminophen     Other reaction(s): heart racing   Vicodin [Hydrocodone-Acetaminophen] Nausea And Vomiting    Family History: Family History  Problem Relation Age of Onset   Prostate cancer Father     Social History:  reports that he quit smoking about 4 years ago. His smoking use included cigarettes and cigars. He started smoking about 30 years ago. He has a 26 pack-year smoking history. He has been exposed to tobacco smoke. He has never used smokeless tobacco. He reports that he does not currently use alcohol. He reports that he does not use drugs.  ROS: All other review of systems were reviewed and are negative except what is noted above in HPI  Physical Exam: BP (!) 166/89   Pulse (!) 121   Constitutional:  Alert and oriented, No acute distress. HEENT: [place] AT, moist mucus membranes.  Trachea midline, no masses. Cardiovascular: No clubbing, cyanosis, or edema. Respiratory: Normal respiratory effort, no increased work of breathing. GI: Abdomen is soft, nontender, nondistended, no abdominal masses GU: No CVA tenderness.  Lymph: No cervical or inguinal lymphadenopathy. Skin: No rashes, bruises or suspicious lesions. Neurologic: Grossly intact, no focal deficits, moving all 4 extremities. Psychiatric: Normal mood and affect.  Laboratory Data: Lab Results  Component Value Date   WBC 15.5 (H) 05/24/2021   HGB 17.0 07/24/2022   HCT 50.0 07/24/2022   MCV 91.6 05/24/2021   PLT 358 05/24/2021    Lab Results  Component Value Date   CREATININE 0.80 07/24/2022    No results found for: "PSA"  No results found for: "TESTOSTERONE"  Lab Results  Component Value Date    HGBA1C 7.0 (H) 05/24/2021    Urinalysis    Component Value Date/Time   COLORURINE STRAW (A) 05/24/2021 0303   APPEARANCEUR Clear 04/20/2023 1302   LABSPEC 1.029 05/24/2021 0303   PHURINE 7.0 05/24/2021 0303   GLUCOSEU 3+ (A) 04/20/2023 1302   HGBUR NEGATIVE 05/24/2021 0303   BILIRUBINUR Negative 04/20/2023 1302   KETONESUR NEGATIVE 05/24/2021 0303   PROTEINUR Negative 04/20/2023 1302   PROTEINUR NEGATIVE 05/24/2021 0303   NITRITE Negative 04/20/2023 1302   NITRITE NEGATIVE 05/24/2021 0303   LEUKOCYTESUR Negative 04/20/2023 1302   LEUKOCYTESUR NEGATIVE 05/24/2021 0303    Lab Results  Component Value Date   LABMICR See below: 04/20/2023   WBCUA 0-5 04/20/2023   LABEPIT 0-10 04/20/2023   BACTERIA None seen 04/20/2023  Pertinent Imaging:  No results found for this or any previous visit.  No results found for this or any previous visit.  No results found for this or any previous visit.  No results found for this or any previous visit.  No results found for this or any previous visit.  No results found for this or any previous visit.  No results found for this or any previous visit.  No results found for this or any previous visit.   Assessment & Plan:    1. Prostate cancer (HCC) (Primary) Followup 6 months with PSA  2. Benign prostatic hyperplasia with urinary obstruction Continue rapaflo 8mg  daily  3. Nocturia Continue rapaflo 8mg  daily.    No follow-ups on file.  Wilkie Aye, MD  [facility] [place]

## 2023-11-20 NOTE — Patient Instructions (Signed)

## 2023-11-30 ENCOUNTER — Ambulatory Visit (HOSPITAL_COMMUNITY)
Admission: RE | Admit: 2023-11-30 | Discharge: 2023-11-30 | Disposition: A | Discharge status: Home / Self Care | Payer: No Typology Code available for payment source | Source: Ambulatory Visit | Attending: Gastroenterology | Admitting: Gastroenterology

## 2023-11-30 DIAGNOSIS — R634 Abnormal weight loss: Secondary | ICD-10-CM | POA: Insufficient documentation

## 2023-12-08 ENCOUNTER — Encounter (INDEPENDENT_AMBULATORY_CARE_PROVIDER_SITE_OTHER): Payer: Self-pay | Admitting: Gastroenterology

## 2023-12-08 NOTE — Progress Notes (Signed)
CT Abdomen and pelvis Without contrast ( done for unintentional weight loss, has contrast allergy)  IMPRESSION: 1. No acute abnormality in the abdomen or pelvis. 2. Mild symmetric wall thickening of a nondistended urinary bladder, correlate with urinalysis to exclude cystitis. 3. Moderate-sized hiatal hernia. 4.  Aortic Atherosclerosis (ICD10-I70.0).  If patient continues to have weight loss and negative upper endoscopy and colonoscopy may benefit from MRI with contrast

## 2023-12-09 ENCOUNTER — Other Ambulatory Visit (INDEPENDENT_AMBULATORY_CARE_PROVIDER_SITE_OTHER): Payer: Self-pay | Admitting: *Deleted

## 2023-12-09 ENCOUNTER — Encounter (INDEPENDENT_AMBULATORY_CARE_PROVIDER_SITE_OTHER): Payer: Self-pay | Admitting: Gastroenterology

## 2023-12-10 NOTE — Telephone Encounter (Signed)
Mitzie please put patient in with Dr. Tasia Catchings or Leeroy Bock if anyone cancels before his [facility] needs renewing ( I think it is the 23rd of this month but not sure )

## 2023-12-11 ENCOUNTER — Emergency Department (HOSPITAL_COMMUNITY): Payer: No Typology Code available for payment source

## 2023-12-11 ENCOUNTER — Other Ambulatory Visit: Payer: Self-pay

## 2023-12-11 ENCOUNTER — Emergency Department (HOSPITAL_COMMUNITY)
Admission: EM | Admit: 2023-12-11 | Discharge: 2023-12-11 | Disposition: A | Discharge status: Home / Self Care | Payer: No Typology Code available for payment source | Attending: Student | Admitting: Student

## 2023-12-11 ENCOUNTER — Encounter (HOSPITAL_COMMUNITY): Payer: Self-pay

## 2023-12-11 DIAGNOSIS — Z79899 Other long term (current) drug therapy: Secondary | ICD-10-CM | POA: Insufficient documentation

## 2023-12-11 DIAGNOSIS — K610 Anal abscess: Secondary | ICD-10-CM | POA: Insufficient documentation

## 2023-12-11 DIAGNOSIS — K6289 Other specified diseases of anus and rectum: Secondary | ICD-10-CM | POA: Diagnosis present

## 2023-12-11 LAB — CBC WITH DIFFERENTIAL/PLATELET
Abs Immature Granulocytes: 0.05 10*3/uL (ref 0.00–0.07)
Basophils Absolute: 0.1 10*3/uL (ref 0.0–0.1)
Basophils Relative: 1 %
Eosinophils Absolute: 0.2 10*3/uL (ref 0.0–0.5)
Eosinophils Relative: 2 %
HCT: 43 % (ref 39.0–52.0)
Hemoglobin: 14.2 g/dL (ref 13.0–17.0)
Immature Granulocytes: 0 %
Lymphocytes Relative: 11 %
Lymphs Abs: 1.6 10*3/uL (ref 0.7–4.0)
MCH: 31.1 pg (ref 26.0–34.0)
MCHC: 33 g/dL (ref 30.0–36.0)
MCV: 94.3 fL (ref 80.0–100.0)
Monocytes Absolute: 0.7 10*3/uL (ref 0.1–1.0)
Monocytes Relative: 5 %
Neutro Abs: 11.4 10*3/uL — ABNORMAL HIGH (ref 1.7–7.7)
Neutrophils Relative %: 81 %
Platelets: 379 10*3/uL (ref 150–400)
RBC: 4.56 MIL/uL (ref 4.22–5.81)
RDW: 14.8 % (ref 11.5–15.5)
WBC: 14.1 10*3/uL — ABNORMAL HIGH (ref 4.0–10.5)
nRBC: 0 % (ref 0.0–0.2)

## 2023-12-11 LAB — URINALYSIS, ROUTINE W REFLEX MICROSCOPIC
Bacteria, UA: NONE SEEN
Bilirubin Urine: NEGATIVE
Glucose, UA: >=500 mg/dL — AB
Hgb urine dipstick: NEGATIVE
Ketones, ur: NEGATIVE mg/dL
Leukocytes,Ua: NEGATIVE
Nitrite: NEGATIVE
Protein, ur: NEGATIVE mg/dL
Specific Gravity, Urine: 1.035 — ABNORMAL HIGH (ref 1.005–1.030)
pH: 6 (ref 5.0–8.0)

## 2023-12-11 LAB — BASIC METABOLIC PANEL
Anion gap: 11 (ref 5–15)
BUN: 13 mg/dL (ref 6–20)
CO2: 26 mmol/L (ref 22–32)
Calcium: 9.1 mg/dL (ref 8.9–10.3)
Chloride: 103 mmol/L (ref 98–111)
Creatinine, Ser: 0.72 mg/dL (ref 0.61–1.24)
GFR, Estimated: >60 mL/min (ref >60)
Glucose, Bld: 157 mg/dL — ABNORMAL HIGH (ref 70–99)
Potassium: 3.5 mmol/L (ref 3.5–5.1)
Sodium: 140 mmol/L (ref 135–145)

## 2023-12-11 MED ORDER — CEFADROXIL 500 MG PO CAPS: 1000.0000 mg | ORAL_CAPSULE | Freq: Every day | ORAL | 0 refills | Status: AC

## 2023-12-11 MED ORDER — LIDOCAINE HCL (PF) 1 % IJ SOLN
10.0000 mL | Freq: Once | INTRAMUSCULAR | Status: DC
  Filled 2023-12-11: qty 30

## 2023-12-11 NOTE — ED Provider Notes (Signed)
Patient given in sign out by Nechama Guard, PA-C.  Please review their note for patient HPI, physical exam, workup.  At this time the plan is follow-up on CT scan dissipate discharge with antibiotics.  CT shows cellulitis in the left gluteal cleft without signs of Fournier's.  At this time patient stable to be discharged with antibiotics and follow-up with his primary care provider.  Patient given return precautions.  Patient verbalized understanding acceptance of plan.  Patient stable to be discharged.   Netta Corrigan, PA-C 12/11/23 1545    Glendora Score, MD 12/11/23 [phone]

## 2023-12-11 NOTE — ED Provider Notes (Signed)
[place] EMERGENCY DEPARTMENT AT [facility] Provider Note   CSN: [id number] Arrival date & time: 12/11/23  1015     History  Chief Complaint  Patient presents with   Rectal Pain    Mark Anthony is a 48 y.o. male.  HPI Mark Anthony , a 48 y.o. male  was evaluated in triage.  Pt complains of perineal pain and swelling 1 week with bleeding started yesterday. Seen at UC 2 days ago, who were unsure of Dx. States it feels "sore." Cannot see PCP until Thursday. Denies trauma to area. Believes this all happened after abdomen/pelvic CT done on 2/3.    Denies fevers, chills, chest pain, shortness of breath, abdominal pain, n/v/d, constipation, straining, dysuria, LE swelling.     Home Medications Prior to Admission medications   Medication Sig Start Date End Date Taking? Authorizing Provider  cefadroxil (DURICEF) 500 MG capsule Take 2 capsules (1,000 mg total) by mouth daily for 7 days. 12/11/23 12/18/23 Yes Lunette Stands, PA-C  abiraterone acetate (ZYTIGA) 250 MG tablet Take 1,000 mg by mouth daily. Take on an empty stomach 1 hour before or 2 hours after a meal    [provider]  amLODipine (NORVASC) 10 MG tablet Take 1 tablet (10 mg total) by mouth daily. 05/24/21 11/17/23  Shon Hale, MD  atorvastatin (LIPITOR) 10 MG tablet Take 10 mg by mouth daily.    [provider]  cetirizine (ZYRTEC) 10 MG tablet  02/27/22   [provider]  Empagliflozin-metFORMIN HCl ER 25-1000 MG TB24 Take 1,000 tablets by mouth once.    [provider]  meloxicam (MOBIC) 7.5 MG tablet TAKE ONE TABLET BY MOUTH TWICE A DAY AS NEEDED FOR SEVERE PAIN 02/19/22   [provider]  methocarbamol (ROBAXIN) 500 MG tablet methocarbamol 500 mg tablet  Take 1 tablet every 8 hours by oral route as needed for spasm for 10 days.    [provider]  predniSONE (STERAPRED UNI-PAK 21 TAB) 10 MG (21) TBPK tablet Take by mouth daily. Take as directed. 02/24/23    Mardella Layman, MD  silodosin (RAPAFLO) 8 MG CAPS capsule Take 1 capsule (8 mg total) by mouth daily with breakfast. 09/22/22   McKenzie, Mardene Celeste, MD      Allergies    Contrast media [iodinated contrast media], Acetaminophen, and Vicodin [hydrocodone-acetaminophen]    Review of Systems   Review of Systems  Gastrointestinal:  Positive for rectal pain.  All other systems reviewed and are negative.   Physical Exam Updated Vital Signs BP 128/75   Pulse 79   Temp 98 F (36.7 C)   Resp 18   Ht 6\' 1"  (1.854 m)   Wt 115.7 kg   SpO2 97%   BMI 33.65 kg/m  Physical Exam Vitals and nursing note reviewed.  Constitutional:      General: He is not in acute distress.    Appearance: Normal appearance. He is not ill-appearing.  HENT:     Head: Normocephalic and atraumatic.  Eyes:     General: No scleral icterus.       Right eye: No discharge.        Left eye: No discharge.     Extraocular Movements: Extraocular movements intact.     Conjunctiva/sclera: Conjunctivae normal.  Cardiovascular:     Rate and Rhythm: Normal rate and regular rhythm.     Pulses: Normal pulses.     Heart sounds: Normal heart sounds. No murmur heard.  No friction rub. No gallop.  Pulmonary:     Effort: Pulmonary effort is normal. No respiratory distress.     Breath sounds: Normal breath sounds.  Abdominal:     General: Abdomen is flat.     Palpations: Abdomen is soft.     Tenderness: There is no abdominal tenderness.  Musculoskeletal:     Cervical back: Normal range of motion and neck supple. No rigidity or tenderness.     Right lower leg: No edema.     Left lower leg: No edema.  Skin:    General: Skin is warm and dry.     Comments: Patient was noted to have a left-sided perianal abscess that was actively draining upon evaluation.  2.5 x 2.5 cm abscess area of fluctuance noted.  Neurological:     General: No focal deficit present.     Mental Status: He is alert and oriented to person, place, and  time. Mental status is at baseline.  Psychiatric:        Mood and Affect: Mood normal.     ED Results / Procedures / Treatments   Labs (all labs ordered are listed, but only abnormal results are displayed) Labs Reviewed  CBC WITH DIFFERENTIAL/PLATELET - Abnormal; Notable for the following components:      Result Value   WBC 14.1 (*)    Neutro Abs 11.4 (*)    All other components within normal limits  BASIC METABOLIC PANEL - Abnormal; Notable for the following components:   Glucose, Bld 157 (*)    All other components within normal limits  URINALYSIS, ROUTINE W REFLEX MICROSCOPIC - Abnormal; Notable for the following components:   APPearance HAZY (*)    Specific Gravity, Urine 1.035 (*)    Glucose, UA >=500 (*)    All other components within normal limits    EKG None  Radiology No results found.  Procedures .Incision and Drainage  Date/Time: 12/11/2023 3:16 PM  Performed by: Lunette Stands, PA-C Authorized by: Lunette Stands, PA-C   Consent:    Consent obtained:  Verbal   Consent given by:  Patient   Risks, benefits, and alternatives were discussed: yes     Risks discussed:  Bleeding, incomplete drainage and infection   Alternatives discussed:  No treatment and delayed treatment Universal protocol:    Relevant documents present and verified: yes     Test results available : yes     Imaging studies available: yes     Patient identity confirmed:  Verbally with patient and arm band Location:    Type:  Abscess   Size:  2.5   Location:  Anogenital   Anogenital location:  Perianal Pre-procedure details:    Skin preparation:  Povidone-iodine Sedation:    Sedation type:  None Anesthesia:    Anesthesia method:  Local infiltration   Local anesthetic:  Lidocaine 1% w/o epi Procedure type:    Complexity:  Simple Procedure details:    Ultrasound guidance: no     Needle aspiration: no     Incision types:  Stab incision   Wound management:  Probed and deloculated,  extensive cleaning and irrigated with saline   Drainage:  Bloody and purulent   Drainage amount:  Moderate   Wound treatment:  Wound left open   Packing materials:  None Post-procedure details:    Procedure completion:  Tolerated well, no immediate complications     Medications Ordered in ED Medications  lidocaine (PF) (XYLOCAINE) 1 % injection 10 mL (  has no administration in time range)    ED Course/ Medical Decision Making/ A&P                                 Medical Decision Making Amount and/or Complexity of Data Reviewed Labs: ordered.   This patient is a 48 year old male who presents to the ED for concern of perineal soreness, bleeding x 1 week.   Differential diagnoses prior to evaluation: The emergent differential diagnosis includes, but is not limited to, abscess, fissure, hemorrhoid, Fournier's gangrene, prostatitis, UTI. This is not an exhaustive differential.   Past Medical History / Co-morbidities / Social History: TIA, HTN, type 2 diabetes, prostate cancer with prostate seeds in place, GERD  Additional history: Chart reviewed. Pertinent results include:   CT on 11/30/2023 showed no acute abnormality in abdomen or pelvis but did show some mild bladder wall thickening.  Lab Tests/Imaging studies: I personally interpreted labs/imaging and the pertinent results include:   CBC was notable for elevated white count of 14.1 BMP was unremarkable UA was notable for specific gravity 1.035  CT abdomen pelvis pending   Medications: I ordered medication including cefadroxil.  I have reviewed the patients home medicines and have made adjustments as needed.  ED Course:  Patient is a 48 year old male presents the ED today complaining of perianal pain has been present for the last week.  Says that he was seen by UC on Wednesday and was told to come to the ED due to being unable to diagnose the issue.  Patient with previous history of prostate seeds as well as prostate  cancer.  Physical exam was remarkable for perianal abscess with notable drainage upon evaluation.  2.5 cm area of fluctuance noted.  I&D was then performed after CT ultrasound confirmed no free air present.  This amount of purulent fluid was noted to be drained.  Patient to be sent home on cefadroxil.  No packing was placed due to size of wound.  Was done to rule out for any gangrene due to having prostate seeds in place.  UA was also done to rule out any prostatitis or UTI.  Was unremarkable for infection.  Patient vitals remained stable at the course of his time here.      Disposition: 3:28 PM Care of Mark Anthony transferred to [facility] and at the end of my shift as the patient will require reassessment once labs/imaging have resulted. Patient presentation, ED course, and plan of care discussed with review of all pertinent labs and imaging. Please see his/her note for further details regarding further ED course and disposition. Plan at time of handoff is if CT is unremarkable, continue with discharge home with antibiotic and monitoring symptoms.. This may be altered or completely changed at the discretion of the oncoming team pending results of further workup.  Final Clinical Impression(s) / ED Diagnoses Final diagnoses:  Perianal abscess    Rx / DC Orders ED Discharge Orders          Ordered    cefadroxil (DURICEF) 500 MG capsule  Daily        12/11/23 1515              Lunette Stands, [place] 12/11/23 1529    Glendora Score, MD 12/11/23 [phone]

## 2023-12-11 NOTE — ED Triage Notes (Signed)
Patient is here for evaluation of bleeding and pain in between scrotum and rectum. Patient also reports burning sensation when having a bowel movement. Pt states that his pain seems to be in the area where his prostate seeds are. Patient denies any blood in stool or out of penis. Denies any pain in the penis.

## 2023-12-11 NOTE — Discharge Instructions (Addendum)
You were seen today for perianal abscess.  Continue to place gauze over it to absorb any drainage that may continue to leak out.  Be sure to keep the area clean with soap and water being sure to wash it daily.  As stated before this may recur so continue to monitor symptoms.  Be sure to take the antibiotic I prescribed for you and finish it for its full 7-day course.  If you begin to have any fevers, shortness of breath, worsening pain return to the ED for reevaluation.

## 2023-12-11 NOTE — ED Notes (Signed)
Pt aware of need for urine sample.

## 2023-12-11 NOTE — ED Provider Triage Note (Addendum)
Emergency Medicine Provider Triage Evaluation Note  Mark Anthony , a 48 y.o. male  was evaluated in triage.  Pt complains of perineal pain and swelling 1 week with bleeding started yesterday. Seen at UC 2 days ago, who were unsure of Dx. States it feels "sore." Cannot see PCP until Thursday. Denies trauma to area. Believes this all happened after abdomen/pelvic CT done on 2/3.   Denies fevers, chills, chest pain, shortness of breath, abdominal pain, n/v/d, constipation, straining, dysuria, LE swelling.    Review of Systems  Positive: N/a Negative: N/a  Physical Exam  BP (!) 169/99 (BP Location: Right Arm)   Pulse (!) 107   Temp 97.9 F (36.6 C) (Oral)   Resp 18   Ht 6\' 1"  (1.854 m)   Wt 115.7 kg   SpO2 100%   BMI 33.65 kg/m  Gen:   Awake, no distress   Resp:  Normal effort  MSK:   Moves extremities without difficulty  Other:    Medical Decision Making  Medically screening exam initiated at 11:10 AM.  Appropriate orders placed.  Red Mandt was informed that the remainder of the evaluation will be completed by another provider, this initial triage assessment does not replace that evaluation, and the importance of remaining in the ED until their evaluation is complete.     Lunette Stands, PA-C 12/11/23 1115    Lunette Stands, [place] 12/11/23 1118

## 2023-12-21 ENCOUNTER — Encounter (HOSPITAL_COMMUNITY): Payer: Self-pay

## 2023-12-21 ENCOUNTER — Other Ambulatory Visit: Payer: Self-pay

## 2023-12-21 ENCOUNTER — Encounter (HOSPITAL_COMMUNITY)
Admission: RE | Admit: 2023-12-21 | Discharge: 2023-12-21 | Disposition: A | Discharge status: Home / Self Care | Payer: No Typology Code available for payment source | Source: Ambulatory Visit | Attending: Gastroenterology | Admitting: Gastroenterology

## 2023-12-21 HISTORY — DX: Polyneuropathy, unspecified: G62.9

## 2023-12-23 NOTE — Anesthesia Preprocedure Evaluation (Signed)
 Anesthesia Evaluation    Reviewed: Allergy & Precautions, Patient's Chart, lab work & pertinent test results  Airway Mallampati: III  TM Distance: >3 FB Neck ROM: Full    Dental no notable dental hx. (+) Teeth Intact, Dental Advisory Given   Pulmonary sleep apnea , Patient abstained from smoking., former smoker   Pulmonary exam normal breath sounds clear to auscultation       Cardiovascular hypertension, Pt. on medications Normal cardiovascular exam Rhythm:Regular Rate:Normal     Neuro/Psych   Anxiety     Neuropathy. sciatica TIA Neuromuscular disease    GI/Hepatic Neg liver ROS,GERD  ,,  Endo/Other  diabetes, Type 2    Renal/GU negative Renal ROS     Musculoskeletal  (+) Arthritis ,    Abdominal  (+) + obese  Peds  Hematology   Anesthesia Other Findings Prostate cancer  Reproductive/Obstetrics                             Anesthesia Physical Anesthesia Plan  ASA: 3  Anesthesia Plan: General   Post-op Pain Management: Minimal or no pain anticipated   Induction: Intravenous  PONV Risk Score and Plan: Propofol infusion  Airway Management Planned: Nasal Cannula and Natural Airway  Additional Equipment: None  Intra-op Plan:   Post-operative Plan:   Informed Consent: I have reviewed the patients History and Physical, chart, labs and discussed the procedure including the risks, benefits and alternatives for the proposed anesthesia with the patient or authorized representative who has indicated his/her understanding and acceptance.     Dental advisory given  Plan Discussed with: CRNA  Anesthesia Plan Comments:         Anesthesia Quick Evaluation

## 2023-12-24 ENCOUNTER — Encounter (HOSPITAL_COMMUNITY)
Admission: RE | Disposition: A | Discharge status: Home / Self Care | Payer: Self-pay | Source: Home / Self Care | Attending: Gastroenterology

## 2023-12-24 ENCOUNTER — Ambulatory Visit (HOSPITAL_COMMUNITY)
Admission: RE | Admit: 2023-12-24 | Discharge: 2023-12-24 | Disposition: A | Discharge status: Home / Self Care | Payer: No Typology Code available for payment source | Attending: Gastroenterology | Admitting: Gastroenterology

## 2023-12-24 ENCOUNTER — Encounter (HOSPITAL_COMMUNITY): Payer: Self-pay | Admitting: Gastroenterology

## 2023-12-24 ENCOUNTER — Ambulatory Visit (HOSPITAL_BASED_OUTPATIENT_CLINIC_OR_DEPARTMENT_OTHER): Payer: No Typology Code available for payment source | Admitting: Anesthesiology

## 2023-12-24 ENCOUNTER — Ambulatory Visit (HOSPITAL_COMMUNITY): Payer: No Typology Code available for payment source | Admitting: Anesthesiology

## 2023-12-24 DIAGNOSIS — G4733 Obstructive sleep apnea (adult) (pediatric): Secondary | ICD-10-CM | POA: Insufficient documentation

## 2023-12-24 DIAGNOSIS — D124 Benign neoplasm of descending colon: Secondary | ICD-10-CM | POA: Insufficient documentation

## 2023-12-24 DIAGNOSIS — Z923 Personal history of irradiation: Secondary | ICD-10-CM | POA: Insufficient documentation

## 2023-12-24 DIAGNOSIS — K635 Polyp of colon: Secondary | ICD-10-CM | POA: Insufficient documentation

## 2023-12-24 DIAGNOSIS — E1140 Type 2 diabetes mellitus with diabetic neuropathy, unspecified: Secondary | ICD-10-CM | POA: Diagnosis not present

## 2023-12-24 DIAGNOSIS — K644 Residual hemorrhoidal skin tags: Secondary | ICD-10-CM | POA: Diagnosis not present

## 2023-12-24 DIAGNOSIS — D125 Benign neoplasm of sigmoid colon: Secondary | ICD-10-CM | POA: Insufficient documentation

## 2023-12-24 DIAGNOSIS — K3189 Other diseases of stomach and duodenum: Secondary | ICD-10-CM | POA: Diagnosis not present

## 2023-12-24 DIAGNOSIS — Z1211 Encounter for screening for malignant neoplasm of colon: Secondary | ICD-10-CM | POA: Insufficient documentation

## 2023-12-24 DIAGNOSIS — I10 Essential (primary) hypertension: Secondary | ICD-10-CM

## 2023-12-24 DIAGNOSIS — R634 Abnormal weight loss: Secondary | ICD-10-CM | POA: Insufficient documentation

## 2023-12-24 DIAGNOSIS — R12 Heartburn: Secondary | ICD-10-CM | POA: Insufficient documentation

## 2023-12-24 DIAGNOSIS — D122 Benign neoplasm of ascending colon: Secondary | ICD-10-CM

## 2023-12-24 DIAGNOSIS — K219 Gastro-esophageal reflux disease without esophagitis: Secondary | ICD-10-CM | POA: Diagnosis not present

## 2023-12-24 DIAGNOSIS — Z7984 Long term (current) use of oral hypoglycemic drugs: Secondary | ICD-10-CM | POA: Insufficient documentation

## 2023-12-24 DIAGNOSIS — Z8673 Personal history of transient ischemic attack (TIA), and cerebral infarction without residual deficits: Secondary | ICD-10-CM | POA: Diagnosis not present

## 2023-12-24 DIAGNOSIS — Z9889 Other specified postprocedural states: Secondary | ICD-10-CM | POA: Insufficient documentation

## 2023-12-24 DIAGNOSIS — Z8546 Personal history of malignant neoplasm of prostate: Secondary | ICD-10-CM | POA: Diagnosis not present

## 2023-12-24 DIAGNOSIS — Z6833 Body mass index (BMI) 33.0-33.9, adult: Secondary | ICD-10-CM | POA: Diagnosis not present

## 2023-12-24 DIAGNOSIS — K6289 Other specified diseases of anus and rectum: Secondary | ICD-10-CM | POA: Insufficient documentation

## 2023-12-24 DIAGNOSIS — K648 Other hemorrhoids: Secondary | ICD-10-CM | POA: Insufficient documentation

## 2023-12-24 DIAGNOSIS — K2970 Gastritis, unspecified, without bleeding: Secondary | ICD-10-CM

## 2023-12-24 DIAGNOSIS — Z9221 Personal history of antineoplastic chemotherapy: Secondary | ICD-10-CM | POA: Insufficient documentation

## 2023-12-24 DIAGNOSIS — E669 Obesity, unspecified: Secondary | ICD-10-CM | POA: Diagnosis not present

## 2023-12-24 DIAGNOSIS — Z800 Family history of malignant neoplasm of digestive organs: Secondary | ICD-10-CM | POA: Diagnosis not present

## 2023-12-24 DIAGNOSIS — E0800 Diabetes mellitus due to underlying condition with hyperosmolarity without nonketotic hyperglycemic-hyperosmolar coma (NKHHC): Secondary | ICD-10-CM

## 2023-12-24 HISTORY — PX: ESOPHAGOGASTRODUODENOSCOPY (EGD) WITH PROPOFOL: SHX5813

## 2023-12-24 HISTORY — PX: COLONOSCOPY WITH PROPOFOL: SHX5780

## 2023-12-24 HISTORY — PX: BIOPSY: SHX5522

## 2023-12-24 HISTORY — PX: POLYPECTOMY: SHX5525

## 2023-12-24 LAB — GLUCOSE, CAPILLARY: Glucose-Capillary: 105 mg/dL — ABNORMAL HIGH (ref 70–99)

## 2023-12-24 LAB — HM COLONOSCOPY

## 2023-12-24 SURGERY — COLONOSCOPY WITH PROPOFOL
Anesthesia: General

## 2023-12-24 MED ORDER — PROPOFOL 500 MG/50ML IV EMUL
INTRAVENOUS | Status: DC | PRN
  Administered 2023-12-24: 200 ug/kg/min via INTRAVENOUS

## 2023-12-24 MED ORDER — SODIUM CHLORIDE 0.9% FLUSH: 3.0000 mL | Freq: Two times a day (BID) | INTRAVENOUS | Status: DC

## 2023-12-24 MED ORDER — LACTATED RINGERS IV SOLN: INTRAVENOUS | Status: DC | PRN

## 2023-12-24 MED ORDER — PROPOFOL 10 MG/ML IV BOLUS
INTRAVENOUS | Status: DC | PRN
  Administered 2023-12-24: 50 mg via INTRAVENOUS
  Administered 2023-12-24: 100 mg via INTRAVENOUS
  Administered 2023-12-24: 70 mg via INTRAVENOUS

## 2023-12-24 MED ORDER — STERILE WATER FOR IRRIGATION IR SOLN
Status: DC | PRN
  Administered 2023-12-24: 120 mL

## 2023-12-24 MED ORDER — DEXMEDETOMIDINE HCL IN NACL 80 MCG/20ML IV SOLN
INTRAVENOUS | Status: AC
  Filled 2023-12-24: qty 20

## 2023-12-24 MED ORDER — LIDOCAINE HCL (PF) 2 % IJ SOLN
INTRAMUSCULAR | Status: DC | PRN
  Administered 2023-12-24: 100 mg via INTRADERMAL

## 2023-12-24 MED ORDER — SODIUM CHLORIDE 0.9% FLUSH: 3.0000 mL | INTRAVENOUS | Status: DC | PRN

## 2023-12-24 MED ORDER — DEXMEDETOMIDINE HCL IN NACL 80 MCG/20ML IV SOLN
INTRAVENOUS | Status: DC | PRN
  Administered 2023-12-24: 8 ug via INTRAVENOUS

## 2023-12-24 NOTE — Discharge Instructions (Addendum)

## 2023-12-24 NOTE — H&P (Signed)
 Primary Care Physician:  [facility] Primary Gastroenterologist:  Dr. Tasia Catchings  Pre-Procedure History & Physical: HPI:  Mark Anthony is a 48 y.o. male with type 2 diabetes, hypertension, obesity, TIA x2 2019 2020 , prostate CA status post radiation and chemo , OSA not on CPAP , GERD status post ? Fundoplication in early 2000's who presents for evaluation of abnormal weight loss, GERD  and colon cancer screening.   Patient reports that last year he was diagnosed with diabetes and hemoglobin A1c of 14.  He reports he was started on Jardiance and last A1c now is 6.0.  Patient has a good appetite but reports since December he has lost 20 pounds as his baseline weight is 270.  Although he attributes his weight loss to diabetes he is concerned as this is considered unintentional weight lossThe patient denies having any nausea, vomiting, fever, chills, hematochezia, melena, hematemesis, abdominal distention, abdominal pain, diarrhea, jaundice, pruritus.  Patient reports many years ago he had horrible heartburn for which she underwent surgery and since that his symptoms have resolved   Last EGD:20 years ago  Last Colonoscopy: none   FHx: MOTHER COLON CANCER as per patient today Social: neg smoking, alcohol or illicit drug use Surgical: no abdominal surgeries   Recent ct without contrast negative   Past Medical History:  Diagnosis Date   Anxiety    Arthritis    knees, gets right knee injection q 4 months   Chronic low back pain with left-sided sciatica    Diabetes mellitus without complication (HCC)    diet controlled does not check cbg at home, last hemaglobin a1c was 7  in june 2023 per pt   Hypertension    Neuropathy    Prostate cancer (HCC)    dx june 2023   Reflux    Sleep apnea    osa sleep study done  06-27-2022 no cpap yet per pt on 07-07-2022   TIA (transient ischemic attack)    1 and 1/2 yrs ago per pt on 07-07-2022  sees pcp dr  s Newt [facility] at [facility]   TIA (transient  ischemic attack)    Wears glasses     Past Surgical History:  Procedure Laterality Date   ACHILLES TENDON SURGERY Right 02/02/2020   Procedure: Right Achilles tendon debridement and reconstruction;  Surgeon: Toni Arthurs, MD;  Location: [facility];  Service: Orthopedics;  Laterality: Right;   ACHILLES TENDON SURGERY Right 12/13/2020   Procedure: Right Achilles Tendon Reconstruction;  Surgeon: Toni Arthurs, MD;  Location: [facility];  Service: Orthopedics;  Laterality: Right;   CYSTOSCOPY  07/24/2022   Procedure: CYSTOSCOPY;  Surgeon: Malen Gauze, MD;  Location: [facility];  Service: Urology;;   ELBOW FRACTURE SURGERY Left    yrs ago   GASTRIC FUNDOPLICATION     yrs ago   GASTROC RECESSION EXTREMITY Right 12/13/2020   Procedure: Gastroc Recession;  Surgeon: Toni Arthurs, MD;  Location: [facility];  Service: Orthopedics;  Laterality: Right;   RADIOACTIVE SEED IMPLANT N/A 07/24/2022   Procedure: RADIOACTIVE SEED IMPLANT/BRACHYTHERAPY IMPLANT;  Surgeon: Malen Gauze, MD;  Location: [facility];  Service: Urology;  Laterality: N/A;   right thumb surgery     yrs ago   SPACE OAR INSTILLATION N/A 07/24/2022   Procedure: SPACE OAR INSTILLATION;  Surgeon: Malen Gauze, MD;  Location: [facility];  Service: Urology;  Laterality: N/A;    Prior to Admission medications  Medication Sig Start Date End Date Taking? Authorizing Provider  abiraterone acetate (ZYTIGA) 250 MG tablet Take 1,000 mg by mouth daily. Take on an empty stomach 1 hour before or 2 hours after a meal   Yes [provider]  amLODipine (NORVASC) 10 MG tablet Take 1 tablet (10 mg total) by mouth daily. 05/24/21 12/24/23 Yes Emokpae, Courage, MD  atorvastatin (LIPITOR) 10 MG tablet Take 10 mg by mouth daily.   Yes [provider]  cetirizine (ZYRTEC) 10 MG tablet  02/27/22  Yes [provider]   Empagliflozin-metFORMIN HCl ER 25-1000 MG TB24 Take 1,000 tablets by mouth once.   Yes [provider]  meloxicam (MOBIC) 7.5 MG tablet TAKE ONE TABLET BY MOUTH TWICE A DAY AS NEEDED FOR SEVERE PAIN 02/19/22  Yes [provider]  methocarbamol (ROBAXIN) 500 MG tablet methocarbamol 500 mg tablet  Take 1 tablet every 8 hours by oral route as needed for spasm for 10 days.   Yes [provider]  predniSONE (STERAPRED UNI-PAK 21 TAB) 10 MG (21) TBPK tablet Take by mouth daily. Take as directed. 02/24/23  Yes Mardella Layman, MD  silodosin (RAPAFLO) 8 MG CAPS capsule Take 1 capsule (8 mg total) by mouth daily with breakfast. 09/22/22  Yes McKenzie, Mardene Celeste, MD    Allergies as of 11/17/2023 - Review Complete 11/17/2023  Allergen Reaction Noted   Contrast media [iodinated contrast media] Nausea And Vomiting and Swelling 05/24/2021   Acetaminophen  07/10/2006   Vicodin [hydrocodone-acetaminophen] Nausea And Vomiting 04/12/2016    Family History  Problem Relation Age of Onset   Prostate cancer Father     Social History   Socioeconomic History   Marital status: Married    Spouse name: Not on file   Number of children: Not on file   Years of education: Not on file   Highest education level: Not on file  Occupational History   Not on file  Tobacco Use   Smoking status: Former    Current packs/day: 0.00    Average packs/day: 1 pack/day for 26.0 years (26.0 ttl pk-yrs)    Types: Cigarettes, Cigars    Start date: 25    Quit date: 2021    Years since quitting: 4.1    Passive exposure: Past   Smokeless tobacco: Never   Tobacco comments:    Smokes 2 to 3 cigars per day now per pt on 07-07-2022  Vaping Use   Vaping status: Never Used  Substance and Sexual Activity   Alcohol use: Not Currently   Drug use: No   Sexual activity: Yes  Other Topics Concern   Not on file  Social History Narrative   Not on file   Social Drivers of Health   Financial Resource  Strain: Not on file  Food Insecurity: No Food Insecurity (08/13/2023)   Hunger Vital Sign    Worried About Running Out of Food in the Last Year: Never true    Ran Out of Food in the Last Year: Never true  Transportation Needs: Not on file  Physical Activity: Not on file  Stress: Not on file  Social Connections: Not on file  Intimate Partner Violence: Not on file    Review of Systems: See HPI, otherwise negative ROS  Physical Exam: Vital signs in last 24 hours: Temp:  [98.8 F (37.1 C)] 98.8 F (37.1 C) (02/27 0651) Pulse Rate:  [68] 68 (02/27 0651) Resp:  [14] 14 (02/27 0651) BP: (143)/(88) 143/88 (02/27 0651) SpO2:  [  100 %] 100 % (02/27 0651) Weight:  [115.7 kg] 115.7 kg (02/27 0651)   General:   Alert,  Well-developed, well-nourished, pleasant and cooperative in NAD Head:  Normocephalic and atraumatic. Eyes:  Sclera clear, no icterus.   Conjunctiva pink. Ears:  Normal auditory acuity. Nose:  No deformity, discharge,  or lesions. Msk:  Symmetrical without gross deformities. Normal posture. Extremities:  Without clubbing or edema. Neurologic:  Alert and  oriented x4;  grossly normal neurologically. Skin:  Intact without significant lesions or rashes. Psych:  Alert and cooperative. Normal mood and affect.  Impression/Plan: Mark Anthony is a 48 y.o. male with type 2 diabetes, hypertension, obesity, TIA x2 2019 2020 , prostate CA status post radiation and chemo , OSA not on CPAP , GERD status post ? Fundoplication in early 2000's who presents for evaluation of abnormal weight loss, GERD  and colon cancer screening. Proceed with EGD and Colonoscopy.  The risks of the procedure including infection, bleed, or perforation as well as benefits, limitations, alternatives and imponderables have been reviewed with the patient. Questions have been answered. All parties agreeable.

## 2023-12-24 NOTE — Transfer of Care (Signed)
 Immediate Anesthesia Transfer of Care Note  Patient: Mark Anthony  Procedure(s) Performed: COLONOSCOPY WITH PROPOFOL ESOPHAGOGASTRODUODENOSCOPY (EGD) WITH PROPOFOL BIOPSY POLYPECTOMY  Patient Location: Short Stay  Anesthesia Type:General  Level of Consciousness: awake, alert , and oriented  Airway & Oxygen Therapy: Patient Spontanous Breathing  Post-op Assessment: Report given to RN and Post -op Vital signs reviewed and stable  Post vital signs: Reviewed and stable  Last Vitals:  Vitals Value Taken Time  BP    Temp    Pulse    Resp    SpO2      Last Pain:  Vitals:   12/24/23 0733  TempSrc:   PainSc: 0-No pain         Complications: No notable events documented.

## 2023-12-24 NOTE — Op Note (Signed)
 [facility] Patient Name: Mark Anthony Procedure Date: 12/24/2023 7:08 AM MRN: [id number] Date of Birth: 1976-03-02 Attending MD: Sanjuan Dame , MD, [id number] CSN: [id number] Age: 48 Admit Type: Outpatient Procedure:                Upper GI endoscopy Indications:              Heartburn, Weight loss Providers:                Sanjuan Dame, MD, Francoise Ceo RN, RN, Dyann Ruddle Referring MD:              Medicines:                Monitored Anesthesia Care Complications:            No immediate complications. Estimated Blood Loss:     Estimated blood loss: none. Procedure:                Pre-Anesthesia Assessment:                           - Prior to the procedure, a History and Physical                            was performed, and patient medications and                            allergies were reviewed. The patient's tolerance of                            previous anesthesia was also reviewed. The risks                            and benefits of the procedure and the sedation                            options and risks were discussed with the patient.                            All questions were answered, and informed consent                            was obtained. Prior Anticoagulants: The patient has                            taken no anticoagulant or antiplatelet agents. ASA                            Grade Assessment: III - A patient with severe                            systemic disease. After reviewing the risks and                            benefits, the patient was deemed in satisfactory  condition to undergo the procedure.                           After obtaining informed consent, the endoscope was                            passed under direct vision. Throughout the                            procedure, the patient's blood pressure, pulse, and                            oxygen saturations were monitored continuously. The                             [id number]-[id number] ([id number]) scope was introduced through the                            mouth, and advanced to the second part of duodenum.                            The upper GI endoscopy was accomplished without                            difficulty. The patient tolerated the procedure                            well. Scope In: 7:39:38 AM Scope Out: 7:43:10 AM Total Procedure Duration: 0 hours 3 minutes 32 seconds  Findings:      Evidence of a fundoplication was found at the gastroesophageal junction.       The wrap appeared intact. This was traversed.      There is no endoscopic evidence of esophagitis in the entire esophagus.      Mildly erythematous mucosa without bleeding was found in the gastric       antrum. Biopsies were taken with a cold forceps for histology.      The duodenal bulb and second portion of the duodenum were normal. Impression:               - A fundoplication was found. The wrap appears                            intact.                           - Erythematous mucosa in the antrum. Biopsied.                           - Normal duodenal bulb and second portion of the                            duodenum. Moderate Sedation:      Per Anesthesia Care Recommendation:           - Patient has a contact number available for  emergencies. The signs and symptoms of potential                            delayed complications were discussed with the                            patient. Return to normal activities tomorrow.                            Written discharge instructions were provided to the                            patient.                           - Resume previous diet.                           - Continue present medications.                           - Await pathology results. Procedure Code(s):        --- Professional ---                           [phone], Esophagogastroduodenoscopy, flexible,                             transoral; with biopsy, single or multiple Diagnosis Code(s):        --- Professional ---                           [phone], Other specified postprocedural states                           K31.89, Other diseases of stomach and duodenum                           R12, Heartburn                           R63.4, Abnormal weight loss CPT copyright 2022 American Medical Association. All rights reserved. The codes documented in this report are preliminary and upon coder review may  be revised to meet current compliance requirements. Sanjuan Dame, MD Sanjuan Dame, MD 12/24/2023 7:47:14 AM This report has been signed electronically. Number of Addenda: 0

## 2023-12-24 NOTE — Anesthesia Postprocedure Evaluation (Signed)
 Anesthesia Post Note  Patient: Vallen Calabrese  Procedure(s) Performed: COLONOSCOPY WITH PROPOFOL ESOPHAGOGASTRODUODENOSCOPY (EGD) WITH PROPOFOL BIOPSY POLYPECTOMY  Patient location during evaluation: PACU Anesthesia Type: General Level of consciousness: awake and alert Pain management: pain level controlled Vital Signs Assessment: post-procedure vital signs reviewed and stable Respiratory status: spontaneous breathing, nonlabored ventilation, respiratory function stable and patient connected to nasal cannula oxygen Cardiovascular status: blood pressure returned to baseline and stable Postop Assessment: no apparent nausea or vomiting Anesthetic complications: no   There were no known notable events for this encounter.   Last Vitals:  Vitals:   12/24/23 0651 12/24/23 0822  BP: (!) 143/88 (!) 146/98  Pulse: 68 79  Resp: 14 (!) 22  Temp: 37.1 C 36.6 C  SpO2: 100% 100%    Last Pain:  Vitals:   12/24/23 0822  TempSrc: Oral  PainSc: 0-No pain                 Jahnia Hewes L Arliss Hepburn

## 2023-12-24 NOTE — Op Note (Signed)
 [facility] Patient Name: Mark Anthony Procedure Date: 12/24/2023 7:05 AM MRN: [id number] Date of Birth: [birth year] Attending MD: Sanjuan Dame , MD, [id number] CSN: [id number] Age: 48 Admit Type: Outpatient Procedure:                Colonoscopy Indications:              Colon cancer screening in patient at increased                            risk: Colorectal cancer in mother Providers:                Sanjuan Dame, MD, Francoise Ceo RN, RN, Dyann Ruddle Referring MD:              Medicines:                Monitored Anesthesia Care Complications:            No immediate complications. Estimated Blood Loss:     Estimated blood loss: none. Procedure:                Pre-Anesthesia Assessment:                           - Prior to the procedure, a History and Physical                            was performed, and patient medications and                            allergies were reviewed. The patient's tolerance of                            previous anesthesia was also reviewed. The risks                            and benefits of the procedure and the sedation                            options and risks were discussed with the patient.                            All questions were answered, and informed consent                            was obtained. Prior Anticoagulants: The patient has                            taken no anticoagulant or antiplatelet agents. ASA                            Grade Assessment: III - A patient with severe                            systemic disease. After reviewing the risks and  benefits, the patient was deemed in satisfactory                            condition to undergo the procedure.                           After obtaining informed consent, the colonoscope                            was passed under direct vision. Throughout the                            procedure, the patient's blood pressure, pulse, and                             oxygen saturations were monitored continuously. The                            [id number]) scope was introduced through the                            anus and advanced to the the cecum, identified by                            appendiceal orifice and ileocecal valve. The                            colonoscopy was performed without difficulty. The                            patient tolerated the procedure well. The quality                            of the bowel preparation was evaluated using the                            BBPS [facility] Bowel Preparation Scale) with scores                            of: Right Colon = 3, Transverse Colon = 3 and Left                            Colon = 3 (entire mucosa seen well with no residual                            staining, small fragments of stool or opaque                            liquid). The total BBPS score equals 9. The                            ileocecal valve, appendiceal orifice, and rectum  were photographed. Scope In: 7:48:35 AM Scope Out: 8:17:08 AM Scope Withdrawal Time: 0 hours 26 minutes 0 seconds  Total Procedure Duration: 0 hours 28 minutes 33 seconds  Findings:      The perianal and digital rectal examinations were normal.      Three sessile polyps were found in the sigmoid colon and descending       colon. The polyps were 4 to 9 mm in size. These polyps were removed with       a hot snare. Resection and retrieval were complete.      Three sessile polyps were found in the sigmoid colon and descending       colon. The polyps were 5 to 8 mm in size. These polyps were removed with       a cold snare. Resection and retrieval were complete.      Non-bleeding external and internal hemorrhoids were found during       retroflexion. The hemorrhoids were small.      Anal papilla(e) were hypertrophied. Impression:               - Three 4 to 9 mm polyps in the sigmoid colon and                             in the descending colon, removed with a hot snare.                            Resected and retrieved.                           - Three 5 to 8 mm polyps in the sigmoid colon and                            in the descending colon, removed with a cold snare.                            Resected and retrieved.                           - Non-bleeding external and internal hemorrhoids.                           - Anal papilla(e) were hypertrophied. Moderate Sedation:      Per Anesthesia Care Recommendation:           - Patient has a contact number available for                            emergencies. The signs and symptoms of potential                            delayed complications were discussed with the                            patient. Return to normal activities tomorrow.                            Written discharge instructions were  provided to the                            patient.                           - Resume previous diet.                           - Continue present medications.                           - Await pathology results.                           - Repeat colonoscopy in 3 - 5 years for                            surveillance.                           - Return to primary care physician as previously                            scheduled. Procedure Code(s):        --- Professional ---                           [phone], Colonoscopy, flexible; with removal of                            tumor(s), polyp(s), or other lesion(s) by snare                            technique Diagnosis Code(s):        --- Professional ---                           Z80.0, Family history of malignant neoplasm of                            digestive organs                           D12.5, Benign neoplasm of sigmoid colon                           D12.4, Benign neoplasm of descending colon                           K62.89, Other specified diseases of anus and rectum                            K64.8, Other hemorrhoids CPT copyright 2022 American Medical Association. All rights reserved. The codes documented in this report are preliminary and upon coder review may  be revised to meet current compliance requirements. Sanjuan Dame, MD Sanjuan Dame, MD 12/24/2023 8:23:50 AM This report has been signed electronically. Number  of Addenda: 0

## 2023-12-24 NOTE — Anesthesia Procedure Notes (Addendum)
 Date/Time: 12/24/2023 7:33 AM  Performed by: Julian Reil, CRNAPre-anesthesia Checklist: Patient identified, Emergency Drugs available, Suction available and Patient being monitored Patient Re-evaluated:Patient Re-evaluated prior to induction Oxygen Delivery Method: Nasal cannula Induction Type: IV induction Placement Confirmation: positive ETCO2 Comments: Optiflow High Flow Anthony O2 used.

## 2023-12-25 ENCOUNTER — Encounter (INDEPENDENT_AMBULATORY_CARE_PROVIDER_SITE_OTHER): Payer: Self-pay | Admitting: *Deleted

## 2023-12-25 ENCOUNTER — Encounter (HOSPITAL_COMMUNITY): Payer: Self-pay | Admitting: Gastroenterology

## 2023-12-25 LAB — SURGICAL PATHOLOGY

## 2023-12-28 NOTE — Progress Notes (Signed)
 I reviewed the pathology results. Ann, can you send her a letter with the findings as described below please?  Repeat colonoscopy in 5 years  Thanks,  [name], MD Gastroenterology and Hepatology [facility]  ---------------------------------------------------------------------------------------------  [facility] [address], [place], [place] [address] Phone:  [phone]   12/28/23 [name], [place]   Dear Mark Anthony,  I am writing to let you know the results of your recent colonoscopy.  You had a total of 6 polyps removed. The pathology came back as "sessile serrated" and "hyperplastic" polyp. These findings are NOT cancer, but had the sessile serrated polyps remained in your colon, they could have turn into cancer.  Given these findings, it is recommended that your next colonoscopy be performed in 5 years.  Also upper endoscopy with  No H. Pylori bacteria in stomach , or any early cancer changes to the stomach mucosa ( Intestinal metaplasia) . This is good news   Also I value your feedback , so if you get a survey , please take the time to fill it out and thank you for choosing [facility]  Please call us at [phone] if you have persistent problems or have questions about your condition that have not been fully answered at this time.  Sincerely,  [name], MD Gastroenterology and Hepatology

## 2023-12-30 ENCOUNTER — Encounter (INDEPENDENT_AMBULATORY_CARE_PROVIDER_SITE_OTHER): Payer: Self-pay | Admitting: *Deleted

## 2024-05-13 ENCOUNTER — Encounter: Payer: Self-pay | Admitting: Advanced Practice Midwife

## 2024-05-13 ENCOUNTER — Other Ambulatory Visit: Payer: Non-veteran care

## 2024-05-20 ENCOUNTER — Ambulatory Visit: Payer: Non-veteran care | Admitting: Urology

## 2024-06-22 ENCOUNTER — Ambulatory Visit (INDEPENDENT_AMBULATORY_CARE_PROVIDER_SITE_OTHER): Admitting: Urology

## 2024-06-22 ENCOUNTER — Encounter: Payer: Self-pay | Admitting: Urology

## 2024-06-22 VITALS — BP 143/89 | HR 101

## 2024-06-22 DIAGNOSIS — C61 Malignant neoplasm of prostate: Secondary | ICD-10-CM

## 2024-06-22 DIAGNOSIS — N3281 Overactive bladder: Secondary | ICD-10-CM | POA: Diagnosis not present

## 2024-06-22 DIAGNOSIS — R350 Frequency of micturition: Secondary | ICD-10-CM | POA: Diagnosis not present

## 2024-06-22 DIAGNOSIS — R351 Nocturia: Secondary | ICD-10-CM

## 2024-06-22 LAB — BLADDER SCAN AMB NON-IMAGING: Scan Result: 69

## 2024-06-22 MED ORDER — TROSPIUM CHLORIDE ER 60 MG PO CP24: 1.0000 | ORAL_CAPSULE | Freq: Every day | ORAL | 11 refills | Status: AC

## 2024-06-22 MED ORDER — SILODOSIN 8 MG PO CAPS: 8.0000 mg | ORAL_CAPSULE | Freq: Every day | ORAL | 11 refills | Status: AC

## 2024-06-22 NOTE — Progress Notes (Signed)
 Bladder Scan completed today.  Patient can void prior to the bladder scan. Bladder scan result: 69  Performed By: Kaikoa Magro Lpn  Additional notes-

## 2024-06-22 NOTE — Progress Notes (Signed)
 06/22/2024 10:59 AM   Mark Anthony 12-17-1975 [id number]  Referring provider: Toni Marshall SQUIBB, NP [address] [place],  [place] [address]  Overactive bladder   HPI: Mark Anthony is a 48yo here for followup for prostate cancer, BPH and OAB. He is currently off xytiga. PSA undetectable. He remains on rapaflo  8mg  daily and trospium  20mg  BID. He continues to have  urinary frequency every 1-2 hours. Urine stream strong. No straining to urinate. IPSS 12 QOl 2.    PMH: Past Medical History:  Diagnosis Date   Anxiety    Arthritis    knees, gets right knee injection q 4 months   Chronic low back pain with left-sided sciatica    Diabetes mellitus without complication (HCC)    diet controlled does not check cbg at home, last hemaglobin a1c was 7  in june 2023 per pt   Hypertension    Neuropathy    Prostate cancer [facility])    dx june 2023   Reflux    Sleep apnea    osa sleep study done  06-27-2022 no cpap yet per pt on 07-07-2022   TIA (transient ischemic attack)    1 and 1/2 yrs ago per pt on 07-07-2022  sees pcp dr  s lucienne at [facility]   TIA (transient ischemic attack)    Wears glasses     Surgical History: Past Surgical History:  Procedure Laterality Date   ACHILLES TENDON SURGERY Right 02/02/2020   Procedure: Right Achilles tendon debridement and reconstruction;  Surgeon: Kit Rush, MD;  Location: [facility];  Service: Orthopedics;  Laterality: Right;   ACHILLES TENDON SURGERY Right 12/13/2020   Procedure: Right Achilles Tendon Reconstruction;  Surgeon: Kit Rush, MD;  Location: [facility];  Service: Orthopedics;  Laterality: Right;   BIOPSY  12/24/2023   Procedure: BIOPSY;  Surgeon: Cinderella Deatrice FALCON, MD;  [facility];  Service: Endoscopy;;   COLONOSCOPY WITH PROPOFOL  N/A 12/24/2023   Procedure: COLONOSCOPY WITH PROPOFOL ;  Surgeon: Cinderella Deatrice FALCON, MD;  [facility];  Service: Endoscopy;  Laterality: N/A;  7:30AM;ASA 1-2    CYSTOSCOPY  07/24/2022   Procedure: CYSTOSCOPY;  Surgeon: Sherrilee Belvie CROME, MD;  Location: [facility];  Service: Urology;;   ELBOW FRACTURE SURGERY Left    yrs ago   ESOPHAGOGASTRODUODENOSCOPY (EGD) WITH PROPOFOL  N/A 12/24/2023   Procedure: ESOPHAGOGASTRODUODENOSCOPY (EGD) WITH PROPOFOL ;  Surgeon: Cinderella Deatrice FALCON, MD;  [facility];  Service: Endoscopy;  Laterality: N/A;  7:30AM;ASA 1-2   GASTRIC FUNDOPLICATION     yrs ago   GASTROC RECESSION EXTREMITY Right 12/13/2020   Procedure: Gastroc Recession;  Surgeon: Kit Rush, MD;  Location: [facility];  Service: Orthopedics;  Laterality: Right;   POLYPECTOMY  12/24/2023   Procedure: POLYPECTOMY;  Surgeon: Cinderella Deatrice FALCON, MD;  [facility];  Service: Endoscopy;;   RADIOACTIVE SEED IMPLANT N/A 07/24/2022   Procedure: RADIOACTIVE SEED IMPLANT/BRACHYTHERAPY IMPLANT;  Surgeon: Sherrilee Belvie CROME, MD;  Location: [facility];  Service: Urology;  Laterality: N/A;   right thumb surgery     yrs ago   SPACE OAR INSTILLATION N/A 07/24/2022   Procedure: SPACE OAR INSTILLATION;  Surgeon: Sherrilee Belvie CROME, MD;  Location: [facility];  Service: Urology;  Laterality: N/A;    Home Medications:  Allergies as of 06/22/2024       Reactions   Contrast Media [iodinated Contrast Media] Nausea And Vomiting, Swelling  Pt. Vomited immediately after IV dye injection.  Pt. Then had throat swelling and edema to uvula.     Acetaminophen     Other reaction(s): heart racing   Vicodin [hydrocodone-acetaminophen ] Nausea And Vomiting        Medication List        Accurate as of June 22, 2024 10:59 AM. If you have any questions, ask your nurse or doctor.          abiraterone acetate 250 MG tablet Commonly known as: ZYTIGA Take 1,000 mg by mouth daily. Take on an empty stomach 1 hour before or 2 hours after a meal   amLODipine  10 MG tablet Commonly known as:  NORVASC  Take 1 tablet (10 mg total) by mouth daily.   atorvastatin  10 MG tablet Commonly known as: LIPITOR Take 10 mg by mouth daily.   cetirizine 10 MG tablet Commonly known as: ZYRTEC   methocarbamol 500 MG tablet Commonly known as: ROBAXIN methocarbamol 500 mg tablet  Take 1 tablet every 8 hours by oral route as needed for spasm for 10 days.   predniSONE  10 MG (21) Tbpk tablet Commonly known as: STERAPRED UNI-PAK 21 TAB Take by mouth daily. Take as directed.   silodosin  8 MG Caps capsule Commonly known as: RAPAFLO  Take 1 capsule (8 mg total) by mouth daily with breakfast.        Allergies:  Allergies  Allergen Reactions   Contrast Media [Iodinated Contrast Media] Nausea And Vomiting and Swelling    Pt. Vomited immediately after IV dye injection.  Pt. Then had throat swelling and edema to uvula.     Acetaminophen      Other reaction(s): heart racing   Vicodin [Hydrocodone-Acetaminophen ] Nausea And Vomiting    Family History: Family History  Problem Relation Age of Onset   Prostate cancer Father     Social History:  reports that he quit smoking about 4 years ago. His smoking use included cigarettes and cigars. He started smoking about 30 years ago. He has a 26 pack-year smoking history. He has been exposed to tobacco smoke. He has never used smokeless tobacco. He reports that he does not currently use alcohol. He reports that he does not use drugs.  ROS: All other review of systems were reviewed and are negative except what is noted above in HPI  Physical Exam: BP (!) 143/89   Pulse (!) 101   Constitutional:  Alert and oriented, No acute distress. HEENT: [place] AT, moist mucus membranes.  Trachea midline, no masses. Cardiovascular: No clubbing, cyanosis, or edema. Respiratory: Normal respiratory effort, no increased work of breathing. GI: Abdomen is soft, nontender, nondistended, no abdominal masses GU: No CVA tenderness.  Lymph: No cervical or inguinal  lymphadenopathy. Skin: No rashes, bruises or suspicious lesions. Neurologic: Grossly intact, no focal deficits, moving all 4 extremities. Psychiatric: Normal mood and affect.  Laboratory Data: Lab Results  Component Value Date   WBC 14.1 (H) 12/11/2023   HGB 14.2 12/11/2023   HCT 43.0 12/11/2023   MCV 94.3 12/11/2023   PLT 379 12/11/2023    Lab Results  Component Value Date   CREATININE 0.72 12/11/2023    No results found for: PSA  No results found for: TESTOSTERONE  Lab Results  Component Value Date   HGBA1C 7.0 (H) 05/24/2021    Urinalysis    Component Value Date/Time   COLORURINE YELLOW 12/11/2023 1356   APPEARANCEUR HAZY (A) 12/11/2023 1356   APPEARANCEUR Clear 04/20/2023 1302   LABSPEC 1.035 (H) 12/11/2023 1356  PHURINE 6.0 12/11/2023 1356   GLUCOSEU >=500 (A) 12/11/2023 1356   HGBUR NEGATIVE 12/11/2023 1356   BILIRUBINUR NEGATIVE 12/11/2023 1356   BILIRUBINUR Negative 04/20/2023 1302   KETONESUR NEGATIVE 12/11/2023 1356   PROTEINUR NEGATIVE 12/11/2023 1356   NITRITE NEGATIVE 12/11/2023 1356   LEUKOCYTESUR NEGATIVE 12/11/2023 1356    Lab Results  Component Value Date   LABMICR See below: 04/20/2023   WBCUA 0-5 04/20/2023   LABEPIT 0-10 04/20/2023   BACTERIA NONE SEEN 12/11/2023    Pertinent Imaging:  No results found for this or any previous visit.  No results found for this or any previous visit.  No results found for this or any previous visit.  No results found for this or any previous visit.  No results found for this or any previous visit.  No results found for this or any previous visit.  No results found for this or any previous visit.  No results found for this or any previous visit.   Assessment & Plan:    1. Urinary frequency We will trial trospium  60mg  daily - Urinalysis, Routine w reflex microscopic - BLADDER SCAN AMB NON-IMAGING  2. Prostate cancer (HCC) (Primary) -followup 6 months with PSA  3. OAB (overactive  bladder) -trospium  60mg  daily   No follow-ups on file.  Belvie Clara, MD  [facility] [place]

## 2024-06-22 NOTE — Patient Instructions (Signed)

## 2024-07-05 ENCOUNTER — Other Ambulatory Visit: Payer: Self-pay

## 2024-07-05 MED ORDER — TERAZOSIN HCL 2 MG PO CAPS: 4.0000 mg | ORAL_CAPSULE | Freq: Every day | ORAL | 11 refills | Status: AC

## 2024-10-22 ENCOUNTER — Emergency Department (HOSPITAL_COMMUNITY)
Admission: EM | Admit: 2024-10-22 | Discharge: 2024-10-23 | Disposition: A | Discharge status: Home / Self Care | Attending: Emergency Medicine | Admitting: Emergency Medicine

## 2024-10-22 ENCOUNTER — Other Ambulatory Visit: Payer: Self-pay

## 2024-10-22 ENCOUNTER — Emergency Department (HOSPITAL_COMMUNITY)

## 2024-10-22 ENCOUNTER — Encounter (HOSPITAL_COMMUNITY): Payer: Self-pay | Admitting: Emergency Medicine

## 2024-10-22 DIAGNOSIS — J40 Bronchitis, not specified as acute or chronic: Secondary | ICD-10-CM | POA: Insufficient documentation

## 2024-10-22 DIAGNOSIS — F17200 Nicotine dependence, unspecified, uncomplicated: Secondary | ICD-10-CM | POA: Diagnosis not present

## 2024-10-22 DIAGNOSIS — R059 Cough, unspecified: Secondary | ICD-10-CM | POA: Diagnosis present

## 2024-10-22 NOTE — ED Triage Notes (Signed)
 Pov c/o coughing, headache. Pt was seen at the [facility] in [place] yesterday and was dx with URI and was given abx and cough medicine. Pt states feels worse. Denies n/v/d/f.

## 2024-10-23 LAB — BASIC METABOLIC PANEL WITH GFR
Anion gap: 10 (ref 5–15)
BUN: 14 mg/dL (ref 6–20)
CO2: 26 mmol/L (ref 22–32)
Calcium: 9 mg/dL (ref 8.9–10.3)
Chloride: 101 mmol/L (ref 98–111)
Creatinine, Ser: 0.88 mg/dL (ref 0.61–1.24)
GFR, Estimated: >60 mL/min
Glucose, Bld: 132 mg/dL — ABNORMAL HIGH (ref 70–99)
Potassium: 3.7 mmol/L (ref 3.5–5.1)
Sodium: 138 mmol/L (ref 135–145)

## 2024-10-23 LAB — CBC
HCT: 41.8 % (ref 39.0–52.0)
Hemoglobin: 14 g/dL (ref 13.0–17.0)
MCH: 31.3 pg (ref 26.0–34.0)
MCHC: 33.5 g/dL (ref 30.0–36.0)
MCV: 93.3 fL (ref 80.0–100.0)
Platelets: 385 K/uL (ref 150–400)
RBC: 4.48 MIL/uL (ref 4.22–5.81)
RDW: 15.6 % — ABNORMAL HIGH (ref 11.5–15.5)
WBC: 11.2 K/uL — ABNORMAL HIGH (ref 4.0–10.5)
nRBC: 0 % (ref 0.0–0.2)

## 2024-10-23 LAB — D-DIMER, QUANTITATIVE: D-Dimer, Quant: 0.36 ug{FEU}/mL (ref 0.00–0.50)

## 2024-10-23 LAB — RESP PANEL BY RT-PCR (RSV, FLU A&B, COVID)  RVPGX2
Influenza A by PCR: NEGATIVE
Influenza B by PCR: NEGATIVE
Resp Syncytial Virus by PCR: NEGATIVE
SARS Coronavirus 2 by RT PCR: NEGATIVE

## 2024-10-23 LAB — PRO BRAIN NATRIURETIC PEPTIDE: Pro Brain Natriuretic Peptide: <50 pg/mL

## 2024-10-23 MED ORDER — ALBUTEROL SULFATE HFA 108 (90 BASE) MCG/ACT IN AERS
2.0000 | INHALATION_SPRAY | Freq: Once | RESPIRATORY_TRACT | Status: AC
  Administered 2024-10-23: 2 via RESPIRATORY_TRACT
  Filled 2024-10-23: qty 6.7

## 2024-10-23 MED ORDER — HYDROCODONE BIT-HOMATROP MBR 5-1.5 MG/5ML PO SOLN: 5.0000 mL | Freq: Four times a day (QID) | ORAL | 0 refills | Status: AC | PRN

## 2024-10-23 MED ORDER — AEROCHAMBER PLUS FLO-VU MISC
1.0000 | Freq: Once | Status: AC
  Administered 2024-10-23: 1

## 2024-10-23 MED ORDER — PREDNISONE 20 MG PO TABS: ORAL_TABLET | ORAL | 0 refills | Status: AC

## 2024-10-23 MED ORDER — GUAIFENESIN 100 MG/5ML PO LIQD
5.0000 mL | Freq: Once | ORAL | Status: AC
  Administered 2024-10-23: 5 mL via ORAL
  Filled 2024-10-23: qty 5

## 2024-10-23 MED ORDER — PREDNISONE 50 MG PO TABS
60.0000 mg | ORAL_TABLET | Freq: Once | ORAL | Status: AC
  Administered 2024-10-23: 60 mg via ORAL
  Filled 2024-10-23: qty 1

## 2024-10-23 NOTE — ED Provider Notes (Signed)
 " [place] EMERGENCY DEPARTMENT AT [facility] Provider Note   CSN: [id number] Arrival date & time: 10/22/24  2251     Patient presents with: Flu Like Symptoms   Mark Anthony is a 48 y.o. male.   48 year old male who presents the ER today secondary to likely viral illness.  Patient states for the last day or 2 he has had pretty significant coughing causing quite a bit of chest discomfort.  Especially worse when he lays flat with postnasal drip.  Patient states he feels achy all over intermittent fevers and chills.  He was seen at the [facility] couple days ago and started on antibiotics, Tessalon and nasal saline spray.  Patient states that things have not really improved and the cough medicine does not seem to be working.  He does have a history of smoking but no diagnosed COPD.        Prior to Admission medications  Medication Sig Start Date End Date Taking? Authorizing Provider  HYDROcodone  bit-homatropine (HYCODAN) 5-1.5 MG/5ML syrup Take 5 mLs by mouth every 6 (six) hours as needed for cough. 10/23/24  Yes Veida Spira, Selinda, MD  predniSONE  (DELTASONE ) 20 MG tablet 2 tabs po daily x 4 days 10/23/24  Yes Jona Erkkila, MD  abiraterone acetate (ZYTIGA) 250 MG tablet Take 1,000 mg by mouth daily. Take on an empty stomach 1 hour before or 2 hours after a meal    [provider]  amLODipine  (NORVASC ) 10 MG tablet Take 1 tablet (10 mg total) by mouth daily. 05/24/21 12/24/23  Pearlean Manus, MD  atorvastatin  (LIPITOR) 10 MG tablet Take 10 mg by mouth daily.    [provider]  cetirizine (ZYRTEC) 10 MG tablet  02/27/22   [provider]  silodosin  (RAPAFLO ) 8 MG CAPS capsule Take 1 capsule (8 mg total) by mouth daily with breakfast. 06/22/24   McKenzie, Belvie CROME, MD  terazosin  (HYTRIN ) 2 MG capsule Take 2 capsules (4 mg total) by mouth at bedtime. 07/05/24   McKenzie, Belvie CROME, MD  Trospium  Chloride 60 MG CP24 Take 1 capsule (60 mg total) by mouth daily. 06/22/24    McKenzie, Belvie CROME, MD    Allergies: Contrast media [iodinated contrast media], Acetaminophen , and Vicodin [hydrocodone -acetaminophen ]    Review of Systems  Updated Vital Signs BP (!) 140/78   Pulse (!) 112   Temp 98.8 F (37.1 C) (Oral)   Resp 20   Ht 6' 1 (1.854 m)   Wt 128.4 kg   SpO2 100%   BMI 37.34 kg/m   Physical Exam Vitals and nursing note reviewed.  Constitutional:      Appearance: He is well-developed.  HENT:     Head: Normocephalic and atraumatic.  Cardiovascular:     Rate and Rhythm: Normal rate.  Pulmonary:     Effort: Pulmonary effort is normal. No tachypnea or respiratory distress.     Breath sounds: Decreased air movement present. No wheezing.  Abdominal:     General: There is no distension.  Musculoskeletal:        General: Normal range of motion.     Cervical back: Normal range of motion.  Neurological:     Mental Status: He is alert.     (all labs ordered are listed, but only abnormal results are displayed) Labs Reviewed  BASIC METABOLIC PANEL WITH GFR - Abnormal; Notable for the following components:      Result Value   Glucose, Bld 132 (*)    All other components within  normal limits  CBC - Abnormal; Notable for the following components:   WBC 11.2 (*)    RDW 15.6 (*)    All other components within normal limits  RESP PANEL BY RT-PCR (RSV, FLU A&B, COVID)  [id number]  PRO BRAIN NATRIURETIC PEPTIDE  D-DIMER, QUANTITATIVE    EKG: EKG Interpretation Date/Time:  Sunday October 23 2024 03:04:14 EST Ventricular Rate:  84 PR Interval:  166 QRS Duration:  92 QT Interval:  358 QTC Calculation: 424 R Axis:   80  Text Interpretation: Sinus rhythm Consider anterior infarct Confirmed by [place] ([id number]) on 10/23/2024 3:33:37 AM  Radiology: DG Chest 2 View Result Date: 10/23/2024 EXAM: 2 VIEW(S) XRAY OF THE CHEST 10/22/2024 11:23:34 PM COMPARISON: PET CT dated 03/18/2022. CLINICAL HISTORY: sob FINDINGS: LUNGS AND PLEURA: No focal  pulmonary opacity. No pleural effusion. No pneumothorax. HEART AND MEDIASTINUM: No acute abnormality of the cardiac silhouette. Small hiatal hernia. Comparison to PET CT dated 03/18/2022 shows a moderate sized hiatal hernia. BONES AND SOFT TISSUES: No acute osseous abnormality. IMPRESSION: 1. No acute findings. 2. Small hiatal hernia. Electronically signed by: Amy Guttmann MD 10/23/2024 12:20 AM EST RP Workstation: [id number]     Procedures   Medications Ordered in the ED  albuterol (VENTOLIN HFA) 108 (90 Base) MCG/ACT inhaler 2 puff (2 puffs Inhalation Given 10/23/24 0311)  Aerochamber Plus device 1 each (1 each Other Given 10/23/24 0313)  guaiFENesin (ROBITUSSIN) 100 MG/5ML liquid 5 mL (5 mLs Oral Given 10/23/24 0310)  predniSONE (DELTASONE) tablet 60 mg (60 mg Oral Given 10/23/24 0412)                                    Medical Decision Making Amount and/or Complexity of Data Reviewed Labs: ordered. Radiology: ordered. ECG/medicine tests: ordered.  Risk OTC drugs. Prescription drug management.  Will try an albuterol inhaler to see if that helps open up his lungs if this does seem to help we can do a course of steroids.  He is already on antibiotics although it sounds like a viral sinusitis more than anything.  Will try different cough medicine to see if that helps.  As he does have worsening orthopnea we will get a BNP just to make sure that he does not have any undiagnosed heart failure.  BMP reassuring.  Chest x-ray viewed interpreted by myself without any obvious consolidations or pleural effusions.  Rest of workup reassuring.  Reeval, significant improvement in breath sounds. Improved cough. Sleeping. Feels much better. Already on abx, will add on prednisone/albuterol and otherwise symptomatic/supportive care. PCP follow up if not improving.   Final diagnoses:  Bronchitis    ED Discharge Orders          Ordered    predniSONE (DELTASONE) 20 MG tablet        10/23/24  0405    HYDROcodone bit-homatropine (HYCODAN) 5-1.5 MG/5ML syrup  Every 6 hours PRN        12 /28/25 0405               Autumm Hattery, Selinda, MD 10/23/24 0431  "

## 2024-12-16 ENCOUNTER — Other Ambulatory Visit

## 2024-12-23 ENCOUNTER — Ambulatory Visit: Admitting: Urology
